# Patient Record
Sex: Female | Born: 1965
Health system: Southern US, Community
[De-identification: ages and names within clinical notes are randomized; demographics above are authoritative.]

## PROBLEM LIST (undated history)

## (undated) DIAGNOSIS — M25569 Pain in unspecified knee: Secondary | ICD-10-CM

## (undated) DIAGNOSIS — G4730 Sleep apnea, unspecified: Secondary | ICD-10-CM

## (undated) DIAGNOSIS — C801 Malignant (primary) neoplasm, unspecified: Secondary | ICD-10-CM

## (undated) DIAGNOSIS — F329 Major depressive disorder, single episode, unspecified: Secondary | ICD-10-CM

## (undated) DIAGNOSIS — K219 Gastro-esophageal reflux disease without esophagitis: Secondary | ICD-10-CM

## (undated) DIAGNOSIS — G4733 Obstructive sleep apnea (adult) (pediatric): Secondary | ICD-10-CM

## (undated) DIAGNOSIS — F32A Depression, unspecified: Secondary | ICD-10-CM

## (undated) DIAGNOSIS — E7800 Pure hypercholesterolemia, unspecified: Secondary | ICD-10-CM

## (undated) DIAGNOSIS — M545 Low back pain, unspecified: Secondary | ICD-10-CM

## (undated) HISTORY — PX: OTHER SURGICAL HISTORY: SHX169

## (undated) HISTORY — DX: Obstructive sleep apnea (adult) (pediatric): G47.33

## (undated) HISTORY — DX: Pure hypercholesterolemia, unspecified: E78.00

## (undated) HISTORY — DX: Pain in unspecified knee: M25.569

## (undated) HISTORY — DX: Sleep apnea, unspecified: G47.30

## (undated) HISTORY — PX: COLONOSCOPY: SHX174

## (undated) HISTORY — DX: Low back pain, unspecified: M54.50

---
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
## 1999-02-07 ENCOUNTER — Other Ambulatory Visit: Admission: RE | Admit: 1999-02-07 | Discharge: 1999-02-07 | Payer: Self-pay | Admitting: Obstetrics and Gynecology

## 1999-08-28 ENCOUNTER — Ambulatory Visit (HOSPITAL_COMMUNITY): Admission: RE | Admit: 1999-08-28 | Discharge: 1999-08-28 | Payer: Self-pay | Admitting: *Deleted

## 1999-08-28 ENCOUNTER — Encounter: Payer: Self-pay | Admitting: *Deleted

## 1999-08-29 ENCOUNTER — Inpatient Hospital Stay (HOSPITAL_COMMUNITY): Admission: AD | Admit: 1999-08-29 | Discharge: 1999-09-01 | Payer: Self-pay | Admitting: Obstetrics and Gynecology

## 1999-09-06 ENCOUNTER — Encounter (HOSPITAL_COMMUNITY): Admission: RE | Admit: 1999-09-06 | Discharge: 1999-12-05 | Payer: Self-pay | Admitting: *Deleted

## 1999-10-11 ENCOUNTER — Other Ambulatory Visit: Admission: RE | Admit: 1999-10-11 | Discharge: 1999-10-11 | Payer: Self-pay | Admitting: Obstetrics and Gynecology

## 2000-01-07 ENCOUNTER — Encounter: Admission: RE | Admit: 2000-01-07 | Discharge: 2000-03-09 | Payer: Self-pay | Admitting: Obstetrics and Gynecology

## 2000-12-30 ENCOUNTER — Ambulatory Visit (HOSPITAL_COMMUNITY): Admission: RE | Admit: 2000-12-30 | Discharge: 2000-12-30 | Payer: Self-pay | Admitting: *Deleted

## 2001-01-24 ENCOUNTER — Other Ambulatory Visit: Admission: RE | Admit: 2001-01-24 | Discharge: 2001-01-24 | Payer: Self-pay | Admitting: Obstetrics and Gynecology

## 2001-03-31 ENCOUNTER — Encounter: Admission: RE | Admit: 2001-03-31 | Discharge: 2001-03-31 | Payer: Self-pay | Admitting: *Deleted

## 2001-07-18 ENCOUNTER — Encounter: Admission: RE | Admit: 2001-07-18 | Discharge: 2001-07-18 | Payer: Self-pay | Admitting: *Deleted

## 2001-08-25 ENCOUNTER — Encounter: Admission: RE | Admit: 2001-08-25 | Discharge: 2001-08-25 | Payer: Self-pay | Admitting: *Deleted

## 2001-09-05 ENCOUNTER — Encounter: Admission: RE | Admit: 2001-09-05 | Discharge: 2001-09-05 | Payer: Self-pay | Admitting: Psychiatry

## 2001-09-12 ENCOUNTER — Encounter: Admission: RE | Admit: 2001-09-12 | Discharge: 2001-09-12 | Payer: Self-pay | Admitting: Psychiatry

## 2001-10-01 ENCOUNTER — Encounter: Admission: RE | Admit: 2001-10-01 | Discharge: 2001-10-01 | Payer: Self-pay | Admitting: Psychiatry

## 2001-10-22 ENCOUNTER — Encounter: Admission: RE | Admit: 2001-10-22 | Discharge: 2001-10-22 | Payer: Self-pay | Admitting: Psychiatry

## 2001-11-13 ENCOUNTER — Encounter: Admission: RE | Admit: 2001-11-13 | Discharge: 2001-11-13 | Payer: Self-pay | Admitting: Psychiatry

## 2001-12-05 ENCOUNTER — Encounter: Admission: RE | Admit: 2001-12-05 | Discharge: 2001-12-05 | Payer: Self-pay | Admitting: Psychiatry

## 2002-03-06 ENCOUNTER — Encounter: Admission: RE | Admit: 2002-03-06 | Discharge: 2002-03-06 | Payer: Self-pay | Admitting: Psychiatry

## 2002-05-20 ENCOUNTER — Other Ambulatory Visit: Admission: RE | Admit: 2002-05-20 | Discharge: 2002-05-20 | Payer: Self-pay | Admitting: Obstetrics and Gynecology

## 2003-03-30 ENCOUNTER — Encounter: Admission: RE | Admit: 2003-03-30 | Discharge: 2003-03-30 | Payer: Self-pay | Admitting: Obstetrics and Gynecology

## 2003-05-11 ENCOUNTER — Other Ambulatory Visit: Admission: RE | Admit: 2003-05-11 | Discharge: 2003-05-11 | Payer: Self-pay | Admitting: Obstetrics and Gynecology

## 2004-03-24 ENCOUNTER — Ambulatory Visit (HOSPITAL_COMMUNITY): Admission: RE | Admit: 2004-03-24 | Discharge: 2004-03-24 | Payer: Self-pay | Admitting: Surgery

## 2004-03-24 ENCOUNTER — Encounter: Admission: RE | Admit: 2004-03-24 | Discharge: 2004-06-22 | Payer: Self-pay | Admitting: Surgery

## 2004-03-26 ENCOUNTER — Ambulatory Visit (HOSPITAL_BASED_OUTPATIENT_CLINIC_OR_DEPARTMENT_OTHER): Admission: RE | Admit: 2004-03-26 | Discharge: 2004-03-26 | Payer: Self-pay | Admitting: Surgery

## 2004-03-29 ENCOUNTER — Ambulatory Visit (HOSPITAL_COMMUNITY): Admission: RE | Admit: 2004-03-29 | Discharge: 2004-03-29 | Payer: Self-pay | Admitting: Internal Medicine

## 2004-04-28 ENCOUNTER — Ambulatory Visit: Payer: Self-pay | Admitting: Internal Medicine

## 2004-05-22 ENCOUNTER — Observation Stay (HOSPITAL_COMMUNITY): Admission: RE | Admit: 2004-05-22 | Discharge: 2004-05-23 | Payer: Self-pay | Admitting: Surgery

## 2004-06-30 ENCOUNTER — Encounter: Admission: RE | Admit: 2004-06-30 | Discharge: 2004-09-28 | Payer: Self-pay | Admitting: Surgery

## 2005-01-31 ENCOUNTER — Other Ambulatory Visit: Admission: RE | Admit: 2005-01-31 | Discharge: 2005-01-31 | Payer: Self-pay | Admitting: Obstetrics and Gynecology

## 2005-05-14 ENCOUNTER — Encounter: Admission: RE | Admit: 2005-05-14 | Discharge: 2005-05-14 | Payer: Self-pay | Admitting: Surgery

## 2006-04-23 HISTORY — PX: LAPAROSCOPIC ROUX-EN-Y GASTRIC BYPASS WITH UPPER ENDOSCOPY AND REMOVAL OF LAP BAND: SHX6505

## 2009-12-15 ENCOUNTER — Ambulatory Visit (HOSPITAL_COMMUNITY): Payer: Self-pay | Admitting: Licensed Clinical Social Worker

## 2009-12-28 ENCOUNTER — Ambulatory Visit (HOSPITAL_COMMUNITY): Payer: Self-pay | Admitting: Licensed Clinical Social Worker

## 2010-01-06 ENCOUNTER — Ambulatory Visit (HOSPITAL_COMMUNITY): Payer: Self-pay | Admitting: Licensed Clinical Social Worker

## 2010-01-13 ENCOUNTER — Ambulatory Visit (HOSPITAL_COMMUNITY): Payer: Self-pay | Admitting: Licensed Clinical Social Worker

## 2010-01-30 ENCOUNTER — Ambulatory Visit (HOSPITAL_COMMUNITY): Payer: Self-pay | Admitting: Licensed Clinical Social Worker

## 2010-02-06 ENCOUNTER — Ambulatory Visit (HOSPITAL_COMMUNITY): Payer: Self-pay | Admitting: Licensed Clinical Social Worker

## 2010-02-27 ENCOUNTER — Ambulatory Visit (HOSPITAL_COMMUNITY): Payer: Self-pay | Admitting: Licensed Clinical Social Worker

## 2010-03-13 ENCOUNTER — Ambulatory Visit (HOSPITAL_COMMUNITY): Payer: Self-pay | Admitting: Licensed Clinical Social Worker

## 2010-03-23 ENCOUNTER — Ambulatory Visit (HOSPITAL_COMMUNITY): Payer: Self-pay | Admitting: Licensed Clinical Social Worker

## 2010-04-12 ENCOUNTER — Ambulatory Visit (HOSPITAL_COMMUNITY): Payer: Self-pay | Admitting: Licensed Clinical Social Worker

## 2010-04-18 ENCOUNTER — Ambulatory Visit (HOSPITAL_COMMUNITY): Payer: Self-pay | Admitting: Licensed Clinical Social Worker

## 2010-05-19 ENCOUNTER — Ambulatory Visit (HOSPITAL_COMMUNITY)
Admission: RE | Admit: 2010-05-19 | Discharge: 2010-05-19 | Payer: Self-pay | Source: Home / Self Care | Attending: Licensed Clinical Social Worker | Admitting: Licensed Clinical Social Worker

## 2010-06-02 ENCOUNTER — Encounter (HOSPITAL_COMMUNITY): Payer: Commercial Managed Care - PPO | Admitting: Licensed Clinical Social Worker

## 2010-06-02 DIAGNOSIS — F332 Major depressive disorder, recurrent severe without psychotic features: Secondary | ICD-10-CM

## 2010-06-12 ENCOUNTER — Encounter (HOSPITAL_COMMUNITY): Payer: Self-pay | Admitting: Licensed Clinical Social Worker

## 2010-06-27 ENCOUNTER — Encounter (HOSPITAL_COMMUNITY): Payer: Self-pay | Admitting: Licensed Clinical Social Worker

## 2010-07-03 ENCOUNTER — Encounter (HOSPITAL_COMMUNITY): Payer: Commercial Managed Care - PPO | Admitting: Licensed Clinical Social Worker

## 2010-07-03 DIAGNOSIS — F332 Major depressive disorder, recurrent severe without psychotic features: Secondary | ICD-10-CM

## 2010-07-17 ENCOUNTER — Encounter (HOSPITAL_COMMUNITY): Payer: Commercial Managed Care - PPO | Admitting: Licensed Clinical Social Worker

## 2010-07-17 DIAGNOSIS — F332 Major depressive disorder, recurrent severe without psychotic features: Secondary | ICD-10-CM

## 2010-08-07 ENCOUNTER — Encounter (HOSPITAL_COMMUNITY): Payer: Commercial Managed Care - PPO | Admitting: Licensed Clinical Social Worker

## 2010-08-07 DIAGNOSIS — F332 Major depressive disorder, recurrent severe without psychotic features: Secondary | ICD-10-CM

## 2010-08-28 ENCOUNTER — Encounter (HOSPITAL_COMMUNITY): Payer: Commercial Managed Care - PPO | Admitting: Licensed Clinical Social Worker

## 2010-08-28 DIAGNOSIS — F332 Major depressive disorder, recurrent severe without psychotic features: Secondary | ICD-10-CM

## 2010-09-08 NOTE — Op Note (Signed)
Aimee, Hart              ACCOUNT NO.:  [id number]   MEDICAL RECORD NO.:  [id number]          PATIENT TYPE:  AMB   LOCATION:  DAY                          FACILITY:  [facility]   PHYSICIAN:  Thornton Park. Daphine Deutscher, MD  DATE OF BIRTH:  Sep 01, 1965   DATE OF PROCEDURE:  05/22/2004  DATE OF DISCHARGE:                                 OPERATIVE REPORT   PREOPERATIVE DIAGNOSES:  Morbid obesity, BMI 36.7 with multiple  comorbidities.   POSTOPERATIVE DIAGNOSES:  Morbid obesity, body mass index of 36.7 with  multiple comorbidities.   PROCEDURE:  Laparoscopic placement of 10 cm adjustable gastric band.   SURGEON:  Thornton Park. Daphine Deutscher, M.D.   ASSISTANT:  Sharlet Salina T. Hoxworth, M.D.   ANESTHESIA:  General endotracheal.   ESTIMATED BLOOD LOSS:  40 cc.   INDICATIONS FOR PROCEDURE:  Aimee Hart is a 45 year old patient taken to  room #1 on May 22, 2004.  Preoperatively, I reviewed her upper GI which  purportedly showed a small hiatal hernia.  This was evaluated and based on  that, I committed to look at this hiatus at the time of surgery.   DESCRIPTION OF PROCEDURE:  Abdomen was entered using an Opti-Vu trocar  technique in the left upper quadrant, then placing the standard 2 trocars in  the right to the midline, a 5 mm in the upper midline for liver retraction  and then 11 to just to the left of the umbilicus.  Also, a 5 was placed on  the left side. First the liver was retracted, then I went up and discovered  the left crus, came down on the left crus and made a little nick in the  fascia, and then did some retrogastric dissection there with the finger  dissector.  Next, I went over and opened the pars flaccida, identified the  right crus and the fat pad moving across it.  I then did a little dissection  on the medial margin of the crus and then using the finger, I was able to  pass up behind the stomach and I came out exactly where I had done my  dissection and this came on  through.   Next, the band was introduced using the band introducer through the 10 mm  port and the little fold of skin above the umbilicus on the right side.  This was then brought into the abdomen and it was placed in the band passer  and brought around, and then passed through the buckle, and then the initial  snap was made.  In the meantime, we had passed the sizing device and had  blown up the 20 cc balloon, and pulled it back to where it was abutting the  EG-junction. It appeared that there was no evident hiatal hernia.  This  maneuver was performed and the area was examined again. There did into  appear to be anything to repair. The balloon was then let down and a  catheter brought back into the mid-esophagus.   Band was then passed around and buckled. At that point, we did pass the tube  back through  and it passed easily.  Three free arm sutures were placed into  the abdomen using a Surgidev, pulling the stomach up over to the small pouch  proximally. These were secured with the tie knots.  A good wrap appeared to  be present.  This last suture was satisfactory, this was away from the  buckle. The catheter tip was then brought out through the right 12 mm port,  this ws enlarged and this was placed onto the stem of the port itself,  passed into the abdomen and then a port was sutured  to the fascia with 4 interrupted 2-0 Prolene sutures. This secured it  nicely. The wound was irrigated. All port sites were injected and closed  with 4-0 Vicryl, Benzoin and Steri-Strips. The patient seemed to tolerate  the procedure well, the patient was taken to the recovery room in  satisfactory condition.      MBM/MEDQ  D:  05/22/2004  T:  05/22/2004  Job:  [id number]

## 2010-09-08 NOTE — Procedures (Signed)
Aimee Hart, Aimee Hart              ACCOUNT NO.:  [id number]   MEDICAL RECORD NO.:  [id number]          PATIENT TYPE:  OUT   LOCATION:  SLEEP CENTER                 [facility]ACILITY[facility]   PHYSICIAN:  Clinton D. Maple Hudson, M.D. DATE OF BIRTH:  03-13-1966   DATE OF STUDY:  03/26/2004                              NOCTURNAL POLYSOMNOGRAM   REFERRING PHYSICIAN:  Luretha Murphy, MD   INDICATION FOR STUDY:  Hypersomnia with sleep apnea.   NECK SIZE:  16.5 inches   BMI:  37   WEIGHT:  231 pounds   SLEEP ARCHITECTURE:  Total sleep time 348.5 minutes with sleep efficiency  75%.  Stage I was 34%, stage II 49%, stages III and IV 17%.  REM was absent.  Latency to sleep onset 27 minutes.  Awake after sleep onset 99 minutes.  Arousal index increased at 52.   RESPIRATORY DATA:  Split study protocol.  RDI 94.9 per hour indicating  severe obstructive sleep apnea/hypopnea syndrome before CPAP.  This included  180 obstructive apneas, 1 central apnea and 80 hypopnea's before CPAP.  The  events were not positional.  REM RDI not applicable.  CPAP was titrated to  16 CWP, RDI 3.4 per hour.  Using a small ResMed UltraMirage full face mask  with heated humidifier.   OXYGEN DATA:  Moderate snoring with oxygen desaturation to a nadir of 80%  before CPAP.  After CPAP control, oxygen saturation held 96% to 98% on room  air.   CARDIAC DATA:  Normal sinus rhythm.   MOVEMENTS/PARASOMNIA:  Occasional leg jerks with insignificant effect on  sleep.  Bactrim x1.   IMPRESSION/RECOMMENDATION:  Severe obstructive sleep apnea/hypopnea  syndrome.  RDI 94.9 per hour with desaturation to 81%.  CPAP titration to 16  CWP, RDI 3.4 per hour.  A small ResMed UltraMirage full face mask was used  with heated humidifier.                                                           Clinton D. Maple Hudson, M.D.  Diplomate, [employer] Board  CDY/MEDQ  D:  04/02/2004 11:49:07  T:  04/02/2004 19:05:18  Job:  [id number]

## 2010-09-22 ENCOUNTER — Encounter (HOSPITAL_COMMUNITY): Payer: Commercial Managed Care - PPO | Admitting: Licensed Clinical Social Worker

## 2010-10-04 ENCOUNTER — Encounter (HOSPITAL_COMMUNITY): Payer: Commercial Managed Care - PPO | Admitting: Licensed Clinical Social Worker

## 2010-10-04 DIAGNOSIS — F332 Major depressive disorder, recurrent severe without psychotic features: Secondary | ICD-10-CM

## 2010-10-20 ENCOUNTER — Encounter (HOSPITAL_COMMUNITY): Payer: Commercial Managed Care - PPO | Admitting: Licensed Clinical Social Worker

## 2010-10-27 ENCOUNTER — Encounter (HOSPITAL_COMMUNITY): Payer: 59 | Admitting: Licensed Clinical Social Worker

## 2010-10-27 DIAGNOSIS — F332 Major depressive disorder, recurrent severe without psychotic features: Secondary | ICD-10-CM

## 2010-11-21 ENCOUNTER — Encounter (HOSPITAL_COMMUNITY): Payer: 59 | Admitting: Licensed Clinical Social Worker

## 2011-03-06 ENCOUNTER — Encounter (HOSPITAL_COMMUNITY): Payer: Self-pay | Admitting: Licensed Clinical Social Worker

## 2013-01-26 ENCOUNTER — Encounter (HOSPITAL_COMMUNITY): Payer: Self-pay | Admitting: Emergency Medicine

## 2013-01-26 ENCOUNTER — Emergency Department (HOSPITAL_COMMUNITY)
Admission: EM | Admit: 2013-01-26 | Discharge: 2013-01-26 | Disposition: A | Discharge status: Home / Self Care | Payer: 59 | Source: Home / Self Care | Attending: Family Medicine | Admitting: Family Medicine

## 2013-01-26 DIAGNOSIS — M26609 Unspecified temporomandibular joint disorder, unspecified side: Secondary | ICD-10-CM

## 2013-01-26 NOTE — ED Provider Notes (Signed)
CSN: [id number]     Arrival date & time 01/26/13  1341 History   First MD Initiated Contact with Patient 01/26/13 1426     Chief Complaint  Patient presents with  . Otalgia   (Consider location/radiation/quality/duration/timing/severity/associated sxs/prior Treatment) Patient is a 47 y.o. female presenting with ear pain. The history is provided by the patient.  Otalgia Location:  Right Behind ear:  No abnormality Quality:  Sharp Severity:  Mild Onset quality:  Gradual Duration:  3 weeks Timing:  Intermittent Chronicity:  New Context comment:  Pain in front of right ear. Exacerbated by: chewing. Associated symptoms: no congestion, no ear discharge, no fever, no hearing loss, no neck pain and no rhinorrhea     History reviewed. No pertinent past medical history. No past surgical history on file. No family history on file. History  Substance Use Topics  . Smoking status: Not on file  . Smokeless tobacco: Not on file  . Alcohol Use: Not on file   OB History   Grav Para Term Preterm Abortions TAB SAB Ect Mult Living                 Review of Systems  Constitutional: Negative.  Negative for fever.  HENT: Positive for ear pain. Negative for hearing loss, congestion, rhinorrhea, neck pain, postnasal drip and ear discharge.     Allergies  Review of patient's allergies indicates no known allergies.  Home Medications  No current outpatient prescriptions on file. BP 117/78  Pulse 83  Temp(Src) 98.8 F (37.1 C) (Oral)  Resp 18  SpO2 96%  LMP 01/23/2013 Physical Exam  Nursing note and vitals reviewed. Constitutional: She appears well-developed and well-nourished.  HENT:  Head: Normocephalic.  Right Ear: External ear normal.  Left Ear: External ear normal.  Mouth/Throat: Oropharynx is clear and moist and mucous membranes are normal.      ED Course  Procedures (including critical care time) Labs Review Labs Reviewed - No data to display Imaging Review No results  found.  MDM      Linna Hoff, MD 01/26/13 [phone]

## 2013-01-26 NOTE — ED Notes (Signed)
C/o right ear pain for three weeks.  Heat was used and lemon juice was used as treatments.  Nodes on the right are sore.

## 2014-09-03 ENCOUNTER — Emergency Department (HOSPITAL_COMMUNITY): Payer: 59

## 2014-09-03 ENCOUNTER — Encounter (HOSPITAL_COMMUNITY): Payer: Self-pay | Admitting: Emergency Medicine

## 2014-09-03 ENCOUNTER — Emergency Department (HOSPITAL_COMMUNITY)
Admission: EM | Admit: 2014-09-03 | Discharge: 2014-09-03 | Disposition: A | Discharge status: Home / Self Care | Payer: 59 | Attending: Emergency Medicine | Admitting: Emergency Medicine

## 2014-09-03 DIAGNOSIS — R0789 Other chest pain: Secondary | ICD-10-CM | POA: Diagnosis not present

## 2014-09-03 DIAGNOSIS — K219 Gastro-esophageal reflux disease without esophagitis: Secondary | ICD-10-CM | POA: Insufficient documentation

## 2014-09-03 DIAGNOSIS — Z79899 Other long term (current) drug therapy: Secondary | ICD-10-CM | POA: Diagnosis not present

## 2014-09-03 DIAGNOSIS — R1030 Lower abdominal pain, unspecified: Secondary | ICD-10-CM | POA: Diagnosis present

## 2014-09-03 HISTORY — DX: Gastro-esophageal reflux disease without esophagitis: K21.9

## 2014-09-03 LAB — COMPREHENSIVE METABOLIC PANEL
ALBUMIN: 4 g/dL (ref 3.5–5.0)
ALK PHOS: 46 U/L (ref 38–126)
ALT: 9 U/L — AB (ref 14–54)
AST: 13 U/L — ABNORMAL LOW (ref 15–41)
Anion gap: 10 (ref 5–15)
BUN: 19 mg/dL (ref 6–20)
CO2: 24 mmol/L (ref 22–32)
CREATININE: 1.09 mg/dL — AB (ref 0.44–1.00)
Calcium: 9.3 mg/dL (ref 8.9–10.3)
Chloride: 105 mmol/L (ref 101–111)
GFR calc non Af Amer: 59 mL/min — ABNORMAL LOW (ref >60)
GLUCOSE: 102 mg/dL — AB (ref 65–99)
POTASSIUM: 4.1 mmol/L (ref 3.5–5.1)
Sodium: 139 mmol/L (ref 135–145)
Total Bilirubin: 0.8 mg/dL (ref 0.3–1.2)
Total Protein: 6.6 g/dL (ref 6.5–8.1)

## 2014-09-03 LAB — CBC WITH DIFFERENTIAL/PLATELET
Basophils Absolute: 0.1 10*3/uL (ref 0.0–0.1)
Basophils Relative: 1 % (ref 0–1)
EOS ABS: 0.3 10*3/uL (ref 0.0–0.7)
EOS PCT: 6 % — AB (ref 0–5)
HCT: 39.9 % (ref 36.0–46.0)
Hemoglobin: 13.2 g/dL (ref 12.0–15.0)
Lymphocytes Relative: 28 % (ref 12–46)
Lymphs Abs: 1.5 10*3/uL (ref 0.7–4.0)
MCH: 27 pg (ref 26.0–34.0)
MCHC: 33.1 g/dL (ref 30.0–36.0)
MCV: 81.8 fL (ref 78.0–100.0)
Monocytes Absolute: 0.4 10*3/uL (ref 0.1–1.0)
Monocytes Relative: 8 % (ref 3–12)
Neutro Abs: 3 10*3/uL (ref 1.7–7.7)
Neutrophils Relative %: 57 % (ref 43–77)
PLATELETS: 275 10*3/uL (ref 150–400)
RBC: 4.88 MIL/uL (ref 3.87–5.11)
RDW: 13.4 % (ref 11.5–15.5)
WBC: 5.2 10*3/uL (ref 4.0–10.5)

## 2014-09-03 LAB — URINALYSIS, ROUTINE W REFLEX MICROSCOPIC
Bilirubin Urine: NEGATIVE
Glucose, UA: NEGATIVE mg/dL
Hgb urine dipstick: NEGATIVE
Ketones, ur: NEGATIVE mg/dL
Leukocytes, UA: NEGATIVE
Nitrite: NEGATIVE
Protein, ur: NEGATIVE mg/dL
Specific Gravity, Urine: 1.019 (ref 1.005–1.030)
Urobilinogen, UA: 0.2 mg/dL (ref 0.0–1.0)
pH: 5.5 (ref 5.0–8.0)

## 2014-09-03 LAB — LIPASE, BLOOD: Lipase: 31 U/L (ref 22–51)

## 2014-09-03 NOTE — Discharge Instructions (Signed)
Chest Wall Pain Chest wall pain is pain in or around the bones and muscles of your chest. It may take up to 6 weeks to get better. It may take longer if you must stay physically active in your work and activities.  CAUSES  Chest wall pain may happen on its own. However, it may be caused by:  A viral illness like the flu.  Injury.  Coughing.  Exercise.  Arthritis.  Fibromyalgia.  Shingles. HOME CARE INSTRUCTIONS   Avoid overtiring physical activity. Try not to strain or perform activities that cause pain. This includes any activities using your chest or your abdominal and side muscles, especially if heavy weights are used.  Put ice on the sore area.  Put ice in a plastic bag.  Place a towel between your skin and the bag.  Leave the ice on for 15-20 minutes per hour while awake for the first 2 days.  Only take over-the-counter or prescription medicines for pain, discomfort, or fever as directed by your caregiver. SEEK IMMEDIATE MEDICAL CARE IF:   Your pain increases, or you are very uncomfortable.  You have a fever.  Your chest pain becomes worse.  You have new, unexplained symptoms.  You have nausea or vomiting.  You feel sweaty or lightheaded.  You have a cough with phlegm (sputum), or you cough up blood. MAKE SURE YOU:   Understand these instructions.  Will watch your condition.  Will get help right away if you are not doing well or get worse. Document Released: 04/09/2005 Document Revised: 07/02/2011 Document Reviewed: 12/04/2010 [facility] Patient Information 2015 [place], [place]. This information is not intended to replace advice given to you by your health care provider. Make sure you discuss any questions you have with your health care provider.  Please monitor for new or worsening signs or symptoms, return to the emergency room if any present. Please follow-up to primary care provider if symptoms continue to persist. Please use ibuprofen 600 3 times daily  for 5 days as needed for pain.

## 2014-09-03 NOTE — ED Notes (Signed)
PA at bedside.

## 2014-09-03 NOTE — ED Notes (Addendum)
Pt transported to Xray. 

## 2014-09-03 NOTE — ED Provider Notes (Signed)
CSN: [id number]     Arrival date & time 09/03/14  0756 History   None    Chief Complaint  Patient presents with  . Abdominal Pain   HPI   49 year old female presents today with pain to her right inferior rib and flank area. Patient reports the pain started on Wednesday and was not associated with any activity or event. She describes it as "achy" with some radiation towards her epigastric area. Patient denies aggravating or relieving factors including deep inspiration or positioning. Patient denies nausea, vomiting, diarrhea, chest pain, shortness of breath, diaphoresis, additional abdominal pain, changes in her urinary or bowel frequency or characteristics, lower extremity swelling or edema, recent surgeries, prolonged immobilization, estrogen use, coagulopathies, smoking.  Past Medical History  Diagnosis Date  . GERD (gastroesophageal reflux disease)    History reviewed. No pertinent past surgical history. History reviewed. No pertinent family history. History  Substance Use Topics  . Smoking status: Not on file  . Smokeless tobacco: Not on file  . Alcohol Use: Not on file   OB History    No data available     Review of Systems  All other systems reviewed and are negative.   Allergies  Review of patient's allergies indicates no known allergies.  Home Medications   Prior to Admission medications   Medication Sig Start Date End Date Taking? Authorizing Provider  buPROPion (WELLBUTRIN XL) 300 MG 24 hr tablet Take 300 mg by mouth daily.   Yes Historical Provider, MD  escitalopram (LEXAPRO) 20 MG tablet Take 30 mg by mouth daily.   Yes Historical Provider, MD  ibuprofen (ADVIL,MOTRIN) 200 MG tablet Take 600 mg by mouth every 6 (six) hours as needed for headache, mild pain or moderate pain.   Yes Historical Provider, MD  omeprazole (PRILOSEC OTC) 20 MG tablet Take 20 mg by mouth daily.   Yes Historical Provider, MD   BP 141/83 mmHg  Pulse 72  Temp(Src) 98.3 F (36.8 C) (Oral)   Resp 16  SpO2 99%  LMP 07/24/2014 Physical Exam  Constitutional: She is oriented to person, place, and time. She appears well-developed and well-nourished.  HENT:  Head: Normocephalic and atraumatic.  Eyes: Pupils are equal, round, and reactive to light.  Neck: Normal range of motion. Neck supple. No JVD present. No tracheal deviation present. No thyromegaly present.  Cardiovascular: Normal rate, regular rhythm, normal heart sounds and intact distal pulses.  Exam reveals no gallop and no friction rub.   No murmur heard. Pulmonary/Chest: Effort normal and breath sounds normal. No stridor. No respiratory distress. She has no wheezes. She has no rales. She exhibits no tenderness.  Nontender to palpation of chest wall  Abdominal: There is no hepatosplenomegaly, splenomegaly or hepatomegaly. There is no tenderness. There is no rigidity, no rebound, no guarding, no CVA tenderness, no tenderness at McBurney's point and negative Murphy's sign.  Mass in right lower quadrant, status post gastric banding  Musculoskeletal: Normal range of motion.  Lymphadenopathy:    She has no cervical adenopathy.  Neurological: She is alert and oriented to person, place, and time. Coordination normal.  Skin: Skin is warm and dry.  Psychiatric: She has a normal mood and affect. Her behavior is normal. Judgment and thought content normal.  Nursing note and vitals reviewed.   ED Course  Procedures (including critical care time) Labs Review Labs Reviewed  CBC WITH DIFFERENTIAL/PLATELET - Abnormal; Notable for the following:    Eosinophils Relative 6 (*)    All other components  within normal limits  COMPREHENSIVE METABOLIC PANEL - Abnormal; Notable for the following:    Glucose, Bld 102 (*)    Creatinine, Ser 1.09 (*)    AST 13 (*)    ALT 9 (*)    GFR calc non Af Amer 59 (*)    All other components within normal limits  URINALYSIS, ROUTINE W REFLEX MICROSCOPIC - Abnormal; Notable for the following:     APPearance CLOUDY (*)    All other components within normal limits  LIPASE, BLOOD    Imaging Review No results found.   EKG Interpretation None      MDM   Final diagnoses:  Right-sided chest wall pain    Labs: Urinalysis, CBC, CMP, lipase- noncontributory  Imaging: DG chest no active cardiopulmonary disease  Consults: None  Therapeutics: None  Assessment: Chest wall pain  Plan: Patient presents with chest wall pain. Patient is nontender to palpation to the abdomen, and likely referred pain. Patient is perk negative. She remained stable throughout her stay was not able to reproduce pain, she was not tachycardic, hypoxic, acute neck and had no fever. This is unlikely cardiopulmonary related. She was instructed to use ibuprofen for discomfort and encouraged to monitor for new or worsening signs or symptoms. She is encouraged follow-up immediately in the emergency room if any present. Follow-up with primary care for further evaluation and management of symptoms persist. Patient verbalized understanding to today's plan and had no further questions, and at time of discharge.       Okey Regal, PA-C 09/05/14 [place], DO 09/08/14 1110

## 2014-09-03 NOTE — ED Notes (Signed)
Pt with Hx of GERD c/o right side pain radiating to right flank and right epigastric area onset Wednesday. Pt denies n/v/diarrhea.

## 2015-04-29 DIAGNOSIS — Z01419 Encounter for gynecological examination (general) (routine) without abnormal findings: Secondary | ICD-10-CM | POA: Diagnosis not present

## 2015-04-29 DIAGNOSIS — Z1231 Encounter for screening mammogram for malignant neoplasm of breast: Secondary | ICD-10-CM | POA: Diagnosis not present

## 2015-04-29 DIAGNOSIS — Z6830 Body mass index (BMI) 30.0-30.9, adult: Secondary | ICD-10-CM | POA: Diagnosis not present

## 2015-05-24 DIAGNOSIS — F339 Major depressive disorder, recurrent, unspecified: Secondary | ICD-10-CM | POA: Diagnosis not present

## 2015-05-26 MED FILL — ESCITALOPRAM 20 MG TABLET: 20 | 30 days supply | Qty: 60 | Fill #2

## 2015-06-30 MED FILL — BUPROPION HCL XL 150 MG TAB: 150 | 90 days supply | Qty: 270 | Fill #0

## 2015-06-30 MED FILL — ESCITALOPRAM 20 MG TABLET: 20 | 90 days supply | Qty: 180 | Fill #0

## 2015-07-06 MED FILL — DEXILANT DR 60 MG CAPSULE: 60 | 90 days supply | Qty: 90 | Fill #1

## 2015-10-05 DIAGNOSIS — F339 Major depressive disorder, recurrent, unspecified: Secondary | ICD-10-CM | POA: Diagnosis not present

## 2015-10-06 MED FILL — DEXILANT DR 60 MG CAPSULE: 60 | 60 days supply | Qty: 60 | Fill #2

## 2015-10-07 MED FILL — ALPRAZolam 0.5 MG TABS: 0.5 | 15 days supply | Qty: 60 | Fill #0

## 2015-10-24 DIAGNOSIS — L219 Seborrheic dermatitis, unspecified: Secondary | ICD-10-CM | POA: Diagnosis not present

## 2015-10-24 DIAGNOSIS — L719 Rosacea, unspecified: Secondary | ICD-10-CM | POA: Diagnosis not present

## 2015-10-24 MED FILL — metroNIDAZOLE 0.75 % GEL: 0.75 | 14 days supply | Qty: 45 | Fill #0

## 2015-10-24 MED FILL — FLUOCINONIDE 0.05% SOLUTION: 0.05 | 30 days supply | Qty: 60 | Fill #0

## 2015-11-07 MED FILL — BUPROPION HCL XL 150 MG TAB: 150 | 90 days supply | Qty: 270 | Fill #0

## 2015-11-07 MED FILL — ESCITALOPRAM 20 MG TABLET: 20 | 90 days supply | Qty: 180 | Fill #0

## 2015-11-16 DIAGNOSIS — F339 Major depressive disorder, recurrent, unspecified: Secondary | ICD-10-CM | POA: Diagnosis not present

## 2015-12-08 MED FILL — DEXILANT DR 60 MG CAPSULE: 60 | 30 days supply | Qty: 30 | Fill #0

## 2016-01-26 DIAGNOSIS — K5904 Chronic idiopathic constipation: Secondary | ICD-10-CM | POA: Diagnosis not present

## 2016-01-26 DIAGNOSIS — E669 Obesity, unspecified: Secondary | ICD-10-CM | POA: Diagnosis not present

## 2016-01-26 DIAGNOSIS — K219 Gastro-esophageal reflux disease without esophagitis: Secondary | ICD-10-CM | POA: Diagnosis not present

## 2016-01-26 DIAGNOSIS — K5730 Diverticulosis of large intestine without perforation or abscess without bleeding: Secondary | ICD-10-CM | POA: Diagnosis not present

## 2016-02-22 MED FILL — DEXILANT DR 60 MG CAPSULE: 60 | 90 days supply | Qty: 90 | Fill #0

## 2016-02-23 MED FILL — ALPRAZolam 0.5 MG TABS: 0.5 | 15 days supply | Qty: 60 | Fill #0

## 2016-03-08 DIAGNOSIS — F339 Major depressive disorder, recurrent, unspecified: Secondary | ICD-10-CM | POA: Diagnosis not present

## 2016-03-19 MED FILL — BUPROPION HCL XL 150 MG TAB: 150 | 90 days supply | Qty: 270 | Fill #1

## 2016-04-17 MED FILL — ESCITALOPRAM 20 MG TABLET: 20 | 90 days supply | Qty: 180 | Fill #1

## 2016-05-22 DIAGNOSIS — Z6832 Body mass index (BMI) 32.0-32.9, adult: Secondary | ICD-10-CM | POA: Diagnosis not present

## 2016-05-22 DIAGNOSIS — Z1231 Encounter for screening mammogram for malignant neoplasm of breast: Secondary | ICD-10-CM | POA: Diagnosis not present

## 2016-05-22 DIAGNOSIS — Z01419 Encounter for gynecological examination (general) (routine) without abnormal findings: Secondary | ICD-10-CM | POA: Diagnosis not present

## 2016-07-17 MED FILL — ESCITALOPRAM 20 MG TABLET: 20 | 90 days supply | Qty: 180 | Fill #0

## 2016-07-17 MED FILL — BUPROPION XL 150 MG TAB: 150 | 90 days supply | Qty: 270 | Fill #0

## 2016-10-10 DIAGNOSIS — F339 Major depressive disorder, recurrent, unspecified: Secondary | ICD-10-CM | POA: Diagnosis not present

## 2016-12-04 MED FILL — buPROPion HCL ER (XL) 300 M: 300 | 90 days supply | Qty: 90 | Fill #0

## 2016-12-04 MED FILL — ALPRAZolam 0.5 MG TABS: 0.5 | 15 days supply | Qty: 60 | Fill #0

## 2016-12-04 MED FILL — ESCITALOPRAM 20 MG TABLET: 20 | 90 days supply | Qty: 180 | Fill #0

## 2017-01-11 DIAGNOSIS — J3489 Other specified disorders of nose and nasal sinuses: Secondary | ICD-10-CM | POA: Diagnosis not present

## 2017-03-20 MED FILL — BUPROPION HCL XL 150 MG TAB: 150 | 90 days supply | Qty: 270 | Fill #0

## 2017-04-22 MED FILL — ESCITALOPRAM 20 MG TABLET: 20 | 90 days supply | Qty: 180 | Fill #1

## 2017-05-02 DIAGNOSIS — F339 Major depressive disorder, recurrent, unspecified: Secondary | ICD-10-CM | POA: Diagnosis not present

## 2017-06-04 DIAGNOSIS — Z01419 Encounter for gynecological examination (general) (routine) without abnormal findings: Secondary | ICD-10-CM | POA: Diagnosis not present

## 2017-06-04 DIAGNOSIS — Z6831 Body mass index (BMI) 31.0-31.9, adult: Secondary | ICD-10-CM | POA: Diagnosis not present

## 2017-06-04 DIAGNOSIS — Z1231 Encounter for screening mammogram for malignant neoplasm of breast: Secondary | ICD-10-CM | POA: Diagnosis not present

## 2017-06-10 DIAGNOSIS — Z13220 Encounter for screening for lipoid disorders: Secondary | ICD-10-CM | POA: Diagnosis not present

## 2017-06-10 DIAGNOSIS — Z13228 Encounter for screening for other metabolic disorders: Secondary | ICD-10-CM | POA: Diagnosis not present

## 2017-06-10 DIAGNOSIS — Z1329 Encounter for screening for other suspected endocrine disorder: Secondary | ICD-10-CM | POA: Diagnosis not present

## 2017-06-10 DIAGNOSIS — Z1321 Encounter for screening for nutritional disorder: Secondary | ICD-10-CM | POA: Diagnosis not present

## 2017-06-11 MED FILL — VIT D2 1.25 MG (50,000 UNIT: 1.25 MG | 42 days supply | Qty: 6 | Fill #0

## 2017-08-26 MED FILL — buPROPion HCL ER (XL) 150 M: 150 | 90 days supply | Qty: 270 | Fill #1

## 2017-08-27 MED FILL — ESCITALOPRAM 20 MG TABLET: 20 | 90 days supply | Qty: 180 | Fill #0

## 2017-12-26 DIAGNOSIS — F339 Major depressive disorder, recurrent, unspecified: Secondary | ICD-10-CM | POA: Diagnosis not present

## 2018-01-01 MED FILL — TRINTELLIX 10 MG TABLET: 10 | 30 days supply | Qty: 30 | Fill #0

## 2018-01-01 MED FILL — buPROPion HCL ER (XL) 300 M: 300 | 90 days supply | Qty: 90 | Fill #0

## 2018-01-01 MED FILL — ALPRAZolam 0.5 MG TABS: 0.5 | 30 days supply | Qty: 60 | Fill #0

## 2018-01-14 ENCOUNTER — Emergency Department: Payer: 59

## 2018-01-14 ENCOUNTER — Emergency Department
Admission: EM | Admit: 2018-01-14 | Discharge: 2018-01-14 | Disposition: A | Discharge status: Home / Self Care | Payer: 59 | Attending: Emergency Medicine | Admitting: Emergency Medicine

## 2018-01-14 ENCOUNTER — Encounter: Payer: Self-pay | Admitting: Medical Oncology

## 2018-01-14 DIAGNOSIS — M7918 Myalgia, other site: Secondary | ICD-10-CM | POA: Diagnosis present

## 2018-01-14 DIAGNOSIS — R0602 Shortness of breath: Secondary | ICD-10-CM | POA: Diagnosis not present

## 2018-01-14 DIAGNOSIS — B349 Viral infection, unspecified: Secondary | ICD-10-CM | POA: Insufficient documentation

## 2018-01-14 DIAGNOSIS — R5383 Other fatigue: Secondary | ICD-10-CM | POA: Insufficient documentation

## 2018-01-14 DIAGNOSIS — Z79899 Other long term (current) drug therapy: Secondary | ICD-10-CM | POA: Diagnosis not present

## 2018-01-14 HISTORY — DX: Depression, unspecified: F32.A

## 2018-01-14 HISTORY — DX: Major depressive disorder, single episode, unspecified: F32.9

## 2018-01-14 LAB — CBC
HCT: 40.9 % (ref 35.0–47.0)
Hemoglobin: 13.9 g/dL (ref 12.0–16.0)
MCH: 27.5 pg (ref 26.0–34.0)
MCHC: 33.9 g/dL (ref 32.0–36.0)
MCV: 81.3 fL (ref 80.0–100.0)
Platelets: 298 10*3/uL (ref 150–440)
RBC: 5.03 MIL/uL (ref 3.80–5.20)
RDW: 14.3 % (ref 11.5–14.5)
WBC: 11 10*3/uL (ref 3.6–11.0)

## 2018-01-14 LAB — FIBRIN DERIVATIVES D-DIMER (ARMC ONLY): Fibrin derivatives D-dimer (ARMC): 358.01 ng/mL (FEU) (ref 0.00–499.00)

## 2018-01-14 LAB — COMPREHENSIVE METABOLIC PANEL
ALK PHOS: 52 U/L (ref 38–126)
ALT: 10 U/L (ref 0–44)
ANION GAP: 7 (ref 5–15)
AST: 17 U/L (ref 15–41)
Albumin: 4.3 g/dL (ref 3.5–5.0)
BUN: 19 mg/dL (ref 6–20)
CALCIUM: 8.6 mg/dL — AB (ref 8.9–10.3)
CO2: 24 mmol/L (ref 22–32)
Chloride: 106 mmol/L (ref 98–111)
Creatinine, Ser: 0.91 mg/dL (ref 0.44–1.00)
GFR calc non Af Amer: >60 mL/min (ref >60)
Glucose, Bld: 99 mg/dL (ref 70–99)
POTASSIUM: 3.9 mmol/L (ref 3.5–5.1)
Sodium: 137 mmol/L (ref 135–145)
Total Bilirubin: 0.6 mg/dL (ref 0.3–1.2)
Total Protein: 7.5 g/dL (ref 6.5–8.1)

## 2018-01-14 LAB — INFLUENZA PANEL BY PCR (TYPE A & B)
INFLAPCR: NEGATIVE
INFLBPCR: NEGATIVE

## 2018-01-14 LAB — TROPONIN I: Troponin I: <0.03 ng/mL (ref <0.03)

## 2018-01-14 MED ORDER — IBUPROFEN 400 MG PO TABS
400.0000 mg | ORAL_TABLET | Freq: Once | ORAL | Status: AC
  Administered 2018-01-14: 400 mg via ORAL
  Filled 2018-01-14: qty 1

## 2018-01-14 NOTE — ED Triage Notes (Signed)
Pt reports that she woke up this am around 0400 with sob. States that she feels like she cant get a deep breath. Pt denies pain, states that she just flew back from [facility] yesterday. Pt also reports body aches and chills.

## 2018-01-14 NOTE — ED Notes (Signed)
Patient transported to X-ray 

## 2018-01-14 NOTE — ED Notes (Signed)
ED Provider at bedside. 

## 2018-01-14 NOTE — ED Provider Notes (Signed)
[facility] Emergency Department Provider Note   ____________________________________________    I have reviewed the triage vital signs and the nursing notes.   HISTORY  Chief Complaint Shortness of Breath     HPI Aimee Hart is a 52 y.o. female who presents with complaints of body aches, shortness of breath and fatigue.  Patient notes that she woke up this morning at 4 AM to go to the bathroom, felt that she had a hard time catching her breath and that her breathing may have been faster than normal, initially concerned that it may be anxiety took a Xanax without relief.  Recently returned from [place], no international travel.  While in [place] had an episode when she became nauseated, dizzy and fatigued which passed relatively quickly.  This morning she tried to go to work but felt very fatigued and decided to come to the emergency department.  She complains of significant myalgias.  No significant cough.  No chest pain.  No calf pain or swelling.  She has received her flu shot.  She does not smoke   Past Medical History:  Diagnosis Date  . Depression   . GERD (gastroesophageal reflux disease)     There are no active problems to display for this patient.   No past surgical history on file.  Prior to Admission medications   Medication Sig Start Date End Date Taking? Authorizing Provider  ALPRAZolam Duanne Moron) 0.5 MG tablet Take 0.5 mg by mouth 2 (two) times daily as needed for anxiety. 01/01/18  Yes [provider]  buPROPion (WELLBUTRIN XL) 300 MG 24 hr tablet Take 300 mg by mouth daily.   Yes [provider]  omeprazole (PRILOSEC OTC) 20 MG tablet Take 20 mg by mouth daily.   Yes [provider]  TRINTELLIX 10 MG TABS tablet Take 10 mg by mouth daily. 01/01/18  Yes [provider]  escitalopram (LEXAPRO) 20 MG tablet Take 30 mg by mouth daily.    [provider]  ibuprofen (ADVIL,MOTRIN) 200 MG tablet  Take 600 mg by mouth every 6 (six) hours as needed for headache, mild pain or moderate pain.    [provider]     Allergies Patient has no known allergies.  No family history on file.  Social History Social History   Tobacco Use  . Smoking status: Not on file  Substance Use Topics  . Alcohol use: Not on file  . Drug use: Not on file    Review of Systems  Constitutional: No fever/chills Eyes: No visual changes.  ENT: No sore throat. Cardiovascular: Denies chest pain. Respiratory: As above Gastrointestinal: No abdominal pain Genitourinary: Negative for dysuria. Musculoskeletal: Myalgias as above Skin: Negative for rash. Neurological: Negative for headaches   ____________________________________________   PHYSICAL EXAM:  VITAL SIGNS: ED Triage Vitals  Enc Vitals Group     BP 01/14/18 0713 119/70     Pulse Rate 01/14/18 0713 100     Resp 01/14/18 0713 18     Temp 01/14/18 0713 97.7 F (36.5 C)     Temp Source 01/14/18 0713 Oral     SpO2 01/14/18 0713 100 %     Weight 01/14/18 0714 85.3 kg (188 lb)     Height 01/14/18 0714 1.651 m (5\' 5" )     Head Circumference --      Peak Flow --      Pain Score 01/14/18 0713 0     Pain Loc --  Pain Edu? --      Excl. in [place]? --     Constitutional: Alert and oriented. No acute distress. Pleasant and interactive Eyes: Conjunctivae are normal.   Nose: No congestion/rhinnorhea. Mouth/Throat: Mucous membranes are moist.   Neck:  Painless ROM Cardiovascular: Normal rate, regular rhythm. Grossly normal heart sounds.  Good peripheral circulation. Respiratory: Normal respiratory effort.  No retractions. Lungs CTAB. Gastrointestinal: No distention.    Musculoskeletal: No lower extremity tenderness nor edema.  Warm and well perfused Neurologic:  Normal speech and language. No gross focal neurologic deficits are appreciated.  Skin:  Skin is warm, dry and intact. No rash noted. Psychiatric: Mood and affect are  normal. Speech and behavior are normal.  ____________________________________________   LABS (all labs ordered are listed, but only abnormal results are displayed)  Labs Reviewed  COMPREHENSIVE METABOLIC PANEL - Abnormal; Notable for the following components:      Result Value   Calcium 8.6 (*)    All other components within normal limits  CBC  TROPONIN I  INFLUENZA PANEL BY PCR (TYPE A & B)  FIBRIN DERIVATIVES D-DIMER ([facility] ONLY)   ____________________________________________  EKG  ED ECG REPORT I, Lavonia Drafts, the attending physician, personally viewed and interpreted this ECG.  Date: 01/14/2018  Rhythm: normal sinus rhythm QRS Axis: normal Intervals: normal ST/T Wave abnormalities: normal Narrative Interpretation: no evidence of acute ischemia  ____________________________________________  RADIOLOGY  Chest x-ray normal ____________________________________________   PROCEDURES  Procedure(s) performed: No  Procedures   Critical Care performed: No ____________________________________________   INITIAL IMPRESSION / ASSESSMENT AND PLAN / ED COURSE  Pertinent labs & imaging results that were available during my care of the patient were reviewed by me and considered in my medical decision making (see chart for details).  Patient presents with primary complaint of myalgias, fatigue, possibly some shortness of breath.  Recent short plane trip, differential does include PE however unlikely given main complaint appears to be myalgias and fatigue suggesting possible viral illness/influenza.  Pneumonia is also possibility.  Pending labs including troponin, chest x-ray, influenza, d-dimer  Chest x-ray does not demonstrate a pneumonia.  Lab work is reassuring, normal white blood cell count, normal troponin.  Influenza is negative, pending d-dimer  ----------------------------------------- 10:22 AM on 01/14/2018 -----------------------------------------  D-dimer  is normal.  Patient reports that her myalgias have resolved after Motrin and her breathing is normal and at baseline.  We discussed close outpatient follow-up and strict return precautions ____________________________________________   FINAL CLINICAL IMPRESSION(S) / ED DIAGNOSES  Final diagnoses:  SOB (shortness of breath)  Viral illness        Note:  This document was prepared using Dragon voice recognition software and may include unintentional dictation errors.    Lavonia Drafts, MD 01/14/18 1023

## 2018-01-27 DIAGNOSIS — F411 Generalized anxiety disorder: Secondary | ICD-10-CM | POA: Insufficient documentation

## 2018-01-27 DIAGNOSIS — F339 Major depressive disorder, recurrent, unspecified: Secondary | ICD-10-CM | POA: Insufficient documentation

## 2018-02-06 ENCOUNTER — Ambulatory Visit (INDEPENDENT_AMBULATORY_CARE_PROVIDER_SITE_OTHER): Payer: 59 | Admitting: Physician Assistant

## 2018-02-06 ENCOUNTER — Encounter: Payer: Self-pay | Admitting: Physician Assistant

## 2018-02-06 DIAGNOSIS — F331 Major depressive disorder, recurrent, moderate: Secondary | ICD-10-CM | POA: Diagnosis not present

## 2018-02-06 DIAGNOSIS — F411 Generalized anxiety disorder: Secondary | ICD-10-CM | POA: Diagnosis not present

## 2018-02-06 MED ORDER — TRINTELLIX 10 MG PO TABS
10.0000 mg | ORAL_TABLET | Freq: Every day | ORAL | 0 refills | Status: DC
Start: 2018-02-06 — End: 2018-06-11

## 2018-02-06 NOTE — Progress Notes (Signed)
[facility]  Patient ID: Aimee Hart,  MRN: [id number]  PCP: Patient, No Pcp Per  Date of Evaluation: 02/06/2018 Time spent:15 minutes   HISTORY/CURRENT STATUS: HPI Aimee Hart is here for a 6-week medication check.  At the last visit in early September we changed Lexapro to Trintellix.  She states she is doing really well and had no trouble during the transition. Denies anhedonia, decreased energy or motivation, not isolating.  Not crying easily. she sleeps well.  He does wake up occasionally but thinks that is probably related to menopause with some hot flashes every once in a while.  Sleeps good now but occasionally wakes up.  Thinks that's probably menopause. Anxiety is well controlled.  She has to take Xanax may be once or twice a week, which is helpful. Work is going well however she is looking for something that is more suitable to her needs right now.  She is an Therapist, sports. She has recently bought a house and moved in this week.  Is going well.  She is also dating a new guy is very happy with that. The Trintellix was $200 even with the co-pay card and her insurance.  States she is willing to pay that because she feels so much better on it but wonders if there is anything else that can be done to get the cost down.   Individual Medical History/ Review of Systems: Changes? :No  Allergies: Patient has no known allergies.  Current Medications:  Current Outpatient Medications:  .  ALPRAZolam (XANAX) 0.5 MG tablet, Take 0.5 mg by mouth 2 (two) times daily as needed for anxiety., Disp: , Rfl: 1 .  buPROPion (WELLBUTRIN XL) 300 MG 24 hr tablet, Take 300 mg by mouth every morning. , Disp: , Rfl:  .  ibuprofen (ADVIL,MOTRIN) 200 MG tablet, Take 600 mg by mouth every 6 (six) hours as needed for headache, mild pain or moderate pain., Disp: , Rfl:  .  omeprazole (PRILOSEC OTC) 20 MG tablet, Take 20 mg by mouth daily., Disp: , Rfl:  .  TRINTELLIX 10 MG TABS tablet, Take 1 tablet (10 mg  total) by mouth daily., Disp: 90 tablet, Rfl: 0 Medication Side Effects: None    Family Medical/ Social History: Changes?  has moved into a new home MENTAL HEALTH EXAM:  There were no vitals taken for this visit.There is no height or weight on file to calculate BMI.  General Appearance: Well Groomed  Eye Contact:  Good  Speech:  Clear and Coherent  Volume:  Normal  Mood:  Euthymic  Affect:  Appropriate  Thought Process:  Goal Directed  Orientation:  Full (Time, Place, and Person)  Thought Content: Logical   Suicidal Thoughts:  No  Homicidal Thoughts:  No  Memory:  Immediate  Judgement:  Good  Insight:  Good  Psychomotor Activity:  Normal  Concentration:  Concentration: Good  Recall:  Good  Fund of Knowledge: Good  Language: Good  Akathisia:  No  AIMS (if indicated): not done  Assets:  Desire for Improvement  ADL's:  Intact  Cognition: WNL  Prognosis:  Good    DIAGNOSES:    ICD-10-CM   1. Major depressive disorder, recurrent episode, moderate (HCC) F33.1   2. Generalized anxiety disorder F41.1     RECOMMENDATIONS: Glad to see her doing so well! Continue Trintellix 10 mg daily.  As far as the cost goes, I have asked her to discuss with the pharmacist whether prior authorization would be helpful.  As  far as I know, our office was not sent anything for [place] to complete.  However, the issue may be insurance, high deductible, high tier of the drug or other factors that I am not aware of.  She understands. Continue Wellbutrin XL 300 mg every morning. Continue Xanax 0.5 mg twice daily as needed. Return in 3 months or sooner as needed    Donnal Moat, PA-C

## 2018-02-25 MED FILL — TRINTELLIX 10 MG TABLET: 10 | 90 days supply | Qty: 90 | Fill #0

## 2018-04-17 ENCOUNTER — Ambulatory Visit: Payer: Self-pay | Admitting: Family

## 2018-04-24 ENCOUNTER — Ambulatory Visit: Payer: Self-pay | Admitting: Family Medicine

## 2018-04-24 MED FILL — buPROPion HCL ER (XL) 300 M: 300 | 90 days supply | Qty: 90 | Fill #1

## 2018-05-05 ENCOUNTER — Encounter: Payer: Self-pay | Admitting: Physician Assistant

## 2018-05-05 ENCOUNTER — Ambulatory Visit (INDEPENDENT_AMBULATORY_CARE_PROVIDER_SITE_OTHER): Payer: 59 | Admitting: Physician Assistant

## 2018-05-05 DIAGNOSIS — F411 Generalized anxiety disorder: Secondary | ICD-10-CM | POA: Diagnosis not present

## 2018-05-05 DIAGNOSIS — F331 Major depressive disorder, recurrent, moderate: Secondary | ICD-10-CM | POA: Diagnosis not present

## 2018-05-05 NOTE — Progress Notes (Signed)
[facility]  Patient ID: Aimee Hart,  MRN: [id number]  PCP: Patient, No Pcp Per  Date of Evaluation: 05/05/2018 Time spent:15 minutes  Chief Complaint:   HISTORY/CURRENT STATUS: HPI For 3 month med check.   Doing really well.  Feels that her medications are working great. Patient denies loss of interest in usual activities and is able to enjoy things.  Denies decreased energy or motivation.  Appetite has not changed.  No extreme sadness, tearfulness, or feelings of hopelessness.  Denies any changes in concentration, making decisions or remembering things.  Denies suicidal or homicidal thoughts.  She is dating a new guy and they went to [place] recently and had a great time.  She is very happy.  Anxiety is well controlled.  She sleeps well most of the time.  Individual Medical History/ Review of Systems: Changes? :No    Past medications for mental health diagnoses include: Zoloft, Prozac, Paxil, Abilify, Effexor, Celexa, Cymbalta, Lamictal  Allergies: Patient has no known allergies.  Current Medications:  Current Outpatient Medications:  .  ALPRAZolam (XANAX) 0.5 MG tablet, Take 0.5 mg by mouth 2 (two) times daily as needed for anxiety., Disp: , Rfl: 1 .  buPROPion (WELLBUTRIN XL) 300 MG 24 hr tablet, Take 300 mg by mouth every morning. , Disp: , Rfl:  .  ibuprofen (ADVIL,MOTRIN) 200 MG tablet, Take 600 mg by mouth every 6 (six) hours as needed for headache, mild pain or moderate pain., Disp: , Rfl:  .  omeprazole (PRILOSEC OTC) 20 MG tablet, Take 20 mg by mouth daily., Disp: , Rfl:  .  TRINTELLIX 10 MG TABS tablet, Take 1 tablet (10 mg total) by mouth daily., Disp: 90 tablet, Rfl: 0 Medication Side Effects: none  Family Medical/ Social History: Changes? No  MENTAL HEALTH EXAM:  There were no vitals taken for this visit.There is no height or weight on file to calculate BMI.  General Appearance: Casual and Well Groomed  Eye Contact:  Good  Speech:  Clear  and Coherent  Volume:  Normal  Mood:  Euthymic  Affect:  Appropriate  Thought Process:  Goal Directed  Orientation:  Full (Time, Place, and Person)  Thought Content: Logical   Suicidal Thoughts:  No  Homicidal Thoughts:  No  Memory:  WNL  Judgement:  Good  Insight:  Good  Psychomotor Activity:  Normal  Concentration:  Concentration: Good  Recall:  Good  Fund of Knowledge: Good  Language: Good  Assets:  Desire for Improvement  ADL's:  Intact  Cognition: WNL  Prognosis:  Good    DIAGNOSES:    ICD-10-CM   1. Generalized anxiety disorder F41.1   2. Major depressive disorder, recurrent episode, moderate (HCC) F33.1     Receiving Psychotherapy: No    RECOMMENDATIONS: Continue Trintellix 10mg .   Wellbutrin XL 300mg  qd. Continue Xanax 0.5mg  bid prn.   Return in 6 months or sooner as needed.   Donnal Moat, PA-C

## 2018-05-08 ENCOUNTER — Ambulatory Visit (INDEPENDENT_AMBULATORY_CARE_PROVIDER_SITE_OTHER): Payer: 59 | Admitting: Family Medicine

## 2018-05-08 ENCOUNTER — Encounter: Payer: Self-pay | Admitting: Family Medicine

## 2018-05-08 VITALS — BP 112/72 | HR 75 | Temp 97.5°F | Ht 65.0 in | Wt 194.2 lb

## 2018-05-08 DIAGNOSIS — K219 Gastro-esophageal reflux disease without esophagitis: Secondary | ICD-10-CM

## 2018-05-08 DIAGNOSIS — G4730 Sleep apnea, unspecified: Secondary | ICD-10-CM

## 2018-05-08 DIAGNOSIS — L719 Rosacea, unspecified: Secondary | ICD-10-CM | POA: Diagnosis not present

## 2018-05-08 DIAGNOSIS — Z7689 Persons encountering health services in other specified circumstances: Secondary | ICD-10-CM | POA: Diagnosis not present

## 2018-05-08 MED ORDER — AZELAIC ACID 20 % EX CREA: TOPICAL_CREAM | Freq: Two times a day (BID) | CUTANEOUS | 0 refills | Status: DC

## 2018-05-08 MED ORDER — DEXLANSOPRAZOLE 30 MG PO CPDR: 30.0000 mg | DELAYED_RELEASE_CAPSULE | Freq: Every day | ORAL | 3 refills | Status: DC

## 2018-05-08 MED ORDER — METRONIDAZOLE 0.75 % EX GEL: 1.0000 "application " | Freq: Two times a day (BID) | CUTANEOUS | 0 refills | Status: DC

## 2018-05-08 MED FILL — metroNIDAZOLE 0.75 % GEL: 0.75 | 30 days supply | Qty: 45 | Fill #0

## 2018-05-08 NOTE — Patient Instructions (Signed)

## 2018-05-08 NOTE — Progress Notes (Signed)
Subjective:    Patient ID: Aimee Hart, female    DOB: 12-Aug-1965, 53 y.o.   MRN: [id number]  Chief Complaint:  Establish Care (history of sleep apnea, was tested 11 years ago and used CPAP for 3 weeks, then stopped. would like to be tested again)   HPI: Aimee Hart is a 53 y.o. female presenting on 05/08/2018 for Establish Care (history of sleep apnea, was tested 11 years ago and used CPAP for 3 weeks, then stopped. would like to be tested again)   1. Encounter to establish care  Pt presents today to establish care. Pt states she has not seen a PCP in years. States she does see her GYN on a regular basis. States her mammogram is due this year. She had a PAP in Feb 2019 and a colonoscopy in 2016 with Dr. Collene Mares. We will request records and update EMR when records are received.  Pt states she is doing ok overall. States she was taking Dexilant for her GERD. States the medication cost went up and she switched to over the counter Prilosec. She states once she did this she started having break thorough symptoms. States she has reflux at least 2 per week and it is exacerbated by some foods. She denies cough, hemoptysis, or sore throat.  She states about 10 years ago she was diagnosed with sleep apnea. States she went for a sleep study and was prescribed a CPAP. She reports she was unable to tolerate the CPAP so she returned it. Pt states her symptoms have become worse over the last few months and she would like a referral for a sleep study.  Results of the Epworth flowsheet 05/08/2018  Sitting and reading 1  Watching TV 1  Sitting, inactive in a public place (e.g. a theatre or a meeting) 0  As a passenger in a car for an hour without a break 2  Lying down to rest in the afternoon when circumstances permit 2  Sitting and talking to someone 0  Sitting quietly after a lunch without alcohol 0  In a car, while stopped for a few minutes in traffic 0  Total score 6  Pt states she has rosacea  and is out of her topical medications. States she has noticed an increase in facial flushing, dryness, and redness since stopping the medication.      Relevant past medical, surgical, family, and social history reviewed and updated as indicated.  Allergies and medications reviewed and updated.   Past Medical History:  Diagnosis Date  . Depression   . GERD (gastroesophageal reflux disease)   . Sleep apnea     Past Surgical History:  Procedure Laterality Date  . LAPAROSCOPIC ROUX-EN-Y GASTRIC BYPASS WITH UPPER ENDOSCOPY AND REMOVAL OF LAP BAND  2008  . TONSILLECTOMY  1980    Social History   Socioeconomic History  . Marital status: Divorced    Spouse name: Not on file  . Number of children: Not on file  . Years of education: Not on file  . Highest education level: Not on file  Occupational History  . Not on file  Social Needs  . Financial resource strain: Not on file  . Food insecurity:    Worry: Not on file    Inability: Not on file  . Transportation needs:    Medical: Not on file    Non-medical: Not on file  Tobacco Use  . Smoking status: Former Smoker    Packs/day: 1.00  Years: 10.00    Pack years: 10.00    Types: Cigarettes    Last attempt to quit: 02/07/1996    Years since quitting: 22.2  . Smokeless tobacco: Never Used  Substance and Sexual Activity  . Alcohol use: Not Currently  . Drug use: Never  . Sexual activity: Not on file  Lifestyle  . Physical activity:    Days per week: Not on file    Minutes per session: Not on file  . Stress: Not on file  Relationships  . Social connections:    Talks on phone: Not on file    Gets together: Not on file    Attends religious service: Not on file    Active member of club or organization: Not on file    Attends meetings of clubs or organizations: Not on file    Relationship status: Not on file  . Intimate partner violence:    Fear of current or ex partner: Not on file    Emotionally abused: Not on file      Physically abused: Not on file    Forced sexual activity: Not on file  Other Topics Concern  . Not on file  Social History Narrative  . Not on file    Outpatient Encounter Medications as of 05/08/2018  Medication Sig  . ALPRAZolam (XANAX) 0.5 MG tablet Take 0.5 mg by mouth 2 (two) times daily as needed for anxiety.  Marland Kitchen buPROPion (WELLBUTRIN XL) 300 MG 24 hr tablet Take 300 mg by mouth every morning.   . Calcium Carbonate-Vit D-Min (CALCIUM 1200 PO) Take 1,200 mg by mouth daily.  Marland Kitchen ibuprofen (ADVIL,MOTRIN) 200 MG tablet Take 600 mg by mouth every 6 (six) hours as needed for headache, mild pain or moderate pain.  Marland Kitchen omeprazole (PRILOSEC OTC) 20 MG tablet Take 20 mg by mouth daily.  . TRINTELLIX 10 MG TABS tablet Take 1 tablet (10 mg total) by mouth daily.  Marland Kitchen Dexlansoprazole 30 MG capsule Take 1 capsule (30 mg total) by mouth daily.  . metroNIDAZOLE (METROGEL) 0.75 % gel Apply 1 application topically 2 (two) times daily.  . [DISCONTINUED] azelaic acid (AZELEX) 20 % cream Apply topically 2 (two) times daily. After skin is thoroughly washed and patted dry, gently but thoroughly massage a thin film of azelaic acid cream into the affected area twice daily, in the morning and evening.   No facility-administered encounter medications on file as of 05/08/2018.     No Known Allergies  Review of Systems  Constitutional: Positive for appetite change. Negative for chills, fatigue and fever.  HENT: Negative for trouble swallowing and voice change.   Respiratory: Negative for cough, choking, chest tightness and shortness of breath.   Cardiovascular: Negative for chest pain and palpitations.  Gastrointestinal: Negative for abdominal distention, abdominal pain, anal bleeding and blood in stool.       Reflux  Skin: Positive for rash (central face).  Neurological: Negative for dizziness, weakness, light-headedness and headaches.  Psychiatric/Behavioral: Positive for sleep disturbance. Negative for  confusion.  All other systems reviewed and are negative.       Objective:    BP 112/72   Pulse 75   Temp (!) 97.5 F (36.4 C) (Oral)   Ht 5\' 5"  (1.651 m)   Wt 194 lb 4 oz (88.1 kg)   LMP 04/23/2018 (Exact Date)   BMI 32.32 kg/m    Wt Readings from Last 3 Encounters:  05/08/18 194 lb 4 oz (88.1 kg)  01/14/18 188 lb (  85.3 kg)    Physical Exam Vitals signs and nursing note reviewed.  Constitutional:      General: She is not in acute distress.    Appearance: Normal appearance.  HENT:     Head: Normocephalic and atraumatic.     Mouth/Throat:     Mouth: Mucous membranes are moist.  Eyes:     Conjunctiva/sclera: Conjunctivae normal.     Pupils: Pupils are equal, round, and reactive to light.  Cardiovascular:     Rate and Rhythm: Normal rate.  Pulmonary:     Effort: Pulmonary effort is normal. No respiratory distress.  Skin:    General: Skin is warm and dry.     Capillary Refill: Capillary refill takes less than 2 seconds.     Findings: Rash (centrofacial redness with papules and dryness) present.  Neurological:     General: No focal deficit present.     Mental Status: She is alert. Mental status is at baseline. She is disoriented.  Psychiatric:        Mood and Affect: Mood normal.        Behavior: Behavior normal.        Thought Content: Thought content normal.        Judgment: Judgment normal.     Results for orders placed or performed during the hospital encounter of 01/14/18  CBC  Result Value Ref Range   WBC 11.0 3.6 - 11.0 K/uL   RBC 5.03 3.80 - 5.20 MIL/uL   Hemoglobin 13.9 12.0 - 16.0 g/dL   HCT 40.9 35.0 - 47.0 %   MCV 81.3 80.0 - 100.0 fL   MCH 27.5 26.0 - 34.0 pg   MCHC 33.9 32.0 - 36.0 g/dL   RDW 14.3 11.5 - 14.5 %   Platelets 298 150 - 440 K/uL  Comprehensive metabolic panel  Result Value Ref Range   Sodium 137 135 - 145 mmol/L   Potassium 3.9 3.5 - 5.1 mmol/L   Chloride 106 98 - 111 mmol/L   CO2 24 22 - 32 mmol/L   Glucose, Bld 99 70 - 99  mg/dL   BUN 19 6 - 20 mg/dL   Creatinine, Ser 0.91 0.44 - 1.00 mg/dL   Calcium 8.6 (L) 8.9 - 10.3 mg/dL   Total Protein 7.5 6.5 - 8.1 g/dL   Albumin 4.3 3.5 - 5.0 g/dL   AST 17 15 - 41 U/L   ALT 10 0 - 44 U/L   Alkaline Phosphatase 52 38 - 126 U/L   Total Bilirubin 0.6 0.3 - 1.2 mg/dL   GFR calc non Af Amer >60 >60 mL/min   GFR calc Af Amer >60 >60 mL/min   Anion gap 7 5 - 15  Troponin I  Result Value Ref Range   Troponin I <0.03 <0.03 ng/mL  Influenza panel by PCR (type A & B)  Result Value Ref Range   Influenza A By PCR NEGATIVE NEGATIVE   Influenza B By PCR NEGATIVE NEGATIVE  Fibrin derivatives D-Dimer ([facility] only)  Result Value Ref Range   Fibrin derivatives D-dimer ([facility]) 358.01 0.00 - 499.00 ng/mL (FEU)       Pertinent labs & imaging results that were available during my care of the patient were reviewed by me and considered in my medical decision making.  Assessment & Plan:  Emorie was seen today for establish care.  Diagnoses and all orders for this visit:  Encounter to establish care  Sleep apnea in adult -     Ambulatory  referral to Neurology  GERD without esophagitis Report any new or worsening symptoms. Avoid triggers such as caffeine and spicy foods. Medications as prescribed.  -     Dexlansoprazole 30 MG capsule; Take 1 capsule (30 mg total) by mouth daily.  Rosacea -     metroNIDAZOLE (METROGEL) 0.75 % gel; Apply 1 application topically 2 (two) times daily.     Continue all other maintenance medications.  Follow up plan: Return if symptoms worsen or fail to improve, for CPE without PAP.  Educational handout given for GERD  The above assessment and management plan was discussed with the patient. The patient verbalized understanding of and has agreed to the management plan. Patient is aware to call the clinic if symptoms persist or worsen. Patient is aware when to return to the clinic for a follow-up visit. Patient educated on when it is appropriate  to go to the emergency department.   Monia Pouch, FNP-C [facility] [phone]

## 2018-05-14 ENCOUNTER — Ambulatory Visit (INDEPENDENT_AMBULATORY_CARE_PROVIDER_SITE_OTHER): Payer: 59 | Admitting: Family Medicine

## 2018-05-14 ENCOUNTER — Encounter: Payer: Self-pay | Admitting: Family Medicine

## 2018-05-14 VITALS — BP 120/75 | HR 80 | Temp 97.4°F | Ht 65.0 in | Wt 194.0 lb

## 2018-05-14 DIAGNOSIS — Z0001 Encounter for general adult medical examination with abnormal findings: Secondary | ICD-10-CM | POA: Diagnosis not present

## 2018-05-14 DIAGNOSIS — K219 Gastro-esophageal reflux disease without esophagitis: Secondary | ICD-10-CM | POA: Diagnosis not present

## 2018-05-14 DIAGNOSIS — Z0000 Encounter for general adult medical examination without abnormal findings: Secondary | ICD-10-CM | POA: Diagnosis not present

## 2018-05-14 NOTE — Patient Instructions (Signed)

## 2018-05-14 NOTE — Progress Notes (Signed)
Subjective:    Patient ID: Aimee Hart, female    DOB: 1965-08-14, 53 y.o.   MRN: [id number]  Chief Complaint:  Annual Exam (sees gyn for paps, last one March 2018, has upcoming appointment)   HPI: Aimee Hart is a 53 y.o. female presenting on 05/14/2018 for Annual Exam (sees gyn for paps, last one March 2018, has upcoming appointment)  Pt presents today for her annual physical exam. She is doing well overall. She states she has not had her sleep study completed, states she is waiting on an appointment. She has a negative colonoscopy at age 60. States she is due for her PAP and mammogram - has this at her GYN office. She states she started the [facility] and has had great results. States no breakthrough symptoms after 2 days of taking the medication. She denies other complaints or concerns.   Relevant past medical, surgical, family, and social history reviewed and updated as indicated.  Allergies and medications reviewed and updated.   Past Medical History:  Diagnosis Date  . Depression   . GERD (gastroesophageal reflux disease)   . Sleep apnea     Past Surgical History:  Procedure Laterality Date  . LAPAROSCOPIC ROUX-EN-Y GASTRIC BYPASS WITH UPPER ENDOSCOPY AND REMOVAL OF LAP BAND  2008  . TONSILLECTOMY  1980    Social History   Socioeconomic History  . Marital status: Divorced    Spouse name: Not on file  . Number of children: Not on file  . Years of education: Not on file  . Highest education level: Not on file  Occupational History  . Not on file  Social Needs  . Financial resource strain: Not on file  . Food insecurity:    Worry: Not on file    Inability: Not on file  . Transportation needs:    Medical: Not on file    Non-medical: Not on file  Tobacco Use  . Smoking status: Former Smoker    Packs/day: 1.00    Years: 10.00    Pack years: 10.00    Types: Cigarettes    Last attempt to quit: 02/07/1996    Years since quitting: 22.2  . Smokeless  tobacco: Never Used  Substance and Sexual Activity  . Alcohol use: Not Currently  . Drug use: Never  . Sexual activity: Yes  Lifestyle  . Physical activity:    Days per week: Not on file    Minutes per session: Not on file  . Stress: Not on file  Relationships  . Social connections:    Talks on phone: Not on file    Gets together: Not on file    Attends religious service: Not on file    Active member of club or organization: Not on file    Attends meetings of clubs or organizations: Not on file    Relationship status: Not on file  . Intimate partner violence:    Fear of current or ex partner: Not on file    Emotionally abused: Not on file    Physically abused: Not on file    Forced sexual activity: Not on file  Other Topics Concern  . Not on file  Social History Narrative  . Not on file    Outpatient Encounter Medications as of 05/14/2018  Medication Sig  . ALPRAZolam (XANAX) 0.5 MG tablet Take 0.5 mg by mouth 2 (two) times daily as needed for anxiety.  Marland Kitchen buPROPion (WELLBUTRIN XL) 300 MG 24 hr tablet Take  300 mg by mouth every morning.   . Calcium Carbonate-Vit D-Min (CALCIUM 1200 PO) Take 1,200 mg by mouth daily.  Marland Kitchen Dexlansoprazole 30 MG capsule Take 1 capsule (30 mg total) by mouth daily.  Marland Kitchen ibuprofen (ADVIL,MOTRIN) 200 MG tablet Take 600 mg by mouth every 6 (six) hours as needed for headache, mild pain or moderate pain.  . metroNIDAZOLE (METROGEL) 0.75 % gel Apply 1 application topically 2 (two) times daily.  . TRINTELLIX 10 MG TABS tablet Take 1 tablet (10 mg total) by mouth daily.  . [DISCONTINUED] omeprazole (PRILOSEC OTC) 20 MG tablet Take 20 mg by mouth daily.   No facility-administered encounter medications on file as of 05/14/2018.     No Known Allergies  Review of Systems  Constitutional: Negative for chills, fatigue and fever.  Eyes: Negative for photophobia and visual disturbance.  Respiratory: Negative for chest tightness and shortness of breath.         Sleep apnea symptoms  Cardiovascular: Positive for leg swelling (after working 12 hour shifts, minimal ). Negative for chest pain and palpitations.  Gastrointestinal: Negative for abdominal pain, constipation, diarrhea, nausea and vomiting.  Genitourinary: Positive for menstrual problem (LMP lasted 5 days and then she had 5 days of spotting).  Musculoskeletal: Negative for arthralgias and joint swelling.  Skin: Negative for color change, pallor, rash and wound.  Neurological: Negative for headaches.  Psychiatric/Behavioral: Negative for decreased concentration, dysphoric mood and sleep disturbance. The patient is not nervous/anxious.   All other systems reviewed and are negative.       Objective:    BP 120/75   Pulse 80   Temp (!) 97.4 F (36.3 C) (Oral)   Ht '5\' 5"'  (1.651 m)   Wt 194 lb (88 kg)   LMP 04/23/2018 (Exact Date)   BMI 32.28 kg/m    Wt Readings from Last 3 Encounters:  05/14/18 194 lb (88 kg)  05/08/18 194 lb 4 oz (88.1 kg)  01/14/18 188 lb (85.3 kg)    Physical Exam Vitals signs and nursing note reviewed.  Constitutional:      General: She is not in acute distress.    Appearance: Normal appearance. She is well-developed and well-groomed. She is obese. She is not ill-appearing or toxic-appearing.  HENT:     Head: Normocephalic and atraumatic.     Jaw: There is normal jaw occlusion.     Right Ear: Hearing, tympanic membrane, ear canal and external ear normal.     Left Ear: Hearing, tympanic membrane, ear canal and external ear normal.     Nose: Nose normal.     Mouth/Throat:     Lips: Pink.     Mouth: Mucous membranes are moist.     Pharynx: Oropharynx is clear. Uvula midline.  Eyes:     General: Lids are normal.     Extraocular Movements: Extraocular movements intact.     Conjunctiva/sclera: Conjunctivae normal.     Pupils: Pupils are equal, round, and reactive to light.  Neck:     Musculoskeletal: Full passive range of motion without pain and neck  supple.     Thyroid: No thyroid mass, thyromegaly or thyroid tenderness.     Vascular: No carotid bruit or JVD.     Trachea: Trachea and phonation normal.  Cardiovascular:     Rate and Rhythm: Normal rate and regular rhythm.     Pulses: Normal pulses.     Heart sounds: Normal heart sounds. No murmur. No friction rub. No gallop.  Pulmonary:     Effort: Pulmonary effort is normal.     Breath sounds: Normal breath sounds and air entry.  Abdominal:     General: Bowel sounds are normal.     Palpations: Abdomen is soft.     Tenderness: There is no abdominal tenderness.  Musculoskeletal: Normal range of motion.  Lymphadenopathy:     Cervical: No cervical adenopathy.  Skin:    General: Skin is warm and dry.     Capillary Refill: Capillary refill takes less than 2 seconds.  Neurological:     General: No focal deficit present.     Mental Status: She is alert and oriented to person, place, and time.     Cranial Nerves: Cranial nerves are intact.     Sensory: Sensation is intact.     Motor: Motor function is intact.     Gait: Gait is intact.     Deep Tendon Reflexes: Reflexes are normal and symmetric.  Psychiatric:        Attention and Perception: Attention and perception normal.        Mood and Affect: Mood and affect normal.        Speech: Speech normal.        Behavior: Behavior normal. Behavior is cooperative.        Thought Content: Thought content normal.        Cognition and Memory: Cognition and memory normal.        Judgment: Judgment normal.     Results for orders placed or performed during the hospital encounter of 01/14/18  CBC  Result Value Ref Range   WBC 11.0 3.6 - 11.0 K/uL   RBC 5.03 3.80 - 5.20 MIL/uL   Hemoglobin 13.9 12.0 - 16.0 g/dL   HCT 40.9 35.0 - 47.0 %   MCV 81.3 80.0 - 100.0 fL   MCH 27.5 26.0 - 34.0 pg   MCHC 33.9 32.0 - 36.0 g/dL   RDW 14.3 11.5 - 14.5 %   Platelets 298 150 - 440 K/uL  Comprehensive metabolic panel  Result Value Ref Range    Sodium 137 135 - 145 mmol/L   Potassium 3.9 3.5 - 5.1 mmol/L   Chloride 106 98 - 111 mmol/L   CO2 24 22 - 32 mmol/L   Glucose, Bld 99 70 - 99 mg/dL   BUN 19 6 - 20 mg/dL   Creatinine, Ser 0.91 0.44 - 1.00 mg/dL   Calcium 8.6 (L) 8.9 - 10.3 mg/dL   Total Protein 7.5 6.5 - 8.1 g/dL   Albumin 4.3 3.5 - 5.0 g/dL   AST 17 15 - 41 U/L   ALT 10 0 - 44 U/L   Alkaline Phosphatase 52 38 - 126 U/L   Total Bilirubin 0.6 0.3 - 1.2 mg/dL   GFR calc non Af Amer >60 >60 mL/min   GFR calc Af Amer >60 >60 mL/min   Anion gap 7 5 - 15  Troponin I  Result Value Ref Range   Troponin I <0.03 <0.03 ng/mL  Influenza panel by PCR (type A & B)  Result Value Ref Range   Influenza A By PCR NEGATIVE NEGATIVE   Influenza B By PCR NEGATIVE NEGATIVE  Fibrin derivatives D-Dimer ([facility] only)  Result Value Ref Range   Fibrin derivatives D-dimer ([facility]) 358.01 0.00 - 499.00 ng/mL (FEU)       Pertinent labs & imaging results that were available during my care of the patient were reviewed by me and considered in my  medical decision making.  Assessment & Plan:  Aimee Hart was seen today for annual exam.  Diagnoses and all orders for this visit:  Annual physical exam Has mammogram and PAP scheduled with GYN in March - will request records. Colonoscopy completed at age 46 - will request records. Labs pending. Health maintenance discussed.  -     CBC with Differential/Platelet -     CMP14+EGFR -     Cancel: Microalbumin / creatinine urine ratio -     TSH -     Lipid panel -     HIV Antibody (routine testing w rflx)  GERD without esophagitis Has failed omeprazole. Trial of Dexliant provided at last visit, states much improvement after 2 days of therapy. Dexilant prescribed. Samples provided today. Report any new or worsening symptoms.     Continue all other maintenance medications.  Follow up plan: Return in about 3 months (around 08/13/2018), or if symptoms worsen or fail to improve.  Educational handout given  for health maintenance   The above assessment and management plan was discussed with the patient. The patient verbalized understanding of and has agreed to the management plan. Patient is aware to call the clinic if symptoms persist or worsen. Patient is aware when to return to the clinic for a follow-up visit. Patient educated on when it is appropriate to go to the emergency department.   Monia Pouch, FNP-C [facility] [phone]

## 2018-05-15 LAB — CBC WITH DIFFERENTIAL/PLATELET
Basophils Absolute: 0.1 10*3/uL (ref 0.0–0.2)
Basos: 2 %
EOS (ABSOLUTE): 0.2 10*3/uL (ref 0.0–0.4)
Eos: 3 %
Hematocrit: 37.7 % (ref 34.0–46.6)
Hemoglobin: 12.9 g/dL (ref 11.1–15.9)
Immature Grans (Abs): 0 10*3/uL (ref 0.0–0.1)
Immature Granulocytes: 0 %
LYMPHS ABS: 1.2 10*3/uL (ref 0.7–3.1)
Lymphs: 23 %
MCH: 28.2 pg (ref 26.6–33.0)
MCHC: 34.2 g/dL (ref 31.5–35.7)
MCV: 83 fL (ref 79–97)
Monocytes Absolute: 0.4 10*3/uL (ref 0.1–0.9)
Monocytes: 8 %
Neutrophils Absolute: 3.6 10*3/uL (ref 1.4–7.0)
Neutrophils: 64 %
Platelets: 344 10*3/uL (ref 150–450)
RBC: 4.57 x10E6/uL (ref 3.77–5.28)
RDW: 13.5 % (ref 11.7–15.4)
WBC: 5.5 10*3/uL (ref 3.4–10.8)

## 2018-05-15 LAB — LIPID PANEL
Chol/HDL Ratio: 3.8 ratio (ref 0.0–4.4)
Cholesterol, Total: 221 mg/dL — ABNORMAL HIGH (ref 100–199)
HDL: 58 mg/dL (ref >39)
LDL Calculated: 144 mg/dL — ABNORMAL HIGH (ref 0–99)
Triglycerides: 94 mg/dL (ref 0–149)
VLDL Cholesterol Cal: 19 mg/dL (ref 5–40)

## 2018-05-15 LAB — CMP14+EGFR
ALBUMIN: 4.4 g/dL (ref 3.8–4.9)
ALK PHOS: 56 IU/L (ref 39–117)
ALT: 9 IU/L (ref 0–32)
AST: 11 IU/L (ref 0–40)
Albumin/Globulin Ratio: 2 (ref 1.2–2.2)
BUN / CREAT RATIO: 12 (ref 9–23)
BUN: 15 mg/dL (ref 6–24)
Bilirubin Total: 0.3 mg/dL (ref 0.0–1.2)
CO2: 24 mmol/L (ref 20–29)
Calcium: 9.2 mg/dL (ref 8.7–10.2)
Chloride: 106 mmol/L (ref 96–106)
Creatinine, Ser: 1.25 mg/dL — ABNORMAL HIGH (ref 0.57–1.00)
GFR calc Af Amer: 57 mL/min/{1.73_m2} — ABNORMAL LOW (ref >59)
GFR calc non Af Amer: 49 mL/min/{1.73_m2} — ABNORMAL LOW (ref >59)
Globulin, Total: 2.2 g/dL (ref 1.5–4.5)
Glucose: 92 mg/dL (ref 65–99)
Potassium: 4.7 mmol/L (ref 3.5–5.2)
SODIUM: 143 mmol/L (ref 134–144)
Total Protein: 6.6 g/dL (ref 6.0–8.5)

## 2018-05-15 LAB — HIV ANTIBODY (ROUTINE TESTING W REFLEX): HIV SCREEN 4TH GENERATION: NONREACTIVE

## 2018-05-15 LAB — TSH: TSH: 1.45 u[IU]/mL (ref 0.450–4.500)

## 2018-06-10 ENCOUNTER — Other Ambulatory Visit: Payer: Self-pay | Admitting: Physician Assistant

## 2018-06-10 DIAGNOSIS — M25511 Pain in right shoulder: Secondary | ICD-10-CM | POA: Diagnosis not present

## 2018-06-10 DIAGNOSIS — M7541 Impingement syndrome of right shoulder: Secondary | ICD-10-CM | POA: Diagnosis not present

## 2018-06-11 MED FILL — TRINTELLIX 10 MG TABLET: 10 | 90 days supply | Qty: 90 | Fill #0

## 2018-06-26 ENCOUNTER — Encounter: Payer: Self-pay | Admitting: Neurology

## 2018-06-26 ENCOUNTER — Ambulatory Visit (INDEPENDENT_AMBULATORY_CARE_PROVIDER_SITE_OTHER): Payer: 59 | Admitting: Neurology

## 2018-06-26 VITALS — BP 116/80 | HR 77 | Ht 65.5 in | Wt 196.0 lb

## 2018-06-26 DIAGNOSIS — G4719 Other hypersomnia: Secondary | ICD-10-CM | POA: Diagnosis not present

## 2018-06-26 DIAGNOSIS — E669 Obesity, unspecified: Secondary | ICD-10-CM

## 2018-06-26 DIAGNOSIS — G4733 Obstructive sleep apnea (adult) (pediatric): Secondary | ICD-10-CM | POA: Diagnosis not present

## 2018-06-26 DIAGNOSIS — R51 Headache: Secondary | ICD-10-CM

## 2018-06-26 DIAGNOSIS — R351 Nocturia: Secondary | ICD-10-CM

## 2018-06-26 DIAGNOSIS — Z820 Family history of epilepsy and other diseases of the nervous system: Secondary | ICD-10-CM | POA: Diagnosis not present

## 2018-06-26 DIAGNOSIS — Z9884 Bariatric surgery status: Secondary | ICD-10-CM

## 2018-06-26 DIAGNOSIS — R519 Headache, unspecified: Secondary | ICD-10-CM

## 2018-06-26 NOTE — Patient Instructions (Signed)

## 2018-06-26 NOTE — Progress Notes (Signed)
Subjective:    Patient ID: Aimee Hart is a 53 y.o. female.  HPI     Star Age, MD, PhD [facility] [address] [place], [place] [address]  Dear Vaughan Basta,   I saw your patient, Aimee Hart, upon your kind request in my sleep clinic today for initial consultation of her sleep disorder, in particular, concern for underlying obstructive sleep apnea. The patient is unaccompanied today. As you know, Aimee Hart is a 53 year old right-handed woman with an underlying medical history of reflux disease, rosacea, and obesity, who reports snoring and excessive daytime somnolence. I reviewed your office note from 05/08/2018. Patient had a sleep study several years ago, maybe as long as 10 years ago. Prior sleep study results are not available for my review today. She reports that she was diagnosed with severe obstructive sleep apnea. She tried CPAP therapy in the beginning, maybe for total of 3 weeks but could not tolerate it at the time in particular because of discomfort with the mask.she would be willing to reconsider CPAP therapy if the need arises. She is agreeable to sleep study testing. She reports nonrestorative sleep, Epworth sleepiness score is 8 out of 24, fatigue score is 29 out of 63. When she has a chance such as during the week when she is not working or on weekends she will sleep up to 10 hours but does not wake up rested. Of note, in 2008 she had weight loss surgery in the form of lap band and gained initially about 65 pounds but gained some weight back in the realm of 20 pounds. She was also able to lose weight when she recently rejoined weight watchers. She has occasional morning headaches, she has nocturia about once or twice per average night. She suspects that her father has sleep apnea, he is 72 years old with has not been formally tested. Her bedtime is around 10 advised him around 5:30. 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  She lives with her 73 year old son. Her 75 year old daughter is in college in [place]. The patient is divorced and has a boyfriend who is very supportive. She quit smoking in 1997. She drinks alcohol in limitation, maybe 1 drink per week on average, caffeine in the form of soda, 16 ounce per day on average. They have 2 dogs and 1 cat in the household, not in her bedroom area. She had a tonsillectomy and adenoidectomy. She snored as a child. Even when she was at her lowest weight she still snored.  Her Past Medical History Is Significant For: Past Medical History:  Diagnosis Date  . Depression   . GERD (gastroesophageal reflux disease)   . Hypercholesteremia   . Sleep apnea     Her Past Surgical History Is Significant For: Past Surgical History:  Procedure Laterality Date  . LAPAROSCOPIC ROUX-EN-Y GASTRIC BYPASS WITH UPPER ENDOSCOPY AND REMOVAL OF LAP BAND  2008  . TONSILLECTOMY  1980    Her Family History Is Significant For: Family History  Problem Relation Age of Onset  . Melanoma Mother   . Hypertension Mother   . Depression Mother   . Thyroid disease Mother   . Hypertension Father   . Heart disease Father   . Thyroid disease Father   . Cancer Sister        bladder  . Post-traumatic stress disorder Brother   . Kidney disease Daughter   . Cancer Maternal Grandmother  lung  . Emphysema Maternal Grandfather   . Alzheimer's disease Paternal Grandmother   . Heart disease Paternal Grandfather     Her Social History Is Significant For: Social History   Socioeconomic History  . Marital status: Divorced    Spouse name: Not on file  . Number of children: 2  . Years of education: college  . Highest education level: Not on file  Occupational History    Comment: RN  Social Needs  . Financial resource strain: Not on file  . Food insecurity:    Worry: Not on file    Inability: Not on file  . Transportation needs:    Medical: Not on file    Non-medical: Not on file   Tobacco Use  . Smoking status: Former Smoker    Packs/day: 1.00    Years: 10.00    Pack years: 10.00    Types: Cigarettes    Last attempt to quit: 02/07/1996    Years since quitting: 22.3  . Smokeless tobacco: Never Used  Substance and Sexual Activity  . Alcohol use: Yes    Comment: 1/week  . Drug use: Never  . Sexual activity: Yes  Lifestyle  . Physical activity:    Days per week: Not on file    Minutes per session: Not on file  . Stress: Not on file  Relationships  . Social connections:    Talks on phone: Not on file    Gets together: Not on file    Attends religious service: Not on file    Active member of club or organization: Not on file    Attends meetings of clubs or organizations: Not on file    Relationship status: Not on file  Other Topics Concern  . Not on file  Social History Narrative   Lives with son   Caffeine 16 oz daily    Her Allergies Are:  No Known Allergies:   Her Current Medications Are:  Outpatient Encounter Medications as of 06/26/2018  Medication Sig  . ALPRAZolam (XANAX) 0.5 MG tablet Take 0.5 mg by mouth 2 (two) times daily as needed for anxiety.  Marland Kitchen buPROPion (WELLBUTRIN XL) 300 MG 24 hr tablet Take 300 mg by mouth every morning.   . Calcium Carbonate-Vit D-Min (CALCIUM 1200 PO) Take 1,200 mg by mouth daily.  Marland Kitchen ibuprofen (ADVIL,MOTRIN) 200 MG tablet Take 600 mg by mouth every 6 (six) hours as needed for headache, mild pain or moderate pain.  . metroNIDAZOLE (METROGEL) 0.75 % gel Apply 1 application topically 2 (two) times daily.  Marland Kitchen omeprazole (PRILOSEC) 10 MG capsule Take 10 mg by mouth daily.  . TRINTELLIX 10 MG TABS tablet TAKE 1 TABLET BY MOUTH ONCE DAILY  . [DISCONTINUED] Dexlansoprazole 30 MG capsule Take 1 capsule (30 mg total) by mouth daily.   No facility-administered encounter medications on file as of 06/26/2018.   :  Review of Systems:  Out of a complete 14 point review of systems, all are reviewed and negative with the  exception of these symptoms as listed below:  Review of Systems  Neurological:       Rm 1, New Pt, alone, previously dx with sleep apnea 2007  Epworth Sleepiness Scale 0= would never doze 1= slight chance of dozing 2= moderate chance of dozing 3= high chance of dozing  Sitting and reading:1 Watching TV: 1 Sitting inactive in a public place (ex. Theater or meeting): 0 As a passenger in a car for an hour without a break:2 Lying  down to rest in the afternoon:3 Sitting and talking to someone:0 Sitting quietly after lunch (no alcohol):1 In a car, while stopped in traffic:0 Total:8    Objective:  Neurological Exam  Physical Exam Physical Examination:   Vitals:   06/26/18 1549  BP: 116/80  Pulse: 77   General Examination: The patient is a very pleasant 53 y.o. female in no acute distress. She appears well-developed and well-nourished and well groomed.   HEENT: Normocephalic, atraumatic, pupils are equal, round and reactive to light and accommodation. Funduscopic exam is normal with sharp disc margins noted. Extraocular tracking is good without limitation to gaze excursion or nystagmus noted. Normal smooth pursuit is noted. Hearing is grossly intact. Tympanic membranes are clear bilaterally. Face is symmetric with normal facial animation and normal facial sensation. Speech is clear with no dysarthria noted. There is no hypophonia. There is no lip, neck/head, jaw or voice tremor. Neck is supple with full range of passive and active motion. There are no carotid bruits on auscultation. Oropharynx exam reveals: mild mouth dryness, good dental hygiene with invisalign retainers in place and moderate airway crowding, due to smaller airway entry and larger uvula. Tonsils are absent. Mallampati is class I. Neck circumference is 15 inches. She has no significant overbite. No carotid bruits.   Chest: Clear to auscultation without wheezing, rhonchi or crackles noted.  Heart: S1+S2+0, regular and  normal without murmurs, rubs or gallops noted.   Abdomen: Soft, non-tender and non-distended with normal bowel sounds appreciated on auscultation.  Extremities: There is no pitting edema in the distal lower extremities bilaterally. Pedal pulses are intact.  Skin: Warm and dry without trophic changes noted.  Musculoskeletal: exam reveals no obvious joint deformities, tenderness or joint swelling or erythema.   Neurologically:  Mental status: The patient is awake, alert and oriented in all 4 spheres. Her immediate and remote memory, attention, language skills and fund of knowledge are appropriate. There is no evidence of aphasia, agnosia, apraxia or anomia. Speech is clear with normal prosody and enunciation. Thought process is linear. Mood is normal and affect is normal.  Cranial nerves II - XII are as described above under HEENT exam. In addition: shoulder shrug is normal with equal shoulder height noted. Motor exam: Normal bulk, strength and tone is noted. There is no drift, tremor or rebound. Romberg is negative. Fine motor skills and coordination: intact with normal finger taps, normal hand movements, normal rapid alternating patting, normal foot taps and normal foot agility.  Cerebellar testing: No dysmetria or intention tremor. There is no truncal or gait ataxia.  Sensory exam: intact to light touch in the upper and lower extremities.  Gait, station and balance: She stands easily. No veering to one side is noted. No leaning to one side is noted. Posture is age-appropriate and stance is narrow based. Gait shows decreased stride length and normal pace. No problems turning are noted.   Assessment and Plan:  In summary, RYANN PAULI is a very pleasant 53 y.o.-year old female with an underlying medical history of reflux disease, rosacea, and obesity, who presents for reevaluation of her prior diagnosis of obstructive sleep apnea. She reports a prior diagnosis of severe obstructive sleep apnea  with sleep study testing in or around 2007. She had weight loss surgery in 2008 and lost a significant amount of weight but also gained some weight back. Her history and physical examination are concerning for underlying obstructive sleep apnea and she would benefit from reevaluation and consideration of treatment.  I had a long chat with the patient about my findings and the diagnosis of OSA, its prognosis and treatment options. We talked about medical treatments, surgical interventions and non-pharmacological approaches. I explained in particular the risks and ramifications of untreated moderate to severe OSA, especially with respect to developing cardiovascular disease down the Road, including congestive heart failure, difficult to treat hypertension, cardiac arrhythmias, or stroke. Even type 2 diabetes has, in part, been linked to untreated OSA. Symptoms of untreated OSA include daytime sleepiness, memory problems, mood irritability and mood disorder such as depression and anxiety, lack of energy, as well as recurrent headaches, especially morning headaches. We talked about trying to maintain a healthy lifestyle in general, as well as the importance of weight control. I encouraged the patient to eat healthy, exercise daily and keep well hydrated, to keep a scheduled bedtime and wake time routine, to not skip any meals and eat healthy snacks in between meals. I advised the patient not to drive when feeling sleepy. I recommended the following at this time: sleep study with potential positive airway pressure titration. (We will score hypopneas at 3%).   I explained the sleep test procedure to the patient and also outlined possible surgical and non-surgical treatment options of OSA, including the use of a custom-made dental device (which would require a referral to a specialist dentist or oral surgeon), upper airway surgical options, such as pillar implants, radiofrequency surgery, tongue base surgery, and UPPP  (which would involve a referral to an ENT surgeon). Rarely, jaw surgery such as mandibular advancement may be considered.  I also explained the CPAP treatment option to the patient, who indicated that she would be willing to try CPAP if the need arises. I explained the importance of being compliant with PAP treatment, not only for insurance purposes but primarily to improve Her symptoms, and for the patient's long term health benefit, including to reduce Her cardiovascular risks. I answered all her questions today and the patient was in agreement. I plan to see her back after the sleep study is completed and encouraged her to call with any interim questions, concerns, problems or updates.   Thank you very much for allowing me to participate in the care of this nice patient. If I can be of any further assistance to you please do not hesitate to call me at [phone].  Sincerely,   Star Age, MD, PhD

## 2018-07-04 ENCOUNTER — Ambulatory Visit (INDEPENDENT_AMBULATORY_CARE_PROVIDER_SITE_OTHER): Payer: 59 | Admitting: Neurology

## 2018-07-04 DIAGNOSIS — R51 Headache: Secondary | ICD-10-CM

## 2018-07-04 DIAGNOSIS — Z9884 Bariatric surgery status: Secondary | ICD-10-CM

## 2018-07-04 DIAGNOSIS — G4720 Circadian rhythm sleep disorder, unspecified type: Secondary | ICD-10-CM

## 2018-07-04 DIAGNOSIS — G4733 Obstructive sleep apnea (adult) (pediatric): Secondary | ICD-10-CM | POA: Diagnosis not present

## 2018-07-04 DIAGNOSIS — G4719 Other hypersomnia: Secondary | ICD-10-CM

## 2018-07-04 DIAGNOSIS — E669 Obesity, unspecified: Secondary | ICD-10-CM

## 2018-07-04 DIAGNOSIS — R519 Headache, unspecified: Secondary | ICD-10-CM

## 2018-07-04 DIAGNOSIS — Z820 Family history of epilepsy and other diseases of the nervous system: Secondary | ICD-10-CM

## 2018-07-05 ENCOUNTER — Other Ambulatory Visit: Payer: Self-pay

## 2018-07-08 ENCOUNTER — Telehealth: Payer: Self-pay

## 2018-07-08 NOTE — Telephone Encounter (Signed)
I called pt. I advised pt that Dr. Rexene Alberts reviewed their sleep study results and found that pt has severe osa but did well with the cpap during the sleep study. Dr. Rexene Alberts recommends that pt start a cpap at home. I reviewed PAP compliance expectations with the pt. Pt is agreeable to starting a CPAP. I advised pt that an order will be sent to a DME, [facility], and [facility] will call the pt within about one week after they file with the pt's insurance. [facility] will show the pt how to use the machine, fit for masks, and troubleshoot the CPAP if needed. A follow up appt was made for insurance purposes with Janett Billow, NP on 10/09/18 at 9:45am. Pt verbalized understanding to arrive 15 minutes early and bring their CPAP. A letter with all of this information in it will be mailed to the pt as a reminder. I verified with the pt that the address we have on file is correct. Pt verbalized understanding of results. Pt had no questions at this time but was encouraged to call back if questions arise. I have sent the order to [facility] and have received confirmation that they have received the order.  Pt reports is coming to pick up her pillow on Thursday. Sleep lab staff will let pt know if this can't work.

## 2018-07-08 NOTE — Addendum Note (Signed)
Addended by: Star Age on: 07/08/2018 08:22 AM   Modules accepted: Orders

## 2018-07-08 NOTE — Progress Notes (Signed)
Patient referred by Darla Lesches, NP, seen by me on 06/26/18, split night sleep study on 07/04/18. Please call and notify patient that the recent sleep study confirmed the diagnosis of severe OSA. She did well with CPAP during the study with significant improvement of the respiratory events. Therefore, I would like start the patient on CPAP therapy at home by prescribing a machine for home use. I placed the order in the chart.  Please advise patient that we need a follow up appointment with either myself or one of our nurse practitioners in about 10 weeks post set-up to check for how the patient is feeling and how well the patient is using the machine, etc. Please go ahead and schedule the appointment, while you have the patient on the phone and make sure patient understands the importance of keeping this window for the FU appointment, as it is often an insurance requirement. Failing to adhere to this may result in losing coverage for sleep apnea treatment, at which point most patients are left with a choice of returning the machine or paying out of pocket (and we want neither of this to happen!).  Please re-enforce the importance of compliance with treatment and the need for [place] to monitor compliance data - again an insurance requirement and usually a good feedback for the patient as far as how they are doing.  Also remind patient, that any PAP machine or mask issues should be first addressed with the DME company, who provided the machine/mask.  Please ask if patient has a preference regarding DME company, may depend on the insurance too.  Please arrange for CPAP set up at home through a DME company of patient's choice.  Once you have spoken to the patient you can close the phone encounter. Please fax/route report to referring provider, thanks,   Star Age, MD, PhD [facility])

## 2018-07-08 NOTE — Telephone Encounter (Signed)
-----   Message from Star Age, MD sent at 07/08/2018  8:22 AM EDT ----- Patient referred by Darla Lesches, NP, seen by me on 06/26/18, split night sleep study on 07/04/18. Please call and notify patient that the recent sleep study confirmed the diagnosis of severe OSA. She did well with CPAP during the study with significant improvement of the respiratory events. Therefore, I would like start the patient on CPAP therapy at home by prescribing a machine for home use. I placed the order in the chart.  Please advise patient that we need a follow up appointment with either myself or one of our nurse practitioners in about 10 weeks post set-up to check for how the patient is feeling and how well the patient is using the machine, etc. Please go ahead and schedule the appointment, while you have the patient on the phone and make sure patient understands the importance of keeping this window for the FU appointment, as it is often an insurance requirement. Failing to adhere to this may result in losing coverage for sleep apnea treatment, at which point most patients are left with a choice of returning the machine or paying out of pocket (and we want neither of this to happen!).  Please re-enforce the importance of compliance with treatment and the need for [place] to monitor compliance data - again an insurance requirement and usually a good feedback for the patient as far as how they are doing.  Also remind patient, that any PAP machine or mask issues should be first addressed with the DME company, who provided the machine/mask.  Please ask if patient has a preference regarding DME company, may depend on the insurance too.  Please arrange for CPAP set up at home through a DME company of patient's choice.  Once you have spoken to the patient you can close the phone encounter. Please fax/route report to referring provider, thanks,   Star Age, MD, PhD [facility])

## 2018-07-08 NOTE — Procedures (Signed)
PATIENT'S NAME:  Aimee Hart, Hlavaty DOB:      1965-08-07      MR#:    [id number]     DATE OF RECORDING: 07/04/2018 REFERRING M.D.:  Darla Lesches, NP Study Performed:  Split-Night Titration Study HISTORY: 53 year old woman with an underlying medical history of reflux disease, rosacea, and obesity, who was previously diagnosed with severe obstructive sleep apnea. She tried CPAP therapy but could not tolerate it because of discomfort with the mask. She reports morning headaches, nocturia and snoring. The patient endorsed the Epworth Sleepiness Scale at 8 points. The patient's weight 196 pounds with a height of 66 (inches), resulting in a BMI of 31.5 kg/m2. The patient's neck circumference measured 15 inches.  CURRENT MEDICATIONS: Xanax, Wellbutrin, Calcium Vit D,  Advil, Metrogel, Prilosec, Trintellix  PROCEDURE:  This is a multichannel digital polysomnogram utilizing the Somnostar 11.2 system.  Electrodes and sensors were applied and monitored per AASM Specifications.   EEG, EOG, Chin and Limb EMG, were sampled at 200 Hz.  ECG, Snore and Nasal Pressure, Thermal Airflow, Respiratory Effort, CPAP Flow and Pressure, Oximetry was sampled at 50 Hz. Digital video and audio were recorded.      BASELINE STUDY WITHOUT CPAP RESULTS:  Lights Out was at 21:27 and Lights On at 04:22, for the night, split study started at 23:39, epoch 284. Total recording time (TRT) was 131, with a total sleep time (TST) of 83.5 minutes. The patient's sleep latency was 19.5 minutes. REM sleep was absent prior to PAP. The sleep efficiency was 63.7 %.    SLEEP ARCHITECTURE: WASO (Wake after sleep onset) was 36 minutes with mild to moderate sleep fragmentation noted, Stage N1 was 22 minutes, Stage N2 was 59.5 minutes, Stage N3 was 2 minutes and Stage R (REM sleep) was 0 minutes. The percentages were Stage N1 26.3%, Stage N2 71.3%, both increased, Stage N3 was 2.4% and Stage R (REM sleep) was absent. The arousals were noted as: 2 were  spontaneous, 0 were associated with PLMs, 99 were associated with respiratory events.  RESPIRATORY ANALYSIS:  There were a total of 110 respiratory events:  21 obstructive apneas, 0 central apneas and 0 mixed apneas with a total of 21 apneas and an apnea index (AI) of 15.1. There were 89 hypopneas with a hypopnea index of 64.. The patient also had 0 respiratory event related arousals (RERAs).  Snoring was noted.     The total APNEA/HYPOPNEA INDEX (AHI) was 79/hour and the total RESPIRATORY DISTURBANCE INDEX was 79. /hour.  0 events occurred in REM sleep and 178 events in NREM. The REM AHI was n/a versus a non-REM AHI of 79. /hour. The patient did not sleep supine, non-supine AHI of 79.1 /hour.  OXYGEN SATURATION & C02:  The wake baseline 02 saturation was 96%, with the lowest being 88%. Time spent below 89% saturation equaled 1 minutes.  PERIODIC LIMB MOVEMENTS: The patient had a total of 0 Periodic Limb Movements.  The Periodic Limb Movement (PLM) index was 0 /hour and the PLM Arousal index was 0 /hour.   Audio and video analysis did not show any abnormal or unusual movements, behaviors, phonations or vocalizations. The patient took 1 bathroom break. Moderate snoring was noted. The EKG was in keeping with normal sinus rhythm (NSR).   TITRATION STUDY WITH CPAP RESULTS:   The patient was started on P10 nasal pillows, but later switched to a medium Dreamwear FFM due to mouth venting noted. CPAP was initiated at 5 cmH20 with heated humidity  per AASM split night standards and pressure was advanced to 16 cmH20 with an EPR of 3 because of hypopneas, apneas and desaturations.  At a PAP pressure of 16 cmH20, there was a reduction of the AHI to 1.2/hour with supine REM sleep achieved and O2 nadir of 91%.   Total recording time (TRT) was 284.5 minutes, with a total sleep time (TST) of 193 minutes. The patient's sleep latency was 6.5 minutes. REM latency was 92 minutes.  The sleep efficiency was 67.8%.     SLEEP ARCHITECTURE: Wake after sleep was 49 minutes, Stage N1 42.5 minutes, Stage N2 62.5 minutes, Stage N3 40 minutes and Stage R (REM sleep) 48 minutes. The percentages were: Stage N1 22.%, Stage N2 32.4%, Stage N3 20.7% and Stage R (REM sleep) 24.9%. The arousals were noted as: 22 were spontaneous, 0 were associated with PLMs, 47 were associated with respiratory events.  RESPIRATORY ANALYSIS:  There were a total of 72 respiratory events: 3 obstructive apneas, 8 central apneas and 0 mixed apneas with a total of 11 apneas and an apnea index (AI) of 3.4. There were 61 hypopneas with a hypopnea index of 19. /hour. The patient also had 0 respiratory event related arousals (RERAs).      The total APNEA/HYPOPNEA INDEX  (AHI) was 22.4 /hour and the total RESPIRATORY DISTURBANCE INDEX was 22.4 /hour.  0 events occurred in REM sleep and 72 events in NREM. The REM AHI was 0 /hour versus a non-REM AHI of 29.8 /hour. REM sleep was achieved on a pressure of  cm/h2o (AHI was  .) The patient spent 99% of total sleep time in the supine position. The supine AHI was 21.6 /hour, versus a non-supine AHI of 120.0/hour.  OXYGEN SATURATION & C02:  The wake baseline 02 saturation was 96%, with the lowest being 88%. Time spent below 89% saturation equaled 1 minutes.  PERIODIC LIMB MOVEMENTS: The patient had a total of 0 Periodic Limb Movements. The Periodic Limb Movement (PLM) index was 0 /hour and the PLM Arousal index was 0 /hour.  Post-study, the patient indicated that sleep was the same as usual.  POLYSOMNOGRAPHY IMPRESSION :   1. Obstructive Sleep Apnea (OSA)  2. Dysfunctions associated with sleep stages or arousals from sleep RECOMMENDATIONS:  1. This patient has severe obstructive sleep apnea and responded well on CPAP therapy. I will recommend home CPAP treatment at a pressure of 16 cm via medium medium Dreamwear full face mask (which was used during this study) with heated humidity. The patient should be  reminded to be fully compliant with PAP therapy to improve sleep related symptoms and decrease long term cardiovascular risks. Please note that untreated obstructive sleep apnea may carry additional perioperative morbidity. Patients with significant obstructive sleep apnea should receive perioperative PAP therapy and the surgeons and particularly the anesthesiologist should be informed of the diagnosis and the severity of the sleep disordered breathing. 2. This study shows sleep fragmentation and abnormal sleep stage percentages; these are nonspecific findings and per se do not signify an intrinsic sleep disorder or a cause for the patient's sleep-related symptoms. Causes include (but are not limited to) the first night effect of the sleep study, circadian rhythm disturbances, medication effect or an underlying mood disorder or medical problem.  3. The patient should be cautioned not to drive, work at heights, or operate dangerous or heavy equipment when tired or sleepy. Review and reiteration of good sleep hygiene measures should be pursued with any patient. 4. The patient  will be seen in follow-up in the sleep clinic at [facility] for discussion of the test results, symptom and treatment compliance review, further management strategies, etc. The referring provider will be notified of the test results.  I certify that I have reviewed the entire raw data recording prior to the issuance of this report in accordance with the Standards of Accreditation of the American Academy of Sleep Medicine (AASM)    Star Age, MD, PhD Diplomat, American Board of Neurology and Sleep Medicine (Neurology and Sleep Medicine)

## 2018-07-09 NOTE — Telephone Encounter (Signed)
Pt's pillow is in sleep lab. Will be waiting for her to pick up. Thanks!

## 2018-07-14 DIAGNOSIS — G4733 Obstructive sleep apnea (adult) (pediatric): Secondary | ICD-10-CM | POA: Diagnosis not present

## 2018-07-18 MED FILL — buPROPion HCL ER (XL) 300 M: 300 | 90 days supply | Qty: 90 | Fill #2

## 2018-07-23 DIAGNOSIS — Z1231 Encounter for screening mammogram for malignant neoplasm of breast: Secondary | ICD-10-CM | POA: Diagnosis not present

## 2018-07-23 DIAGNOSIS — Z01419 Encounter for gynecological examination (general) (routine) without abnormal findings: Secondary | ICD-10-CM | POA: Diagnosis not present

## 2018-07-23 DIAGNOSIS — Z6832 Body mass index (BMI) 32.0-32.9, adult: Secondary | ICD-10-CM | POA: Diagnosis not present

## 2018-07-29 DIAGNOSIS — M25511 Pain in right shoulder: Secondary | ICD-10-CM | POA: Diagnosis not present

## 2018-08-14 DIAGNOSIS — G4733 Obstructive sleep apnea (adult) (pediatric): Secondary | ICD-10-CM | POA: Diagnosis not present

## 2018-09-10 MED FILL — TRINTELLIX 10 MG TABLET: 10 | 90 days supply | Qty: 90 | Fill #1

## 2018-09-13 DIAGNOSIS — G4733 Obstructive sleep apnea (adult) (pediatric): Secondary | ICD-10-CM | POA: Diagnosis not present

## 2018-09-25 ENCOUNTER — Telehealth: Payer: Self-pay | Admitting: Family Medicine

## 2018-10-06 ENCOUNTER — Telehealth: Payer: Self-pay

## 2018-10-06 NOTE — Telephone Encounter (Signed)
I called pt that her visit with Janett Billow NP on June 18 will be a mychart video visit due to [place] 19. Pt gave verbal consent to do video and to file insurance. Pt stated she was sent a email to create a mychart but will have to find the email. I was unable to resend pt activation link because it was sent two weeks ago. I advise pt to call mychart help desk and they can resend her link to create her account. PT stated she has not worn her mask in two weeks because it broke. Her insurance will not allow her to get a new mask tilll 10/13/2018. PT has a smart phone to do video visit.

## 2018-10-08 ENCOUNTER — Other Ambulatory Visit: Payer: Self-pay | Admitting: Internal Medicine

## 2018-10-08 ENCOUNTER — Other Ambulatory Visit: Payer: 59

## 2018-10-08 DIAGNOSIS — Z20822 Contact with and (suspected) exposure to covid-19: Secondary | ICD-10-CM

## 2018-10-09 ENCOUNTER — Telehealth: Payer: Self-pay | Admitting: Adult Health

## 2018-10-09 NOTE — Progress Notes (Deleted)
PATIENT: Aimee Hart DOB: Sep 12, 1965  REASON FOR VISIT: Initial CPAP follow-up HISTORY FROM: patient  Virtual Visit via Video Note  I connected with Leroy Sea on 10/09/18 at  9:45 AM EDT by a video enabled telemedicine application located remotely in my own home and verified that I am speaking with the correct person using two identifiers who was located at their own home.   I discussed the limitations of evaluation and management by telemedicine and the availability of in person appointments. The patient expressed understanding and agreed to proceed.    HISTORY OF PRESENT ILLNESS:  Aimee Hart is a 53 y.o. female who is being seen today for initial CPAP compliance visit.  Initially scheduled as office visit but due to COVID-19 safety precautions, visit transition to telemedicine via MyChart with patient's consent.  She was initially evaluated by Dr. Rexene Alberts on 06/26/2018 with reports of snoring and excessive daytime somnolence.  She had previously been diagnosed with severe sleep apnea possibly greater than 10 years prior with trial of CPAP therapy only used for total of 3 weeks as she had difficulty tolerating but willing to retrial if need arises.  Initial Epworth Sleepiness Scale 8/24 and fatigue score 29/63.  It was recommended for her to undergo reevaluation of sleep apnea with split-night study. She underwent split-night study on 07/04/2018 which showed severe obstructive sleep apnea and responded well to CPAP therapy.  Recommended initiating CPAP treatment at a pressure of 16cmH2O.   Review of compliance report from 09/08/2018 -10/07/2018 shows 21 out of 30 usage days with 11 days greater than 4 hours for overall compliance of 37%.  Average usage 4 hours and 14 minutes with a residual AHI 2.5.  Leaks in the 95th percentile 39.4 on a set pressure of 16 cm H2O and EPR level 3.         REVIEW OF SYSTEMS: Out of a complete 14 system review of symptoms, the patient  complains only of the following symptoms, and all other reviewed systems are negative.  ALLERGIES: No Known Allergies  HOME MEDICATIONS: Outpatient Medications Prior to Visit  Medication Sig Dispense Refill  . ALPRAZolam (XANAX) 0.5 MG tablet Take 0.5 mg by mouth 2 (two) times daily as needed for anxiety.  1  . buPROPion (WELLBUTRIN XL) 300 MG 24 hr tablet Take 300 mg by mouth every morning.     . Calcium Carbonate-Vit D-Min (CALCIUM 1200 PO) Take 1,200 mg by mouth daily.    Marland Kitchen ibuprofen (ADVIL,MOTRIN) 200 MG tablet Take 600 mg by mouth every 6 (six) hours as needed for headache, mild pain or moderate pain.    . metroNIDAZOLE (METROGEL) 0.75 % gel Apply 1 application topically 2 (two) times daily. 45 g 0  . omeprazole (PRILOSEC) 10 MG capsule Take 10 mg by mouth daily.    . TRINTELLIX 10 MG TABS tablet TAKE 1 TABLET BY MOUTH ONCE DAILY 90 tablet 1   No facility-administered medications prior to visit.     PAST MEDICAL HISTORY: Past Medical History:  Diagnosis Date  . Depression   . GERD (gastroesophageal reflux disease)   . Hypercholesteremia   . Sleep apnea     PAST SURGICAL HISTORY: Past Surgical History:  Procedure Laterality Date  . LAPAROSCOPIC ROUX-EN-Y GASTRIC BYPASS WITH UPPER ENDOSCOPY AND REMOVAL OF LAP BAND  2008  . TONSILLECTOMY  1980    FAMILY HISTORY: Family History  Problem Relation Age of Onset  . Melanoma Mother   . Hypertension  Mother   . Depression Mother   . Thyroid disease Mother   . Hypertension Father   . Heart disease Father   . Thyroid disease Father   . Cancer Sister        bladder  . Post-traumatic stress disorder Brother   . Kidney disease Daughter   . Cancer Maternal Grandmother        lung  . Emphysema Maternal Grandfather   . Alzheimer's disease Paternal Grandmother   . Heart disease Paternal Grandfather     SOCIAL HISTORY: Social History   Socioeconomic History  . Marital status: Divorced    Spouse name: Not on file  .  Number of children: 2  . Years of education: college  . Highest education level: Not on file  Occupational History    Comment: RN  Social Needs  . Financial resource strain: Not on file  . Food insecurity    Worry: Not on file    Inability: Not on file  . Transportation needs    Medical: Not on file    Non-medical: Not on file  Tobacco Use  . Smoking status: Former Smoker    Packs/day: 1.00    Years: 10.00    Pack years: 10.00    Types: Cigarettes    Quit date: 02/07/1996    Years since quitting: 22.6  . Smokeless tobacco: Never Used  Substance and Sexual Activity  . Alcohol use: Yes    Comment: 1/week  . Drug use: Never  . Sexual activity: Yes  Lifestyle  . Physical activity    Days per week: Not on file    Minutes per session: Not on file  . Stress: Not on file  Relationships  . Social Herbalist on phone: Not on file    Gets together: Not on file    Attends religious service: Not on file    Active member of club or organization: Not on file    Attends meetings of clubs or organizations: Not on file    Relationship status: Not on file  . Intimate partner violence    Fear of current or ex partner: Not on file    Emotionally abused: Not on file    Physically abused: Not on file    Forced sexual activity: Not on file  Other Topics Concern  . Not on file  Social History Narrative   Lives with son   Caffeine 16 oz daily      PHYSICAL EXAM  General: well developed, well nourished, pleasant ***, seated, in no evident distress Head: head normocephalic and atraumatic.    Neurologic Exam Mental Status: Awake and fully alert. Oriented to place and time. Recent and remote memory intact. Attention span, concentration and fund of knowledge appropriate. Mood and affect appropriate.  Cranial Nerves: Extraocular movements full without nystagmus. Hearing intact to voice. Facial sensation intact. Face, tongue, palate moves normally and symmetrically.  Shoulder  shrug symmetric. Motor: No evidence of weakness per drift assessment Sensory.: intact to light touch Coordination: Rapid alternating movements normal in all extremities. Finger-to-nose and heel-to-shin performed accurately bilaterally. Gait and Station: Arises from chair without difficulty. Stance is normal. Gait demonstrates normal stride length and balance .  Reflexes: UTA    DIAGNOSTIC DATA (LABS, IMAGING, TESTING) - I reviewed patient records, labs, notes, testing and imaging myself where available.  Split-night study 07/04/2018 POLYSOMNOGRAPHY IMPRESSION :  1. Obstructive Sleep Apnea (OSA)  2. Dysfunctions associated with sleep stages or arousals from  sleep     Lab Results  Component Value Date   WBC 5.5 05/14/2018   HGB 12.9 05/14/2018   HCT 37.7 05/14/2018   MCV 83 05/14/2018   PLT 344 05/14/2018      Component Value Date/Time   NA 143 05/14/2018 1029   K 4.7 05/14/2018 1029   CL 106 05/14/2018 1029   CO2 24 05/14/2018 1029   GLUCOSE 92 05/14/2018 1029   GLUCOSE 99 01/14/2018 0756   BUN 15 05/14/2018 1029   CREATININE 1.25 (H) 05/14/2018 1029   CALCIUM 9.2 05/14/2018 1029   PROT 6.6 05/14/2018 1029   ALBUMIN 4.4 05/14/2018 1029   AST 11 05/14/2018 1029   ALT 9 05/14/2018 1029   ALKPHOS 56 05/14/2018 1029   BILITOT 0.3 05/14/2018 1029   GFRNONAA 49 (L) 05/14/2018 1029   GFRAA 57 (L) 05/14/2018 1029   Lab Results  Component Value Date   CHOL 221 (H) 05/14/2018   HDL 58 05/14/2018   LDLCALC 144 (H) 05/14/2018   TRIG 94 05/14/2018   CHOLHDL 3.8 05/14/2018   No results found for: HGBA1C No results found for: VITAMINB12 Lab Results  Component Value Date   TSH 1.450 05/14/2018      ASSESSMENT AND PLAN 53 y.o. year old female  has a past medical history of Depression, GERD (gastroesophageal reflux disease), Hypercholesteremia, and Sleep apnea. here with complaints of snoring and excessive daytime somnolence with prior diagnosis of OSA.  Repeat sleep  study on 07/04/2018 showed severe OSA and recommended initiating CPAP for management.      Greater than 50% of this 15-minute visit was spent discussing CPAP compliance report   Venancio Poisson, MSN, AGNP-BC 10/09/2018, 8:13 AM [facility] [address], [place], [place] [address] [phone]

## 2018-10-14 DIAGNOSIS — G4733 Obstructive sleep apnea (adult) (pediatric): Secondary | ICD-10-CM | POA: Diagnosis not present

## 2018-10-22 MED FILL — buPROPion HCL ER (XL) 300 M: 300 | 90 days supply | Qty: 90 | Fill #3

## 2018-10-27 DIAGNOSIS — G4733 Obstructive sleep apnea (adult) (pediatric): Secondary | ICD-10-CM | POA: Diagnosis not present

## 2018-11-10 ENCOUNTER — Ambulatory Visit: Payer: 59 | Admitting: Physician Assistant

## 2018-11-13 DIAGNOSIS — G4733 Obstructive sleep apnea (adult) (pediatric): Secondary | ICD-10-CM | POA: Diagnosis not present

## 2018-12-14 DIAGNOSIS — G4733 Obstructive sleep apnea (adult) (pediatric): Secondary | ICD-10-CM | POA: Diagnosis not present

## 2018-12-16 ENCOUNTER — Ambulatory Visit: Payer: 59 | Admitting: Physician Assistant

## 2018-12-22 ENCOUNTER — Other Ambulatory Visit: Payer: Self-pay | Admitting: Physician Assistant

## 2018-12-22 MED FILL — TRINTELLIX 10 MG TABLET: 10 | 90 days supply | Qty: 90 | Fill #0

## 2018-12-24 ENCOUNTER — Other Ambulatory Visit: Payer: Self-pay

## 2018-12-25 ENCOUNTER — Encounter: Payer: Self-pay | Admitting: Family Medicine

## 2018-12-25 ENCOUNTER — Ambulatory Visit (INDEPENDENT_AMBULATORY_CARE_PROVIDER_SITE_OTHER): Payer: 59 | Admitting: Family Medicine

## 2018-12-25 ENCOUNTER — Ambulatory Visit: Payer: 59 | Admitting: Family Medicine

## 2018-12-25 VITALS — BP 113/69 | HR 84 | Temp 99.3°F | Ht 65.5 in | Wt 207.0 lb

## 2018-12-25 DIAGNOSIS — K219 Gastro-esophageal reflux disease without esophagitis: Secondary | ICD-10-CM | POA: Diagnosis not present

## 2018-12-25 DIAGNOSIS — E669 Obesity, unspecified: Secondary | ICD-10-CM

## 2018-12-25 DIAGNOSIS — L700 Acne vulgaris: Secondary | ICD-10-CM | POA: Diagnosis not present

## 2018-12-25 DIAGNOSIS — E7800 Pure hypercholesterolemia, unspecified: Secondary | ICD-10-CM | POA: Diagnosis not present

## 2018-12-25 MED ORDER — CONTRAVE 8-90 MG PO TB12: ORAL_TABLET | ORAL | 0 refills | Status: DC

## 2018-12-25 MED ORDER — CLINDAMYCIN PHOS-BENZOYL PEROX 1-5 % EX GEL: Freq: Two times a day (BID) | CUTANEOUS | 0 refills | Status: DC

## 2018-12-25 MED FILL — CLINDAMYCIN PHOS-BENZOYL PE: 1-5 | 30 days supply | Qty: 25 | Fill #0

## 2018-12-25 NOTE — Patient Instructions (Signed)
Daily Weight Record It is important to weigh yourself daily. To do this:  Make sure you use a reliable scale. Use the same scale each day.  Keep this daily weight chart near your scale.  Weigh yourself each morning at the same time.  Before weighing yourself: ? Take off your shoes. ? Make sure you are wearing the same amount of clothing each day.  Write down your weight in the spaces on the form.  Compare today's weight to yesterday's weight.  Bring this form with you to your follow-up visits with your health care provider. Call your health care provider if you have concerns about your weight, including rapid weight gain or loss. Date: ________ Weight: ____________________ Date: ________ Weight: ____________________ Date: ________ Weight: ____________________ Date: ________ Weight: ____________________ Date: ________ Weight: ____________________ Date: ________ Weight: ____________________ Date: ________ Weight: ____________________ Date: ________ Weight: ____________________ Date: ________ Weight: ____________________ Date: ________ Weight: ____________________ Date: ________ Weight: ____________________ Date: ________ Weight: ____________________ Date: ________ Weight: ____________________ Date: ________ Weight: ____________________ Date: ________ Weight: ____________________ Date: ________ Weight: ____________________ Date: ________ Weight: ____________________ Date: ________ Weight: ____________________ Date: ________ Weight: ____________________ Date: ________ Weight: ____________________ Date: ________ Weight: ____________________ Date: ________ Weight: ____________________ Date: ________ Weight: ____________________ Date: ________ Weight: ____________________ Date: ________ Weight: ____________________ Date: ________ Weight: ____________________ Date: ________ Weight: ____________________ Date: ________ Weight: ____________________ Date: ________ Weight: ____________________  Date: ________ Weight: ____________________ Date: ________ Weight: ____________________ Date: ________ Weight: ____________________ Date: ________ Weight: ____________________ Date: ________ Weight: ____________________ Date: ________ Weight: ____________________ Date: ________ Weight: ____________________ Date: ________ Weight: ____________________ Date: ________ Weight: ____________________ Date: ________ Weight: ____________________ Date: ________ Weight: ____________________ Date: ________ Weight: ____________________ Date: ________ Weight: ____________________ Date: ________ Weight: ____________________ Date: ________ Weight: ____________________ Date: ________ Weight: ____________________ Date: ________ Weight: ____________________ Date: ________ Weight: ____________________ Date: ________ Weight: ____________________ Date: ________ Weight: ____________________ Date: ________ Weight: ____________________ This information is not intended to replace advice given to you by your health care provider. Make sure you discuss any questions you have with your health care provider. Document Released: 06/21/2006 Document Revised: 04/08/2017 Document Reviewed: 04/08/2017 Elsevier Patient Education  2020 [employer].

## 2018-12-25 NOTE — Progress Notes (Signed)
Subjective:  Patient ID: Aimee Hart, female    DOB: 1965-11-21, 53 y.o.   MRN: [id number]  Patient Care Team: Baruch Gouty, FNP as PCP - General (Family Medicine)   Chief Complaint:  Medical Management of Chronic Issues (6 mo)   HPI: Aimee Hart is a 53 y.o. female presenting on 12/25/2018 for Medical Management of Chronic Issues (6 mo)   1. GERD without esophagitis  Insurance would not cover Dexilant. Pt has been taking OTC medications for control.  Current medications - omeprazole Cough - No Sore throat - No Voice change - No Hemoptysis - No Dysphagia or dyspepsia - No Water brash - No Red Flags (weight loss, hematochezia, melena, weight loss, early satiety, fevers, odynophagia, or persistent vomiting) - No   2. Obesity (BMI 30-39.9)  Ongoing weight gain and increased cravings. Pt has gained 13 pounds since January despite dieting and exercising. Pt states she has significant cravings at night and finds it very hard to suppress these cravings. States she gives in to the cravings often. She has been trying to eat more healthy but finds herself snacking often.    3. Cystic acne  Pt complains of acne. States this has been worsening over the last several months and mask use has exacerbated her symptoms. She states the bumps are tender to touch and usually do not come to a head. She has tried several over the counter acne medications and Metrogel without relief of symptoms.    4. Pure hypercholesterolemia  Diet and exercise initiated in January. Will recheck levels today to see if they have been beneficial.  Diet - generally healthy, does snack a lot Exercise - active daily, no regular routine  Lab Results  Component Value Date   CHOL 221 (H) 05/14/2018   HDL 58 05/14/2018   LDLCALC 144 (H) 05/14/2018   TRIG 94 05/14/2018   CHOLHDL 3.8 05/14/2018     Family and personal medical history reviewed. Smoking and ETOH history reviewed.       Relevant past  medical, surgical, family, and social history reviewed and updated as indicated.  Allergies and medications reviewed and updated. Date reviewed: Chart in Epic.   Past Medical History:  Diagnosis Date  . Depression   . GERD (gastroesophageal reflux disease)   . Hypercholesteremia   . Sleep apnea     Past Surgical History:  Procedure Laterality Date  . LAPAROSCOPIC ROUX-EN-Y GASTRIC BYPASS WITH UPPER ENDOSCOPY AND REMOVAL OF LAP BAND  2008  . TONSILLECTOMY  1980    Social History   Socioeconomic History  . Marital status: Divorced    Spouse name: Not on file  . Number of children: 2  . Years of education: college  . Highest education level: Not on file  Occupational History    Comment: RN  Social Needs  . Financial resource strain: Not on file  . Food insecurity    Worry: Not on file    Inability: Not on file  . Transportation needs    Medical: Not on file    Non-medical: Not on file  Tobacco Use  . Smoking status: Former Smoker    Packs/day: 1.00    Years: 10.00    Pack years: 10.00    Types: Cigarettes    Quit date: 02/07/1996    Years since quitting: 22.8  . Smokeless tobacco: Never Used  Substance and Sexual Activity  . Alcohol use: Yes    Comment: 1/week  . Drug  use: Never  . Sexual activity: Yes  Lifestyle  . Physical activity    Days per week: Not on file    Minutes per session: Not on file  . Stress: Not on file  Relationships  . Social Herbalist on phone: Not on file    Gets together: Not on file    Attends religious service: Not on file    Active member of club or organization: Not on file    Attends meetings of clubs or organizations: Not on file    Relationship status: Not on file  . Intimate partner violence    Fear of current or ex partner: Not on file    Emotionally abused: Not on file    Physically abused: Not on file    Forced sexual activity: Not on file  Other Topics Concern  . Not on file  Social History Narrative    Lives with son   Caffeine 16 oz daily    Outpatient Encounter Medications as of 12/25/2018  Medication Sig  . ALPRAZolam (XANAX) 0.5 MG tablet Take 0.5 mg by mouth 2 (two) times daily as needed for anxiety.  Marland Kitchen buPROPion (WELLBUTRIN XL) 300 MG 24 hr tablet Take 300 mg by mouth every morning.   . Calcium Carbonate-Vit D-Min (CALCIUM 1200 PO) Take 1,200 mg by mouth daily.  Marland Kitchen ibuprofen (ADVIL,MOTRIN) 200 MG tablet Take 600 mg by mouth every 6 (six) hours as needed for headache, mild pain or moderate pain.  Marland Kitchen omeprazole (PRILOSEC) 10 MG capsule Take 10 mg by mouth daily.  . TRINTELLIX 10 MG TABS tablet TAKE 1 TABLET BY MOUTH ONCE DAILY  . clindamycin-benzoyl peroxide (BENZACLIN) gel Apply topically 2 (two) times daily.  . Naltrexone-buPROPion HCl ER (CONTRAVE) 8-90 MG TB12 1 tablet Po Qam x7d ; then 1 tab po BID x 7 d; then 2 tab in AM and 1 in PM X7d; Then 2 po BID from then on  . [DISCONTINUED] metroNIDAZOLE (METROGEL) 0.75 % gel Apply 1 application topically 2 (two) times daily.   No facility-administered encounter medications on file as of 12/25/2018.     No Known Allergies  Review of Systems  Constitutional: Positive for unexpected weight change. Negative for activity change, appetite change, chills, diaphoresis, fatigue and fever.  HENT: Negative.  Negative for sore throat, trouble swallowing and voice change.   Eyes: Negative.  Negative for photophobia and visual disturbance.  Respiratory: Negative for cough, chest tightness and shortness of breath.   Cardiovascular: Negative for chest pain, palpitations and leg swelling.  Gastrointestinal: Negative for abdominal distention, abdominal pain, anal bleeding, blood in stool, constipation, diarrhea, nausea, rectal pain and vomiting.  Endocrine: Negative.  Negative for cold intolerance, heat intolerance, polydipsia, polyphagia and polyuria.  Genitourinary: Negative for decreased urine volume, difficulty urinating, dysuria, frequency, hematuria  and urgency.  Musculoskeletal: Negative for arthralgias and myalgias.  Skin: Negative.  Negative for color change.  Allergic/Immunologic: Negative.   Neurological: Negative for dizziness, tremors, seizures, syncope, facial asymmetry, speech difficulty, light-headedness, numbness and headaches.  Hematological: Negative.  Does not bruise/bleed easily.  Psychiatric/Behavioral: Negative for agitation, behavioral problems, confusion, decreased concentration, dysphoric mood, hallucinations, self-injury, sleep disturbance and suicidal ideas. The patient is not nervous/anxious and is not hyperactive.   All other systems reviewed and are negative.       Objective:  BP 113/69   Pulse 84   Temp 99.3 F (37.4 C)   Ht 5' 5.5" (1.664 m)   Wt 207 lb (  93.9 kg)   LMP 12/07/2018   BMI 33.92 kg/m    Wt Readings from Last 3 Encounters:  12/25/18 207 lb (93.9 kg)  06/26/18 196 lb (88.9 kg)  05/14/18 194 lb (88 kg)    Physical Exam Vitals signs and nursing note reviewed.  Constitutional:      General: She is not in acute distress.    Appearance: Normal appearance. She is well-developed and well-groomed. She is obese. She is not ill-appearing, toxic-appearing or diaphoretic.  HENT:     Head: Normocephalic and atraumatic.     Jaw: There is normal jaw occlusion.     Right Ear: Hearing normal.     Left Ear: Hearing normal.     Nose: Nose normal.     Mouth/Throat:     Lips: Pink.     Mouth: Mucous membranes are moist.     Pharynx: Oropharynx is clear. Uvula midline.  Eyes:     General: Lids are normal.     Extraocular Movements: Extraocular movements intact.     Conjunctiva/sclera: Conjunctivae normal.     Pupils: Pupils are equal, round, and reactive to light.  Neck:     Musculoskeletal: Normal range of motion and neck supple.     Thyroid: No thyroid mass, thyromegaly or thyroid tenderness.     Vascular: No carotid bruit or JVD.     Trachea: Trachea and phonation normal.  Cardiovascular:      Rate and Rhythm: Normal rate and regular rhythm.     Chest Wall: PMI is not displaced.     Pulses: Normal pulses.     Heart sounds: Normal heart sounds. No murmur. No friction rub. No gallop.   Pulmonary:     Effort: Pulmonary effort is normal. No respiratory distress.     Breath sounds: Normal breath sounds. No wheezing.  Abdominal:     General: Bowel sounds are normal. There is no distension or abdominal bruit.     Palpations: Abdomen is soft. There is no hepatomegaly or splenomegaly.     Tenderness: There is no abdominal tenderness. There is no right CVA tenderness or left CVA tenderness.     Hernia: No hernia is present.  Musculoskeletal: Normal range of motion.     Right lower leg: No edema.     Left lower leg: No edema.  Lymphadenopathy:     Cervical: No cervical adenopathy.  Skin:    General: Skin is warm and dry.     Capillary Refill: Capillary refill takes less than 2 seconds.     Coloration: Skin is not cyanotic, jaundiced or pale.     Findings: Acne and rash present. Rash is nodular, papular and pustular.     Comments: Mild comedonal, nodular, and papulopustular acne to cheeks, chin, and nose.   Neurological:     General: No focal deficit present.     Mental Status: She is alert and oriented to person, place, and time.     Cranial Nerves: Cranial nerves are intact.     Sensory: Sensation is intact.     Motor: Motor function is intact.     Coordination: Coordination is intact.     Gait: Gait is intact.     Deep Tendon Reflexes: Reflexes are normal and symmetric.  Psychiatric:        Attention and Perception: Attention and perception normal.        Mood and Affect: Mood and affect normal.        Speech: Speech normal.  Behavior: Behavior normal. Behavior is cooperative.        Thought Content: Thought content normal.        Cognition and Memory: Cognition and memory normal.        Judgment: Judgment normal.     Results for orders placed or performed in  visit on 05/14/18  CBC with Differential/Platelet  Result Value Ref Range   WBC 5.5 3.4 - 10.8 x10E3/uL   RBC 4.57 3.77 - 5.28 x10E6/uL   Hemoglobin 12.9 11.1 - 15.9 g/dL   Hematocrit 37.7 34.0 - 46.6 %   MCV 83 79 - 97 fL   MCH 28.2 26.6 - 33.0 pg   MCHC 34.2 31.5 - 35.7 g/dL   RDW 13.5 11.7 - 15.4 %   Platelets 344 150 - 450 x10E3/uL   Neutrophils 64 Not Estab. %   Lymphs 23 Not Estab. %   Monocytes 8 Not Estab. %   Eos 3 Not Estab. %   Basos 2 Not Estab. %   Neutrophils Absolute 3.6 1.4 - 7.0 x10E3/uL   Lymphocytes Absolute 1.2 0.7 - 3.1 x10E3/uL   Monocytes Absolute 0.4 0.1 - 0.9 x10E3/uL   EOS (ABSOLUTE) 0.2 0.0 - 0.4 x10E3/uL   Basophils Absolute 0.1 0.0 - 0.2 x10E3/uL   Immature Granulocytes 0 Not Estab. %   Immature Grans (Abs) 0.0 0.0 - 0.1 x10E3/uL  CMP14+EGFR  Result Value Ref Range   Glucose 92 65 - 99 mg/dL   BUN 15 6 - 24 mg/dL   Creatinine, Ser 1.25 (H) 0.57 - 1.00 mg/dL   GFR calc non Af Amer 49 (L) >59 mL/min/1.73   GFR calc Af Amer 57 (L) >59 mL/min/1.73   BUN/Creatinine Ratio 12 9 - 23   Sodium 143 134 - 144 mmol/L   Potassium 4.7 3.5 - 5.2 mmol/L   Chloride 106 96 - 106 mmol/L   CO2 24 20 - 29 mmol/L   Calcium 9.2 8.7 - 10.2 mg/dL   Total Protein 6.6 6.0 - 8.5 g/dL   Albumin 4.4 3.8 - 4.9 g/dL   Globulin, Total 2.2 1.5 - 4.5 g/dL   Albumin/Globulin Ratio 2.0 1.2 - 2.2   Bilirubin Total 0.3 0.0 - 1.2 mg/dL   Alkaline Phosphatase 56 39 - 117 IU/L   AST 11 0 - 40 IU/L   ALT 9 0 - 32 IU/L  TSH  Result Value Ref Range   TSH 1.450 0.450 - 4.500 uIU/mL  Lipid panel  Result Value Ref Range   Cholesterol, Total 221 (H) 100 - 199 mg/dL   Triglycerides 94 0 - 149 mg/dL   HDL 58 >39 mg/dL   VLDL Cholesterol Cal 19 5 - 40 mg/dL   LDL Calculated 144 (H) 0 - 99 mg/dL   Chol/HDL Ratio 3.8 0.0 - 4.4 ratio  HIV Antibody (routine testing w rflx)  Result Value Ref Range   HIV Screen 4th Generation wRfx Non Reactive Non Reactive       Pertinent labs &  imaging results that were available during my care of the patient were reviewed by me and considered in my medical decision making.  Assessment & Plan:  Aimee Hart was seen today for medical management of chronic issues.  Diagnoses and all orders for this visit:  GERD without esophagitis Well controlled with over the counter medications. Insurance would not cover Dexilant but pt is managing symptoms with OTC medications. No red flags.   Obesity (BMI 30-39.9) 13 pound weight gain since January. Has been  dieting and exercising without success. Does have binge eating tendencies at night. Has difficulty suppressing cravings. Will trial Contrave. Pt to use My Fitness Pal app to keep tack of calories and exercise. Follow up in 3 months or sooner if needed.  -     Naltrexone-buPROPion HCl ER (CONTRAVE) 8-90 MG TB12; 1 tablet Po Qam x7d ; then 1 tab po BID x 7 d; then 2 tab in AM and 1 in PM X7d; Then 2 po BID from then on -     CMP14+EGFR -     Lipid panel  Cystic acne -     clindamycin-benzoyl peroxide (BENZACLIN) gel; Apply topically 2 (two) times daily.  Pure hypercholesterolemia -     Lipid panel     Continue all other maintenance medications.  Follow up plan: Return in about 3 months (around 03/26/2019), or if symptoms worsen or fail to improve, for weight management.  Continue healthy lifestyle choices, including diet (rich in fruits, vegetables, and lean proteins, and low in salt and simple carbohydrates) and exercise (at least 30 minutes of moderate physical activity daily).  Educational handout given for weight management   The above assessment and management plan was discussed with the patient. The patient verbalized understanding of and has agreed to the management plan. Patient is aware to call the clinic if symptoms persist or worsen. Patient is aware when to return to the clinic for a follow-up visit. Patient educated on when it is appropriate to go to the emergency department.    Monia Pouch, FNP-C [facility] [phone]

## 2018-12-26 LAB — CMP14+EGFR
ALT: 10 IU/L (ref 0–32)
AST: 15 IU/L (ref 0–40)
Albumin/Globulin Ratio: 1.6 (ref 1.2–2.2)
Albumin: 3.8 g/dL (ref 3.8–4.9)
Alkaline Phosphatase: 59 IU/L (ref 39–117)
BUN/Creatinine Ratio: 16 (ref 9–23)
BUN: 14 mg/dL (ref 6–24)
Bilirubin Total: 0.3 mg/dL (ref 0.0–1.2)
CO2: 24 mmol/L (ref 20–29)
Calcium: 9 mg/dL (ref 8.7–10.2)
Chloride: 104 mmol/L (ref 96–106)
Creatinine, Ser: 0.87 mg/dL (ref 0.57–1.00)
GFR calc Af Amer: 87 mL/min/{1.73_m2} (ref >59)
GFR calc non Af Amer: 76 mL/min/{1.73_m2} (ref >59)
Globulin, Total: 2.4 g/dL (ref 1.5–4.5)
Glucose: 84 mg/dL (ref 65–99)
Potassium: 4.2 mmol/L (ref 3.5–5.2)
Sodium: 140 mmol/L (ref 134–144)
Total Protein: 6.2 g/dL (ref 6.0–8.5)

## 2018-12-26 LAB — LIPID PANEL
Chol/HDL Ratio: 3.4 ratio (ref 0.0–4.4)
Cholesterol, Total: 195 mg/dL (ref 100–199)
HDL: 57 mg/dL (ref >39)
LDL Chol Calc (NIH): 119 mg/dL — ABNORMAL HIGH (ref 0–99)
Triglycerides: 109 mg/dL (ref 0–149)
VLDL Cholesterol Cal: 19 mg/dL (ref 5–40)

## 2018-12-31 ENCOUNTER — Telehealth: Payer: Self-pay | Admitting: *Deleted

## 2018-12-31 NOTE — Telephone Encounter (Addendum)
Prior Auth for Aimee Hart   Key: [id number] -    PA Case ID: [id number]    The request has been approved. The authorization is effective for a maximum of 1 fills from 01/01/2019 to 01/30/2019, as long as the member is enrolled in their current health plan. The request was approved as submitted. This request is approved for 70 tablets. An additional authorization has been approved for Contrave ER 8-90 mg tablet allowing 4 tablets per day effective 01/31/2019 through 05/02/2019; please reference authorization [id number] . Renewal for Contrave requires the patient has lost at least 5% of baseline body weight after 3 months of treatment at the maintenance dose (two 8/90mg  tablets twice daily). A written notification letter will follow with additional details.

## 2019-01-13 MED FILL — CONTRAVE ER 8-90 MG TABLET: 8-90 | 30 days supply | Qty: 70 | Fill #0

## 2019-01-14 DIAGNOSIS — G4733 Obstructive sleep apnea (adult) (pediatric): Secondary | ICD-10-CM | POA: Diagnosis not present

## 2019-01-21 ENCOUNTER — Encounter: Payer: Self-pay | Admitting: Physician Assistant

## 2019-01-21 ENCOUNTER — Ambulatory Visit (INDEPENDENT_AMBULATORY_CARE_PROVIDER_SITE_OTHER): Payer: 59 | Admitting: Physician Assistant

## 2019-01-21 ENCOUNTER — Other Ambulatory Visit: Payer: Self-pay

## 2019-01-21 DIAGNOSIS — F411 Generalized anxiety disorder: Secondary | ICD-10-CM

## 2019-01-21 DIAGNOSIS — F331 Major depressive disorder, recurrent, moderate: Secondary | ICD-10-CM

## 2019-01-21 DIAGNOSIS — N943 Premenstrual tension syndrome: Secondary | ICD-10-CM | POA: Diagnosis not present

## 2019-01-21 MED ORDER — BUPROPION HCL ER (SR) 150 MG PO TB12: 150.0000 mg | ORAL_TABLET | Freq: Two times a day (BID) | ORAL | 1 refills | Status: DC

## 2019-01-21 MED ORDER — ALPRAZOLAM 0.5 MG PO TABS: 0.5000 mg | ORAL_TABLET | Freq: Two times a day (BID) | ORAL | 1 refills | Status: DC | PRN

## 2019-01-21 MED ORDER — VORTIOXETINE HBR 20 MG PO TABS: 20.0000 mg | ORAL_TABLET | Freq: Every day | ORAL | 1 refills | Status: DC

## 2019-01-21 MED FILL — ALPRAZolam 0.5 MG TABS: 0.5 | 45 days supply | Qty: 90 | Fill #0

## 2019-01-21 MED FILL — TRINTELLIX 20 MG TABLET: 20 | 30 days supply | Qty: 30 | Fill #0

## 2019-01-21 MED FILL — BUPROPION HCL SR 150 MG TAB: 150 | 30 days supply | Qty: 60 | Fill #0

## 2019-01-21 NOTE — Progress Notes (Signed)
[facility]  Patient ID: Aimee Hart,  MRN: [id number]  PCP: Baruch Gouty, FNP  Date of Evaluation: 01/21/2019 Time spent:15 minutes  Chief Complaint:  Chief Complaint    Depression      HISTORY/CURRENT STATUS: HPI More depression for 2 months.  Thinks she may be going through menopause. Still having regular periods.  Gets more irritable and moody the week before her menses.   Has anhedonia, doesn't want to go out and do things. Sleeps a lot better since on CPAP now, but still likes to stay home and in bed when possible. 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  She does miss some days at work because of her mental health and the fact that she just does not want to go in.  Energy and motivation can be low at times.  She does not cry easily.  Denies suicidal or homicidal thoughts.  She does have anxiety still and will have more of a generalized anxiety than panic attacks.  Those do occur sometimes though.  The Xanax does help.  She is usually taking it maybe 4 or 5 days a week in usually only 1 a day is all she needs.  She has been having more heartburn and because of that, she has had some vomiting because of the burning in her throat.  Twice in the past few months, she has noticed that the Wellbutrin pill has not digested even after 8 to 12 hours of having taken it.  She is wondering if that could possibly be a reason that she is feeling more down, less energetic and not as motivated.    Denies dizziness, syncope, seizures, numbness, tingling, tremor, tics, unsteady gait, slurred speech, confusion. Denies muscle or joint pain, stiffness, or dystonia.  Individual Medical History/ Review of Systems: Changes? :Yes GERD  Past medications for mental health diagnoses include: Zoloft, Prozac, Paxil, Abilify, Effexor, Celexa, Cymbalta, Lamictal  Allergies: Patient has no known allergies.  Current  Medications:  Current Outpatient Medications:  .  ALPRAZolam (XANAX) 0.5 MG tablet, Take 1 tablet (0.5 mg total) by mouth 2 (two) times daily as needed for anxiety., Disp: 90 tablet, Rfl: 1 .  Calcium Carbonate-Vit D-Min (CALCIUM 1200 PO), Take 1,200 mg by mouth daily., Disp: , Rfl:  .  clindamycin-benzoyl peroxide (BENZACLIN) gel, Apply topically 2 (two) times daily., Disp: 25 g, Rfl: 0 .  ibuprofen (ADVIL,MOTRIN) 200 MG tablet, Take 600 mg by mouth every 6 (six) hours as needed for headache, mild pain or moderate pain., Disp: , Rfl:  .  Naltrexone-buPROPion HCl ER (CONTRAVE) 8-90 MG TB12, 1 tablet Po Qam x7d ; then 1 tab po BID x 7 d; then 2 tab in AM and 1 in PM X7d; Then 2 po BID from then on, Disp: 70 tablet, Rfl: 0 .  omeprazole (PRILOSEC) 10 MG capsule, Take 10 mg by mouth daily., Disp: , Rfl:  .  buPROPion (WELLBUTRIN SR) 150 MG 12 hr tablet, Take 1 tablet (150 mg total) by mouth 2 (two) times daily., Disp: 60 tablet, Rfl: 1 .  vortioxetine HBr (TRINTELLIX) 20 MG TABS tablet, Take 1 tablet (20 mg total) by mouth daily., Disp: 30 tablet, Rfl: 1 Medication Side Effects: none  Family Medical/ Social History: Changes? No  MENTAL HEALTH EXAM:  There were no vitals taken for this visit.There is no height or weight on file to calculate BMI.  General Appearance:  Casual, Neat and Well Groomed  Eye Contact:  Good  Speech:  Clear and Coherent  Volume:  Normal  Mood:  Depressed  Affect:  Appropriate  Thought Process:  Goal Directed and Descriptions of Associations: Intact  Orientation:  Full (Time, Place, and Person)  Thought Content: Logical   Suicidal Thoughts:  No  Homicidal Thoughts:  No  Memory:  WNL  Judgement:  Good  Insight:  Good  Psychomotor Activity:  Normal  Concentration:  Concentration: Good  Recall:  Good  Fund of Knowledge: Good  Language: Good  Assets:  Desire for Improvement  ADL's:  Intact  Cognition: WNL  Prognosis:  Good    DIAGNOSES:    ICD-10-CM   1.  Major depressive disorder, recurrent episode, moderate (HCC)  F33.1   2. Generalized anxiety disorder  F41.1   3. PMS (premenstrual syndrome)  N94.3     Receiving Psychotherapy: No    RECOMMENDATIONS:  PDMP was reviewed. Discussed the Wellbutrin.  I am not sure if the pill is digesting or not.  We agreed to change the formulation and see if that makes her feel any better. Change Wellbutrin to SR 150 mg 1 twice daily. Increase Trintellix to 20 mg daily. Continue Xanax 0.5 mg 1 twice daily as needed. We discussed the PMS/PMDD.  Since her menses are irregular now it would be difficult to know exactly when to add this, but we could add Zoloft 25 mg daily the week prior to menses.  She prefers not to do that right now and I agree under the circumstances.  We will keep that in the back of our minds. Return in 4 to 6 weeks.  Donnal Moat, PA-C

## 2019-02-24 ENCOUNTER — Other Ambulatory Visit: Payer: Self-pay

## 2019-02-24 ENCOUNTER — Encounter: Payer: Self-pay | Admitting: Physician Assistant

## 2019-02-24 ENCOUNTER — Ambulatory Visit (INDEPENDENT_AMBULATORY_CARE_PROVIDER_SITE_OTHER): Payer: 59 | Admitting: Physician Assistant

## 2019-02-24 DIAGNOSIS — G4700 Insomnia, unspecified: Secondary | ICD-10-CM | POA: Diagnosis not present

## 2019-02-24 DIAGNOSIS — F331 Major depressive disorder, recurrent, moderate: Secondary | ICD-10-CM

## 2019-02-24 DIAGNOSIS — F411 Generalized anxiety disorder: Secondary | ICD-10-CM

## 2019-02-24 NOTE — Progress Notes (Signed)
[facility]  Patient ID: Aimee Hart,  MRN: [id number]  PCP: Baruch Gouty, FNP  Date of Evaluation: 02/24/2019 Time spent:15 minutes  Chief Complaint:  Chief Complaint    Follow-up      HISTORY/CURRENT STATUS: HPI For routine med check.  At the last visit about 6 weeks ago, we changed the Wellbutrin formulation to see if that would help her feel better longer throughout the day.  We also increased the Trintellix.  Allan has not really noticed any difference.  But she has been sick with fever and COVID-like symptoms for about a week in between our visits.  She still feels really tired.  Her COVID test was negative.  She had to be out of work per [employer] because of her illness.  So she is thinking that the medications could be working more than she thinks due to the fact that she has been sick.  She has not been sleeping well which has become a chronic problem.  She is thinking it may be related to menopausal symptoms.  She is tried melatonin and that is not effective.  In the past she has taken Ambien and it made her too groggy and tired the next day.  States she does not really want to do anything.  She does go to work most days.  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  She is exhausted when she gets home.  On work nights, she "crashes" and sleeps about 10 hours.  Other nights however she sleeps maybe 5 or 6 and feels tired before she even starts her day.  She does not cry easily.  Denies suicidal or homicidal thoughts.  Denies dizziness, syncope, seizures, numbness, tingling, tremor, tics, unsteady gait, slurred speech, confusion. Denies muscle or joint pain, stiffness, or dystonia.  Individual Medical History/ Review of Systems: Changes? :No    Past medications for mental health diagnoses include: Zoloft, Prozac, Paxil, Abilify, Effexor, Celexa, Cymbalta, Lamictal, Ambien, melatonin  Allergies: Patient has no known allergies.  Current Medications:   Current Outpatient Medications:  .  ALPRAZolam (XANAX) 0.5 MG tablet, Take 1 tablet (0.5 mg total) by mouth 2 (two) times daily as needed for anxiety., Disp: 90 tablet, Rfl: 1 .  buPROPion (WELLBUTRIN SR) 150 MG 12 hr tablet, Take 1 tablet (150 mg total) by mouth 2 (two) times daily., Disp: 60 tablet, Rfl: 1 .  clindamycin-benzoyl peroxide (BENZACLIN) gel, Apply topically 2 (two) times daily., Disp: 25 g, Rfl: 0 .  ibuprofen (ADVIL,MOTRIN) 200 MG tablet, Take 600 mg by mouth every 6 (six) hours as needed for headache, mild pain or moderate pain., Disp: , Rfl:  .  omeprazole (PRILOSEC) 10 MG capsule, Take 10 mg by mouth daily., Disp: , Rfl:  .  vortioxetine HBr (TRINTELLIX) 20 MG TABS tablet, Take 1 tablet (20 mg total) by mouth daily., Disp: 30 tablet, Rfl: 1 .  Naltrexone-buPROPion HCl ER (CONTRAVE) 8-90 MG TB12, 1 tablet Po Qam x7d ; then 1 tab po BID x 7 d; then 2 tab in AM and 1 in PM X7d; Then 2 po BID from then on (Patient not taking: Reported on 02/24/2019), Disp: 70 tablet, Rfl: 0 Medication Side Effects: none  Family Medical/ Social History: Changes? No  MENTAL HEALTH EXAM:  There were no vitals taken for this visit.There is no height or weight on file to calculate BMI.  General Appearance: Casual, Neat, Well Groomed and Obese  Eye Contact:  Good  Speech:  Clear and Coherent  Volume:  Normal  Mood:  Euthymic  Affect:  Appropriate  Thought Process:  Goal Directed and Descriptions of Associations: Intact  Orientation:  Full (Time, Place, and Person)  Thought Content: Logical   Suicidal Thoughts:  No  Homicidal Thoughts:  No  Memory:  WNL  Judgement:  Good  Insight:  Good  Psychomotor Activity:  Normal  Concentration:  Concentration: Good  Recall:  Good  Fund of Knowledge: Good  Language: Good  Assets:  Desire for Improvement  ADL's:  Intact  Cognition: WNL  Prognosis:  Good    DIAGNOSES:    ICD-10-CM   1. Insomnia, unspecified type  G47.00   2. Major depressive  disorder, recurrent episode, moderate (HCC)  F33.1   3. Generalized anxiety disorder  F41.1     Receiving Psychotherapy: No    RECOMMENDATIONS:  Consider Trazodone.  She prefers to hold off on adding any other medications right now but will call if needed and that can be prescribed over the phone. Sleep hygiene was discussed. She prefers not changing any medications at this time, which I agree with.  Since she was sick in between our visits it is difficult to tell whether the medication changes have helped or not. Continue Xanax 0.5 mg 1 twice daily as needed. Continue Wellbutrin SR 150 mg, 1 p.o. twice daily. Continue Trintellix 20 mg daily. Return in 6 weeks.  Donnal Moat, PA-C

## 2019-03-18 DIAGNOSIS — G4733 Obstructive sleep apnea (adult) (pediatric): Secondary | ICD-10-CM | POA: Diagnosis not present

## 2019-03-24 MED FILL — TRINTELLIX 20 MG TABLET: 20 | 30 days supply | Qty: 30 | Fill #1

## 2019-03-27 ENCOUNTER — Ambulatory Visit: Payer: 59 | Admitting: Family Medicine

## 2019-04-08 ENCOUNTER — Ambulatory Visit: Payer: 59 | Admitting: Physician Assistant

## 2019-04-13 MED FILL — BUPROPION HCL SR 150 MG TAB: 150 | 30 days supply | Qty: 60 | Fill #1

## 2019-04-20 ENCOUNTER — Other Ambulatory Visit: Payer: Self-pay | Admitting: Physician Assistant

## 2019-04-22 MED FILL — TRINTELLIX 20 MG TABLET: 20 | 30 days supply | Qty: 30 | Fill #0

## 2019-05-11 ENCOUNTER — Encounter: Payer: Self-pay | Admitting: Physician Assistant

## 2019-05-11 ENCOUNTER — Other Ambulatory Visit: Payer: Self-pay

## 2019-05-11 ENCOUNTER — Ambulatory Visit (INDEPENDENT_AMBULATORY_CARE_PROVIDER_SITE_OTHER): Payer: 59 | Admitting: Physician Assistant

## 2019-05-11 DIAGNOSIS — F3341 Major depressive disorder, recurrent, in partial remission: Secondary | ICD-10-CM

## 2019-05-11 DIAGNOSIS — F411 Generalized anxiety disorder: Secondary | ICD-10-CM | POA: Diagnosis not present

## 2019-05-11 DIAGNOSIS — G4700 Insomnia, unspecified: Secondary | ICD-10-CM

## 2019-05-11 DIAGNOSIS — G4733 Obstructive sleep apnea (adult) (pediatric): Secondary | ICD-10-CM | POA: Diagnosis not present

## 2019-05-11 MED ORDER — BUPROPION HCL ER (SR) 150 MG PO TB12: 150.0000 mg | ORAL_TABLET | Freq: Two times a day (BID) | ORAL | 0 refills | Status: DC

## 2019-05-11 MED ORDER — VORTIOXETINE HBR 20 MG PO TABS: 20.0000 mg | ORAL_TABLET | Freq: Every day | ORAL | 0 refills | Status: DC

## 2019-05-11 MED ORDER — ALPRAZOLAM 0.5 MG PO TABS: 0.5000 mg | ORAL_TABLET | Freq: Two times a day (BID) | ORAL | 1 refills | Status: DC | PRN

## 2019-05-11 MED FILL — BUPROPION HCL SR 150 MG TAB: 150 | 90 days supply | Qty: 180 | Fill #0

## 2019-05-11 MED FILL — ALPRAZolam 0.5 MG TABS: 0.5 | 45 days supply | Qty: 90 | Fill #0

## 2019-05-11 NOTE — Progress Notes (Signed)
[facility]  Patient ID: Aimee Hart,  MRN: [id number]  PCP: Baruch Gouty, FNP  Date of Evaluation: 05/11/2019 Time spent:20 minutes  Chief Complaint:  Chief Complaint    Depression; Anxiety; Follow-up      HISTORY/CURRENT STATUS: HPI for routine med check.  Patient states she is doing better.  Her mood has been better and she has been able to enjoy things more.  Energy and motivation are good.  She does not cry easily.  No suicidal or homicidal thoughts.  She and her boyfriend just went on vacation to [place].  She had a great time and feels like that really helped her mental health.  Work is going okay.  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  Reports sleeping well.  Not needing to take the Xanax very often.  It does help when she needs it though.  Denies dizziness, syncope, seizures, numbness, tingling, tremor, tics, unsteady gait, slurred speech, confusion. Denies muscle or joint pain, stiffness, or dystonia.  Individual Medical History/ Review of Systems: Changes? :No    Past medications for mental health diagnoses include: Zoloft, Prozac, Paxil, Abilify, Effexor, Celexa, Cymbalta, Lamictal, Ambien, melatonin  Allergies: Patient has no known allergies.  Current Medications:  Current Outpatient Medications:  .  ALPRAZolam (XANAX) 0.5 MG tablet, Take 1 tablet (0.5 mg total) by mouth 2 (two) times daily as needed for anxiety., Disp: 90 tablet, Rfl: 1 .  buPROPion (WELLBUTRIN SR) 150 MG 12 hr tablet, Take 1 tablet (150 mg total) by mouth 2 (two) times daily., Disp: 180 tablet, Rfl: 0 .  clindamycin-benzoyl peroxide (BENZACLIN) gel, Apply topically 2 (two) times daily., Disp: 25 g, Rfl: 0 .  ibuprofen (ADVIL,MOTRIN) 200 MG tablet, Take 600 mg by mouth every 6 (six) hours as needed for headache, mild pain or moderate pain., Disp: , Rfl:  .  omeprazole (PRILOSEC) 10 MG capsule, Take 10 mg by mouth daily., Disp: ,  Rfl:  .  vortioxetine HBr (TRINTELLIX) 20 MG TABS tablet, Take 1 tablet (20 mg total) by mouth daily., Disp: 90 tablet, Rfl: 0 Medication Side Effects: none  Family Medical/ Social History: Changes? No  MENTAL HEALTH EXAM:  There were no vitals taken for this visit.There is no height or weight on file to calculate BMI.  General Appearance: Casual, Neat and Well Groomed  Eye Contact:  Good  Speech:  Clear and Coherent  Volume:  Normal  Mood:  Euthymic  Affect:  Appropriate  Thought Process:  Goal Directed and Descriptions of Associations: Intact  Orientation:  Full (Time, Place, and Person)  Thought Content: Logical   Suicidal Thoughts:  No  Homicidal Thoughts:  No  Memory:  WNL  Judgement:  Good  Insight:  Good  Psychomotor Activity:  Normal  Concentration:  Concentration: Good  Recall:  Good  Fund of Knowledge: Good  Language: Good  Assets:  Desire for Improvement  ADL's:  Intact  Cognition: WNL  Prognosis:  Good    DIAGNOSES:    ICD-10-CM   1. Recurrent major depressive disorder, in partial remission ([place])  F33.41   2. Generalized anxiety disorder  F41.1   3. Insomnia, unspecified type  G47.00   4. Obstructive sleep apnea  G47.33     Receiving Psychotherapy: No    RECOMMENDATIONS:  I am glad to see her doing better! PDMP was reviewed. Continue Xanax 0.5 mg 1 twice daily as needed. Continue Wellbutrin SR  150 mg 1 p.o. twice daily. Continue Trintellix 20 mg 1 p.o. daily. Return in 3 months.  Donnal Moat, PA-C

## 2019-05-16 MED FILL — TRINTELLIX 20 MG TABLET: 20 | 90 days supply | Qty: 90 | Fill #0

## 2019-06-01 MED FILL — TRINTELLIX 20 MG TABLET: 20 | 30 days supply | Qty: 30 | Fill #1

## 2019-06-11 ENCOUNTER — Other Ambulatory Visit: Payer: Self-pay

## 2019-06-11 MED ORDER — BUPROPION HCL ER (SR) 150 MG PO TB12: 150.0000 mg | ORAL_TABLET | Freq: Two times a day (BID) | ORAL | 0 refills | Status: DC

## 2019-06-11 MED FILL — BUPROPION HCL SR 150 MG TAB: 150 | 90 days supply | Qty: 180 | Fill #0

## 2019-06-27 MED FILL — TRINTELLIX 20 MG TABLET: 20 | 90 days supply | Qty: 90 | Fill #0

## 2019-06-29 DIAGNOSIS — G4733 Obstructive sleep apnea (adult) (pediatric): Secondary | ICD-10-CM | POA: Diagnosis not present

## 2019-08-06 ENCOUNTER — Other Ambulatory Visit (HOSPITAL_COMMUNITY): Payer: Self-pay | Admitting: Surgery

## 2019-08-06 ENCOUNTER — Other Ambulatory Visit: Payer: Self-pay | Admitting: Surgery

## 2019-08-06 DIAGNOSIS — Z9884 Bariatric surgery status: Secondary | ICD-10-CM

## 2019-08-11 ENCOUNTER — Ambulatory Visit: Payer: 59 | Admitting: Physician Assistant

## 2019-08-12 ENCOUNTER — Encounter: Payer: Self-pay | Admitting: Physician Assistant

## 2019-08-12 ENCOUNTER — Other Ambulatory Visit: Payer: Self-pay | Admitting: Physician Assistant

## 2019-08-12 ENCOUNTER — Ambulatory Visit (HOSPITAL_COMMUNITY)
Admission: RE | Admit: 2019-08-12 | Discharge: 2019-08-12 | Disposition: A | Discharge status: Home / Self Care | Payer: 59 | Source: Ambulatory Visit | Attending: Surgery | Admitting: Surgery

## 2019-08-12 ENCOUNTER — Ambulatory Visit (INDEPENDENT_AMBULATORY_CARE_PROVIDER_SITE_OTHER): Payer: 59 | Admitting: Physician Assistant

## 2019-08-12 ENCOUNTER — Other Ambulatory Visit (HOSPITAL_COMMUNITY): Payer: Self-pay | Admitting: Surgery

## 2019-08-12 ENCOUNTER — Other Ambulatory Visit: Payer: Self-pay

## 2019-08-12 DIAGNOSIS — Z9884 Bariatric surgery status: Secondary | ICD-10-CM | POA: Insufficient documentation

## 2019-08-12 DIAGNOSIS — K219 Gastro-esophageal reflux disease without esophagitis: Secondary | ICD-10-CM | POA: Diagnosis not present

## 2019-08-12 DIAGNOSIS — F411 Generalized anxiety disorder: Secondary | ICD-10-CM | POA: Diagnosis not present

## 2019-08-12 DIAGNOSIS — F3341 Major depressive disorder, recurrent, in partial remission: Secondary | ICD-10-CM

## 2019-08-12 MED ORDER — VORTIOXETINE HBR 20 MG PO TABS: 20.0000 mg | ORAL_TABLET | Freq: Every day | ORAL | 1 refills | Status: DC

## 2019-08-12 MED ORDER — ALPRAZOLAM 0.5 MG PO TABS: 0.5000 mg | ORAL_TABLET | Freq: Two times a day (BID) | ORAL | 1 refills | Status: DC | PRN

## 2019-08-12 MED ORDER — BUPROPION HCL ER (SR) 150 MG PO TB12: 150.0000 mg | ORAL_TABLET | Freq: Two times a day (BID) | ORAL | 1 refills | Status: DC

## 2019-08-12 MED FILL — ALPRAZolam 0.5 MG TABS: 0.5 | 45 days supply | Qty: 90 | Fill #0

## 2019-08-12 NOTE — Progress Notes (Signed)
[facility]  Patient ID: Aimee Hart,  MRN: [id number]  PCP: Baruch Gouty, FNP  Date of Evaluation: 08/12/2019 Time spent:20 minutes  Chief Complaint:  Chief Complaint    Depression; Anxiety      HISTORY/CURRENT STATUS: HPI For routine med check.  Aimee Hart states she is doing very well.  She sleeps well most of the time.  Work is going okay.  She is an Therapist, sports at [employer].  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  It is very tiring but going well.  Patient denies loss of interest in usual activities and is able to enjoy things.  Denies decreased energy or motivation.  Appetite has not changed.  No extreme sadness, tearfulness, or feelings of hopelessness.  Denies any changes in concentration, making decisions or remembering things.  Denies suicidal or homicidal thoughts.  She still gets anxious at times but not like it used to be.  Not really having panic attacks now.  The Xanax still helps when needed.  Denies dizziness, syncope, seizures, numbness, tingling, tremor, tics, unsteady gait, slurred speech, confusion. Denies muscle or joint pain, stiffness, or dystonia.  Individual Medical History/ Review of Systems: Changes? :Yes Increased GERD, had Barium swallow and awaiting results.   Past medications for mental health diagnoses include: Zoloft, Prozac, Paxil, Abilify, Effexor, Celexa, Cymbalta, Lamictal, Ambien,melatonin  Allergies: Patient has no known allergies.  Current Medications:  Current Outpatient Medications:  .  ALPRAZolam (XANAX) 0.5 MG tablet, Take 1 tablet (0.5 mg total) by mouth 2 (two) times daily as needed for anxiety., Disp: 90 tablet, Rfl: 1 .  buPROPion (WELLBUTRIN SR) 150 MG 12 hr tablet, Take 1 tablet (150 mg total) by mouth 2 (two) times daily., Disp: 180 tablet, Rfl: 1 .  clindamycin-benzoyl peroxide (BENZACLIN) gel, Apply topically 2 (two) times daily., Disp: 25 g, Rfl: 0 .  ibuprofen (ADVIL,MOTRIN) 200 MG  tablet, Take 600 mg by mouth every 6 (six) hours as needed for headache, mild pain or moderate pain., Disp: , Rfl:  .  omeprazole (PRILOSEC) 10 MG capsule, Take 10 mg by mouth daily., Disp: , Rfl:  .  vortioxetine HBr (TRINTELLIX) 20 MG TABS tablet, Take 1 tablet (20 mg total) by mouth daily., Disp: 90 tablet, Rfl: 1 Medication Side Effects: none  Family Medical/ Social History: Changes? No  MENTAL HEALTH EXAM:  There were no vitals taken for this visit.There is no height or weight on file to calculate BMI.  General Appearance: Casual, Neat and Well Groomed  Eye Contact:  Good  Speech:  Clear and Coherent and Normal Rate  Volume:  Normal  Mood:  Euthymic  Affect:  Appropriate  Thought Process:  Goal Directed and Descriptions of Associations: Intact  Orientation:  Full (Time, Place, and Person)  Thought Content: Logical   Suicidal Thoughts:  No  Homicidal Thoughts:  No  Memory:  WNL  Judgement:  Good  Insight:  Good  Psychomotor Activity:  Normal  Concentration:  Concentration: Good  Recall:  Good  Fund of Knowledge: Good  Language: Good  Assets:  Desire for Improvement  ADL's:  Intact  Cognition: WNL  Prognosis:  Good    DIAGNOSES:    ICD-10-CM   1. Recurrent major depressive disorder, in partial remission ([place])  F33.41   2. Generalized anxiety disorder  F41.1     Receiving Psychotherapy: No    RECOMMENDATIONS:  PDMP was reviewed. I spent 20 minutes with her. I am glad  to see her doing so well! Continue Trintellix 20 mg 1 p.o. daily. Continue Wellbutrin SR 150 mg, 1 p.o. twice daily. Continue Xanax 0.5 mg, 1 p.o. twice daily as needed. Return in 6 months.  Donnal Moat, PA-C

## 2019-08-14 DIAGNOSIS — K9509 Other complications of gastric band procedure: Secondary | ICD-10-CM | POA: Diagnosis not present

## 2019-10-01 DIAGNOSIS — G4733 Obstructive sleep apnea (adult) (pediatric): Secondary | ICD-10-CM | POA: Diagnosis not present

## 2019-10-13 MED FILL — TRINTELLIX 20 MG TABLET: 20 | 90 days supply | Qty: 90 | Fill #0

## 2019-11-05 DIAGNOSIS — Z1231 Encounter for screening mammogram for malignant neoplasm of breast: Secondary | ICD-10-CM | POA: Diagnosis not present

## 2019-12-17 DIAGNOSIS — Z6831 Body mass index (BMI) 31.0-31.9, adult: Secondary | ICD-10-CM | POA: Diagnosis not present

## 2019-12-17 DIAGNOSIS — Z113 Encounter for screening for infections with a predominantly sexual mode of transmission: Secondary | ICD-10-CM | POA: Diagnosis not present

## 2019-12-17 DIAGNOSIS — Z13220 Encounter for screening for lipoid disorders: Secondary | ICD-10-CM | POA: Diagnosis not present

## 2019-12-17 DIAGNOSIS — Z1321 Encounter for screening for nutritional disorder: Secondary | ICD-10-CM | POA: Diagnosis not present

## 2019-12-17 DIAGNOSIS — Z13228 Encounter for screening for other metabolic disorders: Secondary | ICD-10-CM | POA: Diagnosis not present

## 2019-12-17 DIAGNOSIS — Z1329 Encounter for screening for other suspected endocrine disorder: Secondary | ICD-10-CM | POA: Diagnosis not present

## 2019-12-17 DIAGNOSIS — Z01419 Encounter for gynecological examination (general) (routine) without abnormal findings: Secondary | ICD-10-CM | POA: Diagnosis not present

## 2019-12-23 ENCOUNTER — Other Ambulatory Visit: Payer: Self-pay

## 2019-12-23 MED ORDER — BUPROPION HCL ER (SR) 150 MG PO TB12: 150.0000 mg | ORAL_TABLET | Freq: Two times a day (BID) | ORAL | 0 refills | Status: DC

## 2019-12-23 MED FILL — BUPROPION HCL SR 150 MG TAB: 150 | 90 days supply | Qty: 180 | Fill #0

## 2020-01-16 DIAGNOSIS — G4733 Obstructive sleep apnea (adult) (pediatric): Secondary | ICD-10-CM | POA: Diagnosis not present

## 2020-01-18 MED FILL — TRINTELLIX 20 MG TABLET: 20 | 90 days supply | Qty: 90 | Fill #1

## 2020-02-11 ENCOUNTER — Ambulatory Visit: Payer: 59 | Admitting: Physician Assistant

## 2020-02-17 ENCOUNTER — Ambulatory Visit (INDEPENDENT_AMBULATORY_CARE_PROVIDER_SITE_OTHER): Payer: 59 | Admitting: Physician Assistant

## 2020-02-17 ENCOUNTER — Encounter: Payer: Self-pay | Admitting: Physician Assistant

## 2020-02-17 ENCOUNTER — Other Ambulatory Visit: Payer: Self-pay

## 2020-02-17 ENCOUNTER — Other Ambulatory Visit: Payer: Self-pay | Admitting: Physician Assistant

## 2020-02-17 DIAGNOSIS — F3341 Major depressive disorder, recurrent, in partial remission: Secondary | ICD-10-CM | POA: Diagnosis not present

## 2020-02-17 DIAGNOSIS — F411 Generalized anxiety disorder: Secondary | ICD-10-CM

## 2020-02-17 MED ORDER — BUPROPION HCL ER (SR) 150 MG PO TB12: 150.0000 mg | ORAL_TABLET | Freq: Two times a day (BID) | ORAL | 1 refills | Status: DC

## 2020-02-17 MED ORDER — ALPRAZOLAM 0.5 MG PO TABS: 0.5000 mg | ORAL_TABLET | Freq: Two times a day (BID) | ORAL | 2 refills | Status: DC | PRN

## 2020-02-17 MED ORDER — VORTIOXETINE HBR 20 MG PO TABS: 20.0000 mg | ORAL_TABLET | Freq: Every day | ORAL | 1 refills | Status: DC

## 2020-02-17 MED FILL — ALPRAZolam 0.5 MG TABS: 0.5 | 45 days supply | Qty: 90 | Fill #0

## 2020-02-17 NOTE — Progress Notes (Signed)
[facility]  Patient ID: BRITT THEARD,  MRN: [id number]  PCP: No primary care provider on file.  Date of Evaluation: 02/17/2020 Time spent:20 minutes  Chief Complaint:  Chief Complaint    Anxiety; Depression      HISTORY/CURRENT STATUS: HPI For routine med check.  Maryln states she is doing very well.  She sleeps well most of the time.  Work is going okay.  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  Patient denies loss of interest in usual activities and is able to enjoy things.  Denies decreased energy or motivation.  Appetite has not changed.  No extreme sadness, tearfulness, or feelings of hopelessness.  Denies any changes in concentration, making decisions or remembering things.  Denies suicidal or homicidal thoughts.  She still gets anxious at times but not like it used to be.  Not really having panic attacks now.  The Xanax still helps when needed.  Denies dizziness, syncope, seizures, numbness, tingling, tremor, tics, unsteady gait, slurred speech, confusion. Denies muscle or joint pain, stiffness, or dystonia.  Individual Medical History/ Review of Systems: Changes? :No    Past medications for mental health diagnoses include: Zoloft, Prozac, Paxil, Abilify, Effexor, Celexa, Cymbalta, Lamictal, Ambien,melatonin  Allergies: Patient has no known allergies.  Current Medications:  Current Outpatient Medications:  .  ALPRAZolam (XANAX) 0.5 MG tablet, Take 1 tablet (0.5 mg total) by mouth 2 (two) times daily as needed for anxiety., Disp: 90 tablet, Rfl: 2 .  buPROPion (WELLBUTRIN SR) 150 MG 12 hr tablet, Take 1 tablet (150 mg total) by mouth 2 (two) times daily., Disp: 180 tablet, Rfl: 1 .  ibuprofen (ADVIL,MOTRIN) 200 MG tablet, Take 600 mg by mouth every 6 (six) hours as needed for headache, mild pain or moderate pain., Disp: , Rfl:  .  omeprazole (PRILOSEC) 10 MG capsule, Take 10 mg by mouth daily., Disp: , Rfl:  .  vortioxetine HBr  (TRINTELLIX) 20 MG TABS tablet, Take 1 tablet (20 mg total) by mouth daily., Disp: 90 tablet, Rfl: 1 .  clindamycin-benzoyl peroxide (BENZACLIN) gel, Apply topically 2 (two) times daily. (Patient not taking: Reported on 02/17/2020), Disp: 25 g, Rfl: 0 Medication Side Effects: none  Family Medical/ Social History: Changes?  Her boyfriend has moved in with her.  Her son is getting married Saturday.  MENTAL HEALTH EXAM:  There were no vitals taken for this visit.There is no height or weight on file to calculate BMI.  General Appearance: Casual, Neat and Well Groomed  Eye Contact:  Good  Speech:  Clear and Coherent and Normal Rate  Volume:  Normal  Mood:  Euthymic  Affect:  Appropriate  Thought Process:  Goal Directed and Descriptions of Associations: Intact  Orientation:  Full (Time, Place, and Person)  Thought Content: Logical   Suicidal Thoughts:  No  Homicidal Thoughts:  No  Memory:  WNL  Judgement:  Good  Insight:  Good  Psychomotor Activity:  Normal  Concentration:  Concentration: Good  Recall:  Good  Fund of Knowledge: Good  Language: Good  Assets:  Desire for Improvement  ADL's:  Intact  Cognition: WNL  Prognosis:  Good    DIAGNOSES:    ICD-10-CM   1. Recurrent major depressive disorder, in partial remission ([place])  F33.41   2. Generalized anxiety disorder  F41.1     Receiving Psychotherapy: No    RECOMMENDATIONS:  PDMP was reviewed. I spent 20 minutes with her. I am glad to see her doing  so well! Continue Trintellix 20 mg 1 p.o. daily. Continue Wellbutrin SR 150 mg, 1 p.o. twice daily. Continue Xanax 0.5 mg, 1 p.o. twice daily as needed. Return in 6 months.  Donnal Moat, PA-C

## 2020-04-06 DIAGNOSIS — H5213 Myopia, bilateral: Secondary | ICD-10-CM | POA: Diagnosis not present

## 2020-04-21 MED FILL — TRINTELLIX 20 MG TABLET: 20 | 90 days supply | Qty: 90 | Fill #0

## 2020-05-13 DIAGNOSIS — G4733 Obstructive sleep apnea (adult) (pediatric): Secondary | ICD-10-CM | POA: Diagnosis not present

## 2020-05-18 DIAGNOSIS — S338XXA Sprain of other parts of lumbar spine and pelvis, initial encounter: Secondary | ICD-10-CM | POA: Diagnosis not present

## 2020-05-18 DIAGNOSIS — S233XXA Sprain of ligaments of thoracic spine, initial encounter: Secondary | ICD-10-CM | POA: Diagnosis not present

## 2020-05-19 DIAGNOSIS — S338XXA Sprain of other parts of lumbar spine and pelvis, initial encounter: Secondary | ICD-10-CM | POA: Diagnosis not present

## 2020-05-19 DIAGNOSIS — S233XXA Sprain of ligaments of thoracic spine, initial encounter: Secondary | ICD-10-CM | POA: Diagnosis not present

## 2020-06-29 MED FILL — BUPROPION HCL SR 150 MG TAB: 150 | 90 days supply | Qty: 180 | Fill #1

## 2020-07-20 DIAGNOSIS — K219 Gastro-esophageal reflux disease without esophagitis: Secondary | ICD-10-CM | POA: Diagnosis not present

## 2020-07-20 DIAGNOSIS — K9509 Other complications of gastric band procedure: Secondary | ICD-10-CM | POA: Diagnosis not present

## 2020-07-25 ENCOUNTER — Other Ambulatory Visit (HOSPITAL_COMMUNITY): Payer: Self-pay

## 2020-07-26 ENCOUNTER — Other Ambulatory Visit (HOSPITAL_COMMUNITY): Payer: Self-pay

## 2020-07-26 MED FILL — Vortioxetine HBr Tab 20 MG (Base Equiv): ORAL | 90 days supply | Qty: 90 | Fill #0 | Status: AC

## 2020-10-11 ENCOUNTER — Other Ambulatory Visit: Payer: Self-pay

## 2020-10-11 ENCOUNTER — Ambulatory Visit (INDEPENDENT_AMBULATORY_CARE_PROVIDER_SITE_OTHER): Payer: 59 | Admitting: Physician Assistant

## 2020-10-11 ENCOUNTER — Other Ambulatory Visit (HOSPITAL_COMMUNITY): Payer: Self-pay

## 2020-10-11 ENCOUNTER — Encounter: Payer: Self-pay | Admitting: Physician Assistant

## 2020-10-11 DIAGNOSIS — F331 Major depressive disorder, recurrent, moderate: Secondary | ICD-10-CM

## 2020-10-11 DIAGNOSIS — G4700 Insomnia, unspecified: Secondary | ICD-10-CM

## 2020-10-11 DIAGNOSIS — F411 Generalized anxiety disorder: Secondary | ICD-10-CM | POA: Diagnosis not present

## 2020-10-11 DIAGNOSIS — F4321 Adjustment disorder with depressed mood: Secondary | ICD-10-CM

## 2020-10-11 MED ORDER — ZOLPIDEM TARTRATE 10 MG PO TABS
5.0000 mg | ORAL_TABLET | Freq: Every evening | ORAL | 1 refills | Status: DC | PRN
  Filled 2020-10-11: qty 30, 30d supply, fill #0

## 2020-10-11 MED ORDER — VORTIOXETINE HBR 10 MG PO TABS
10.0000 mg | ORAL_TABLET | Freq: Every day | ORAL | 1 refills | Status: DC
  Filled 2020-10-11: qty 30, 30d supply, fill #0

## 2020-10-11 MED ORDER — VORTIOXETINE HBR 20 MG PO TABS
ORAL_TABLET | Freq: Every day | ORAL | 1 refills | Status: DC
  Filled 2020-10-11: qty 90, 90d supply, fill #0
  Filled 2021-01-31: qty 90, 90d supply, fill #1

## 2020-10-11 NOTE — Progress Notes (Signed)
[facility]  Patient ID: Aimee Hart,  MRN: [id number]  PCP: No primary care provider on file.  Date of Evaluation: 10/11/2020 Time spent:30 minutes  Chief Complaint:  Chief Complaint   Depression      HISTORY/CURRENT STATUS: HPI For routine med check.  Pretty depressed. (See social hx) Crying easily , lacks energy and motivation. Makes herself go to work, but hates it. Will change to weekend nights starting in July. Looking forward to that. Trouble sleeping. Melatonin helps her go to sleep but not stay asleep. Only gets about 6-7 hours broken up sleep. Never feels rested. She cut back on the Wellbutrin d/t reslessness of legs. It helped. Depression was going on before she dropped the dose.  She does get mad easily. No anxiety usually but does need the Xanax sometimes.  It is more of a generalized sense of unease, no panic attacks for the most part.  No SI/HI.   Patient denies increased energy with decreased need for sleep, no increased talkativeness, no racing thoughts, no impulsivity or risky behaviors, no increased spending, no increased libido, no grandiosity, no increased irritability or anger, no paranoia, and no hallucinations.  Denies dizziness, syncope, seizures, numbness, tingling, tremor, tics, unsteady gait, slurred speech, confusion. Denies muscle or joint pain, stiffness, or dystonia.  Individual Medical History/ Review of Systems: Changes? :No    Past medications for mental health diagnoses include: Zoloft, Prozac, Paxil, Abilify, Effexor, Celexa, Cymbalta, Lamictal, Ambien, melatonin, Wellbutrin SR, Trintellix  Allergies: Patient has no known allergies.  Current Medications:  Current Outpatient Medications:    buPROPion (WELLBUTRIN SR) 150 MG 12 hr tablet, Take 1 tablet (150 mg total) by mouth 2 (two) times daily. (Patient taking differently: Take 150 mg by mouth daily.), Disp: 180 tablet, Rfl: 1   ibuprofen (ADVIL,MOTRIN) 200 MG tablet, Take 600  mg by mouth every 6 (six) hours as needed for headache, mild pain or moderate pain., Disp: , Rfl:    omeprazole (PRILOSEC) 10 MG capsule, Take 10 mg by mouth daily., Disp: , Rfl:    vortioxetine HBr (TRINTELLIX) 10 MG TABS tablet, Take 1 tablet (10 mg total) by mouth daily., Disp: 30 tablet, Rfl: 1   vortioxetine HBr (TRINTELLIX) 20 MG TABS tablet, TAKE 1 TABLET BY MOUTH DAILY, Disp: 90 tablet, Rfl: 1   zolpidem (AMBIEN) 10 MG tablet, Take 0.5-1 tablets (5-10 mg total) by mouth at bedtime as needed for sleep., Disp: 30 tablet, Rfl: 1   clindamycin-benzoyl peroxide (BENZACLIN) gel, Apply topically 2 (two) times daily. (Patient not taking: No sig reported), Disp: 25 g, Rfl: 0 Medication Side Effects: none  Family Medical/ Social History: Changes?  Dad died in Jul 11, 2022, had alzheimers and aspiration pneumonia.  Mom died last 11-Jan-2023.  Patient recently broke up with boyfriend of 2 years.  Her son got married since the last visit.  That was a positive stressor, very happy for him and his wife.  MENTAL HEALTH EXAM:  There were no vitals taken for this visit.There is no height or weight on file to calculate BMI.  General Appearance: Casual, Neat and Well Groomed  Eye Contact:  Good  Speech:  Clear and Coherent and Normal Rate  Volume:  Normal  Mood:  Depressed and Hopeless  Affect:  Depressed  Thought Process:  Goal Directed and Descriptions of Associations: Intact  Orientation:  Full (Time, Place, and Person)  Thought Content: Logical   Suicidal Thoughts:  No  Homicidal Thoughts:  No  Memory:  WNL  Judgement:  Good  Insight:  Good  Psychomotor Activity:  Normal  Concentration:  Concentration: Good and Attention Span: Good  Recall:  Good  Fund of Knowledge: Good  Language: Good  Assets:  Desire for Improvement  ADL's:  Intact  Cognition: WNL  Prognosis:  Good    DIAGNOSES:    ICD-10-CM   1. Major depressive disorder, recurrent episode, moderate ([facility])  F33.1     2. Insomnia,  unspecified type  G47.00     3. Generalized anxiety disorder  F41.1     4. Grief  F43.21        Receiving Psychotherapy: No    RECOMMENDATIONS:  PDMP was reviewed. I provided 30 minutes of face to face time during this encounter, including time spent before and after the visit in records review, medical decision making, and charting.  Discussed case with Dr. Clovis Pu.  Patient has responded very well to the Trintellix and has been on that same dose for several years.  Will increase to a total of 30 mg daily, even though higher than usual recommended dose, higher doses are required for some people, with close monitoring. Plan to wean off of Wellbutrin at the next visit as it does not seem to be helping at all.  I do not want to make too many changes at once. Restart Ambien 10 mg, 1/2-1 nightly as needed sleep. Increase Trintellix to a total of 30 mg daily. Continue Wellbutrin SR 150 mg, 1 p.o. every morning. Continue Xanax 0.5 mg, 1 p.o. twice daily as needed. Recommend counseling. Return in 4 weeks.  Donnal Moat, PA-C

## 2020-10-12 ENCOUNTER — Other Ambulatory Visit (HOSPITAL_COMMUNITY): Payer: Self-pay

## 2020-11-10 ENCOUNTER — Other Ambulatory Visit (HOSPITAL_COMMUNITY): Payer: Self-pay

## 2020-11-10 ENCOUNTER — Ambulatory Visit: Payer: 59 | Admitting: Physician Assistant

## 2020-11-10 ENCOUNTER — Other Ambulatory Visit: Payer: Self-pay

## 2020-11-10 ENCOUNTER — Encounter: Payer: Self-pay | Admitting: Physician Assistant

## 2020-11-10 ENCOUNTER — Ambulatory Visit (INDEPENDENT_AMBULATORY_CARE_PROVIDER_SITE_OTHER): Payer: 59 | Admitting: Physician Assistant

## 2020-11-10 DIAGNOSIS — F411 Generalized anxiety disorder: Secondary | ICD-10-CM

## 2020-11-10 DIAGNOSIS — F339 Major depressive disorder, recurrent, unspecified: Secondary | ICD-10-CM | POA: Diagnosis not present

## 2020-11-10 DIAGNOSIS — G4700 Insomnia, unspecified: Secondary | ICD-10-CM | POA: Diagnosis not present

## 2020-11-10 MED ORDER — ALPRAZOLAM 0.5 MG PO TABS
0.5000 mg | ORAL_TABLET | Freq: Two times a day (BID) | ORAL | 0 refills | Status: DC | PRN
  Filled 2020-11-10: qty 30, 15d supply, fill #0

## 2020-11-10 NOTE — Progress Notes (Signed)
[facility]  Patient ID: RANDAL YEPIZ,  MRN: [id number]  PCP: No primary care provider on file.  Date of Evaluation: 11/10/2020 Time spent:40 minutes  Chief Complaint:  Chief Complaint   Depression; Anxiety; Insomnia; Follow-up     HISTORY/CURRENT STATUS: HPI For routine med check.  We increased the Trintellix 1 month ago.  It has not helped at all.  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  She thought changing her hours would improve her mood but it has not.  No change in focus and is able to get things done in a timely manner but procrastinates with things at home.  Appetite is normal.  Weight is stable.  Does not cry easily.  No suicidal or homicidal thoughts.  At the last visit we discussed weaning off of the Wellbutrin.  She has already decreased it and has been taking 1 pill every other day for a week or so.  No withdrawals.  Does continue to have anxiety but does not often take the Xanax.  It is a nice safety net, knowing it is there though.  Trouble sleeping unless she takes Ambien because of shift work.  Denies dizziness, syncope, seizures, numbness, tingling, tremor, tics, unsteady gait, slurred speech, confusion. Denies muscle or joint pain, stiffness, or dystonia.  Individual Medical History/ Review of Systems: Changes? :Yes  had covid a few weeks ago.  Recovered fine.    Past medications for mental health diagnoses include: Zoloft, Prozac, Paxil, Abilify, Effexor, Celexa, Cymbalta, Lamictal, Ambien, melatonin, Wellbutrin SR, Trintellix  Allergies: Patient has no known allergies.  Current Medications:  Current Outpatient Medications:    ALPRAZolam (XANAX) 0.5 MG tablet, Take 1 tablet (0.5 mg total) by mouth 2 (two) times daily as needed for anxiety., Disp: 30 tablet, Rfl: 0   ibuprofen (ADVIL,MOTRIN) 200 MG tablet,  Take 600 mg by mouth every 6 (six) hours as needed for headache, mild pain or moderate pain., Disp: , Rfl:    omeprazole (PRILOSEC) 10 MG capsule, Take 10 mg by mouth daily., Disp: , Rfl:    vortioxetine HBr (TRINTELLIX) 20 MG TABS tablet, TAKE 1 TABLET BY MOUTH DAILY (Patient taking differently: Take 30 mg by mouth daily.), Disp: 90 tablet, Rfl: 1   zolpidem (AMBIEN) 10 MG tablet, Take 0.5-1 tablets (5-10 mg total) by mouth at bedtime as needed for sleep., Disp: 30 tablet, Rfl: 1   clindamycin-benzoyl peroxide (BENZACLIN) gel, Apply topically 2 (two) times daily. (Patient not taking: No sig reported), Disp: 25 g, Rfl: 0   vortioxetine HBr (TRINTELLIX) 10 MG TABS tablet, Take 1 tablet (10 mg total) by mouth daily., Disp: 30 tablet, Rfl: 1 Medication Side Effects: none  Family Medical/ Social History: Changes?  Changed her hours to nights, 2, 12 hour shifts.   MENTAL HEALTH EXAM:  There were no vitals taken for this visit.There is no height or weight on file to calculate BMI.  General Appearance: Casual, Neat and Well Groomed  Eye Contact:  Good  Speech:  Clear and Coherent and Normal Rate  Volume:  Normal  Mood:  Depressed and Hopeless  Affect:  Depressed  Thought Process:  Goal Directed and Descriptions of Associations: Intact  Orientation:  Full (Time, Place, and Person)  Thought Content: Logical   Suicidal Thoughts:  No  Homicidal Thoughts:  No  Memory:  WNL  Judgement:  Good  Insight:  Good  Psychomotor Activity:  Normal  Concentration:  Concentration: Good and Attention Span: Good  Recall:  Good  Fund of Knowledge: Good  Language: Good  Assets:  Desire for Improvement Financial Resources/Insurance Housing Resilience Transportation Vocational/Educational  ADL's:  Intact  Cognition: WNL  Prognosis:  Good    DIAGNOSES:    ICD-10-CM   1. 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 Insomnia, unspecified type  G47.00     3. Generalized  anxiety disorder  F41.1       Receiving Psychotherapy: No    RECOMMENDATIONS:  PDMP was reviewed.  Ambien filled 10/11/2020.  Xanax filled 02/17/2020. I provided 40 minutes of face to face time during this encounter, including time spent before and after the visit in records review, medical decision making, and charting.  We discussed the current treatment and in efficacy.  She has tried numerous other antidepressants without much improvement.  Recommend Gene site testing and she would like to pursue this.  Sample was obtained today. We agreed to make no medication changes except for her to stop the Wellbutrin, until we get the Gene site test results back. Continue Ambien 10 mg, 1/2-1 nightly as needed sleep. Continue Trintellix 30 mg daily. D/C Wellbutrin.  Continue Xanax 0.5 mg, 1 p.o. twice daily as needed. She takes very sparingly.  Recommend counseling. Return in 6-8 weeks.  Donnal Moat, PA-C

## 2020-11-18 ENCOUNTER — Other Ambulatory Visit (HOSPITAL_COMMUNITY): Payer: Self-pay

## 2020-12-28 DIAGNOSIS — Z01419 Encounter for gynecological examination (general) (routine) without abnormal findings: Secondary | ICD-10-CM | POA: Diagnosis not present

## 2020-12-28 DIAGNOSIS — Z1231 Encounter for screening mammogram for malignant neoplasm of breast: Secondary | ICD-10-CM | POA: Diagnosis not present

## 2020-12-28 DIAGNOSIS — Z6833 Body mass index (BMI) 33.0-33.9, adult: Secondary | ICD-10-CM | POA: Diagnosis not present

## 2020-12-29 ENCOUNTER — Encounter: Payer: Self-pay | Admitting: Family Medicine

## 2020-12-29 ENCOUNTER — Other Ambulatory Visit: Payer: Self-pay

## 2020-12-29 ENCOUNTER — Ambulatory Visit: Payer: 59 | Admitting: Family Medicine

## 2020-12-29 VITALS — BP 98/59 | HR 70 | Temp 98.2°F | Ht 65.5 in | Wt 204.1 lb

## 2020-12-29 DIAGNOSIS — R0989 Other specified symptoms and signs involving the circulatory and respiratory systems: Secondary | ICD-10-CM | POA: Diagnosis not present

## 2020-12-29 NOTE — Progress Notes (Signed)
Acute Office Visit  Subjective:    Patient ID: Aimee Hart, female    DOB: 02-19-1966, 55 y.o.   MRN: [id number]  Chief Complaint  Patient presents with   Ear Pain    HPI Patient is in today for left ear pain x 1 day. The area below her ear is tender. Denies drainage. She denies fever, congestion, cough, HA, or sore throat. She has taken some tylenol with some improvement.   Past Medical History:  Diagnosis Date   Depression    GERD (gastroesophageal reflux disease)    Hypercholesteremia    Sleep apnea     Past Surgical History:  Procedure Laterality Date   LAPAROSCOPIC ROUX-EN-Y GASTRIC BYPASS WITH UPPER ENDOSCOPY AND REMOVAL OF LAP BAND  2008   TONSILLECTOMY  1980    Family History  Problem Relation Age of Onset   Melanoma Mother    Hypertension Mother    Depression Mother    Thyroid disease Mother    Hypertension Father    Heart disease Father    Thyroid disease Father    Cancer Sister        bladder   Post-traumatic stress disorder Brother    Kidney disease Daughter    Cancer Maternal Grandmother        lung   Emphysema Maternal Grandfather    Alzheimer's disease Paternal Grandmother    Heart disease Paternal Grandfather     Social History   Socioeconomic History   Marital status: Divorced    Spouse name: Not on file   Number of children: 2   Years of education: college   Highest education level: Not on file  Occupational History    Comment: RN  Tobacco Use   Smoking status: Former    Packs/day: 1.00    Years: 10.00    Pack years: 10.00    Types: Cigarettes    Quit date: 02/07/1996    Years since quitting: 24.9   Smokeless tobacco: Never  Vaping Use   Vaping Use: Never used  Substance and Sexual Activity   Alcohol use: Yes    Comment: 1/week   Drug use: Never   Sexual activity: Yes  Other Topics Concern   Not on file  Social History Narrative   Lives with son   Caffeine 16 oz daily   Social Determinants of Health   Financial  Resource Strain: Not on file  Food Insecurity: Not on file  Transportation Needs: Not on file  Physical Activity: Not on file  Stress: Not on file  Social Connections: Not on file  Intimate Partner Violence: Not on file    Outpatient Medications Prior to Visit  Medication Sig Dispense Refill   ALPRAZolam (XANAX) 0.5 MG tablet Take 1 tablet (0.5 mg total) by mouth 2 (two) times daily as needed for anxiety. 30 tablet 0   ibuprofen (ADVIL,MOTRIN) 200 MG tablet Take 600 mg by mouth every 6 (six) hours as needed for headache, mild pain or moderate pain.     omeprazole (PRILOSEC) 10 MG capsule Take 10 mg by mouth daily.     vortioxetine HBr (TRINTELLIX) 10 MG TABS tablet Take 1 tablet (10 mg total) by mouth daily. 30 tablet 1   vortioxetine HBr (TRINTELLIX) 20 MG TABS tablet TAKE 1 TABLET BY MOUTH DAILY (Patient taking differently: Take 30 mg by mouth daily.) 90 tablet 1   zolpidem (AMBIEN) 10 MG tablet Take 0.5-1 tablets (5-10 mg total) by mouth at bedtime as needed for sleep.  30 tablet 1   clindamycin-benzoyl peroxide (BENZACLIN) gel Apply topically 2 (two) times daily. (Patient not taking: No sig reported) 25 g 0   No facility-administered medications prior to visit.    No Known Allergies  Review of Systems As per HPI.     Objective:    Physical Exam Vitals and nursing note reviewed.  Constitutional:      General: She is not in acute distress.    Appearance: She is not ill-appearing, toxic-appearing or diaphoretic.  HENT:     Head: Normocephalic and atraumatic.     Right Ear: Tympanic membrane, ear canal and external ear normal.     Left Ear: Tympanic membrane, ear canal and external ear normal.     Nose: Nose normal.     Mouth/Throat:     Mouth: Mucous membranes are moist.     Tongue: No lesions.     Palate: No mass and lesions.     Pharynx: Oropharynx is clear. No pharyngeal swelling, oropharyngeal exudate, posterior oropharyngeal erythema or uvula swelling.     Tonsils: No  tonsillar exudate or tonsillar abscesses. 0 on the right. 0 on the left.  Eyes:     Conjunctiva/sclera: Conjunctivae normal.  Neck:     Comments: Slight tenderness to left tonsillar lymph node. No erythema or enlargement.  Pulmonary:     Effort: Pulmonary effort is normal. No respiratory distress.  Skin:    General: Skin is warm and dry.  Neurological:     Mental Status: She is alert and oriented to person, place, and time.  Psychiatric:        Mood and Affect: Mood normal.        Behavior: Behavior normal.    BP (!) 98/59   Pulse 70   Temp 98.2 F (36.8 C) (Temporal)   Ht 5' 5.5" (1.664 m)   Wt 204 lb 2 oz (92.6 kg)   BMI 33.45 kg/m  Wt Readings from Last 3 Encounters:  12/29/20 204 lb 2 oz (92.6 kg)  12/25/18 207 lb (93.9 kg)  06/26/18 196 lb (88.9 kg)    Health Maintenance Due  Topic Date Due   COVID-19 Vaccine (1) Never done   Hepatitis C Screening  Never done   Zoster Vaccines- Shingrix (1 of 2) Never done   PAP SMEAR-Modifier  06/06/2020   INFLUENZA VACCINE  11/21/2020    There are no preventive care reminders to display for this patient.   Lab Results  Component Value Date   TSH 1.450 05/14/2018   Lab Results  Component Value Date   WBC 5.5 05/14/2018   HGB 12.9 05/14/2018   HCT 37.7 05/14/2018   MCV 83 05/14/2018   PLT 344 05/14/2018   Lab Results  Component Value Date   NA 140 12/25/2018   K 4.2 12/25/2018   CO2 24 12/25/2018   GLUCOSE 84 12/25/2018   BUN 14 12/25/2018   CREATININE 0.87 12/25/2018   BILITOT 0.3 12/25/2018   ALKPHOS 59 12/25/2018   AST 15 12/25/2018   ALT 10 12/25/2018   PROT 6.2 12/25/2018   ALBUMIN 3.8 12/25/2018   CALCIUM 9.0 12/25/2018   ANIONGAP 7 01/14/2018   Lab Results  Component Value Date   CHOL 195 12/25/2018   Lab Results  Component Value Date   HDL 57 12/25/2018   Lab Results  Component Value Date   LDLCALC 119 (H) 12/25/2018   Lab Results  Component Value Date   TRIG 109 12/25/2018   Lab  Results  Component Value Date   CHOLHDL 3.4 12/25/2018   No results found for: HGBA1C     Assessment & Plan:   Zoà was seen today for ear pain.  Diagnoses and all orders for this visit:  Tenderness of lymph node Left tonsillar lymph node with mild tenderness x 1 day, improving. No enlargement or erythema. No other cervical adenopathy. No other symptoms. Discussed could be beginning of early oncoming viral infection. Tylenol as needed. Return to office for new or worsening symptoms, or if symptoms persist.   The patient indicates understanding of these issues and agrees with the plan.  Gwenlyn Perking, FNP

## 2020-12-30 ENCOUNTER — Ambulatory Visit: Payer: 59 | Admitting: Physician Assistant

## 2021-01-04 DIAGNOSIS — Z13220 Encounter for screening for lipoid disorders: Secondary | ICD-10-CM | POA: Diagnosis not present

## 2021-01-04 DIAGNOSIS — N951 Menopausal and female climacteric states: Secondary | ICD-10-CM | POA: Diagnosis not present

## 2021-01-04 DIAGNOSIS — Z13228 Encounter for screening for other metabolic disorders: Secondary | ICD-10-CM | POA: Diagnosis not present

## 2021-01-04 DIAGNOSIS — Z1329 Encounter for screening for other suspected endocrine disorder: Secondary | ICD-10-CM | POA: Diagnosis not present

## 2021-01-17 DIAGNOSIS — G4733 Obstructive sleep apnea (adult) (pediatric): Secondary | ICD-10-CM | POA: Diagnosis not present

## 2021-01-31 ENCOUNTER — Other Ambulatory Visit (HOSPITAL_COMMUNITY): Payer: Self-pay

## 2021-02-06 ENCOUNTER — Other Ambulatory Visit (HOSPITAL_COMMUNITY): Payer: Self-pay

## 2021-02-07 ENCOUNTER — Encounter: Payer: Self-pay | Admitting: Physician Assistant

## 2021-02-14 ENCOUNTER — Other Ambulatory Visit (HOSPITAL_COMMUNITY): Payer: Self-pay

## 2021-02-14 ENCOUNTER — Ambulatory Visit (INDEPENDENT_AMBULATORY_CARE_PROVIDER_SITE_OTHER): Payer: Self-pay | Admitting: Physician Assistant

## 2021-02-14 ENCOUNTER — Encounter: Payer: Self-pay | Admitting: Physician Assistant

## 2021-02-14 ENCOUNTER — Other Ambulatory Visit: Payer: Self-pay

## 2021-02-14 DIAGNOSIS — F411 Generalized anxiety disorder: Secondary | ICD-10-CM

## 2021-02-14 DIAGNOSIS — G4700 Insomnia, unspecified: Secondary | ICD-10-CM

## 2021-02-14 DIAGNOSIS — F339 Major depressive disorder, recurrent, unspecified: Secondary | ICD-10-CM

## 2021-02-14 MED ORDER — ZOLPIDEM TARTRATE 10 MG PO TABS
5.0000 mg | ORAL_TABLET | Freq: Every evening | ORAL | 5 refills | Status: DC | PRN
  Filled 2021-02-14: qty 30, 30d supply, fill #0
  Filled 2021-05-03: qty 30, 30d supply, fill #1
  Filled 2021-07-25: qty 30, 30d supply, fill #0

## 2021-02-14 MED ORDER — VILAZODONE HCL 20 MG PO TABS
20.0000 mg | ORAL_TABLET | ORAL | 1 refills | Status: DC
  Filled 2021-02-14: qty 30, 30d supply, fill #0

## 2021-02-14 MED ORDER — ALPRAZOLAM 0.5 MG PO TABS
0.5000 mg | ORAL_TABLET | Freq: Two times a day (BID) | ORAL | 5 refills | Status: DC | PRN
  Filled 2021-02-14: qty 30, 15d supply, fill #0
  Filled 2021-05-03: qty 30, 15d supply, fill #1
  Filled 2021-07-25: qty 30, 15d supply, fill #0

## 2021-02-14 NOTE — Patient Instructions (Signed)
On the Trintellix 20 mg take 1 pill daily for 1 week, then 1/2 pill daily for 1 week and then stop. At the same time start the Viibryd starter pack sample that I am giving you in the office today.  You will take 10 mg daily for 1 week and then increase to 20 mg daily.  The prescription sent in is for 20 mg.

## 2021-02-14 NOTE — Progress Notes (Signed)
[facility]  Patient ID: Aimee Hart,  MRN: [id number]  PCP: Baruch Gouty, FNP  Date of Evaluation: 02/14/2021 Time spent:40 minutes  Chief Complaint:  Chief Complaint   Depression; Anxiety; Insomnia; Follow-up      HISTORY/CURRENT STATUS: HPI For routine med check.  At the last visit we obtained a GeneSight specimen.  Here to discuss the results.  Is exercising more, eating a little better, Ambien helps and feels more rested. So, feels a little better overall, not sure if it's the Trintellix or all of the above.  She continues to work nights 24 hours a week only.  That is working out better for her, even though it stressful its less than she was experiencing on the day shift working more hours per week.  She is a nurse at the hospital.  Still does not do anything for fun.  She works and goes home and that is about it.  She does not sleep all the time or laying in bed.  She does not cry easily.  Energy and motivation depend on the situation.  States that when her kids were younger she did things because of their activities and made friends with some of their friends parents.  But now that they are grown and are out of the house, she does not really have friends or socialize with people.  She wants to and does get lonely but does not put the effort into making friends.  Denies suicidal or homicidal thoughts.  Anxiety is well controlled with Xanax.  She does not use it all the time but it is effective when needed.  Denies dizziness, syncope, seizures, numbness, tingling, tremor, tics, unsteady gait, slurred speech, confusion. Denies muscle or joint pain, stiffness, or dystonia.  Individual Medical History/ Review of Systems: Changes? :No    Past medications for mental health diagnoses include: Zoloft, Prozac, Paxil, Abilify, Effexor, Celexa, Cymbalta, Lamictal, Ambien, melatonin, Wellbutrin SR, Trintellix  Allergies: Patient has no known allergies.  Current  Medications:  Current Outpatient Medications:    ibuprofen (ADVIL,MOTRIN) 200 MG tablet, Take 600 mg by mouth every 6 (six) hours as needed for headache, mild pain or moderate pain., Disp: , Rfl:    omeprazole (PRILOSEC) 10 MG capsule, Take 10 mg by mouth daily., Disp: , Rfl:    Vilazodone HCl 20 MG TABS, Take 1 tablet (20 mg total) by mouth every morning., Disp: 30 tablet, Rfl: 1   vortioxetine HBr (TRINTELLIX) 20 MG TABS tablet, TAKE 1 TABLET BY MOUTH DAILY (Patient taking differently: Take 30 mg by mouth daily.), Disp: 90 tablet, Rfl: 1   ALPRAZolam (XANAX) 0.5 MG tablet, Take 1 tablet (0.5 mg total) by mouth 2 (two) times daily as needed for anxiety., Disp: 30 tablet, Rfl: 5   zolpidem (AMBIEN) 10 MG tablet, Take 1/2 - 1 tablet by mouth at bedtime as needed for sleep., Disp: 30 tablet, Rfl: 5 Medication Side Effects: none  Family Medical/ Social History: Changes? No    MENTAL HEALTH EXAM:  There were no vitals taken for this visit.There is no height or weight on file to calculate BMI.  General Appearance: Casual, Neat and Well Groomed  Eye Contact:  Good  Speech:  Clear and Coherent and Normal Rate  Volume:  Normal  Mood:  Euthymic  Affect:  Appropriate  Thought Process:  Goal Directed and Descriptions of Associations: Intact  Orientation:  Full (Time, Place, and Person)  Thought Content: Logical   Suicidal Thoughts:  No  Homicidal Thoughts:  No  Memory:  WNL  Judgement:  Good  Insight:  Good  Psychomotor Activity:  Normal  Concentration:  Concentration: Good and Attention Span: Good  Recall:  Good  Fund of Knowledge: Good  Language: Good  Assets:  Desire for Improvement Financial Resources/Insurance Housing Resilience Transportation Vocational/Educational  ADL's:  Intact  Cognition: WNL  Prognosis:  Good    DIAGNOSES:    ICD-10-CM   1. 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 Insomnia, unspecified type  G47.00     3. Generalized  anxiety disorder  F41.1        Receiving Psychotherapy: No    RECOMMENDATIONS:  PDMP was reviewed.  Ambien filled 10/11/2020.  Xanax filled 11/10/2020. I provided 40 minutes of face to face time during this encounter, including time spent before and after the visit in records review, medical decision making, counseling pertinent to today's visit, and charting.  We had a long discussion about her GeneSight test results.  Interestingly, all of the antidepressants she has tried are in the moderate gene drug interaction category.  Only Pristiq, Fetzima, and Viibryd are noted to be used as directed.  She has never tried any of those.  I recommend changing to Viibryd.  Pristiq may be very difficult for her to get off of, if we ever need to stop it, as Effexor was in the past, so I do not recommend that.  I am more familiar with Viibryd been [place] and have had good results with the Viibryd and that is the reason for match using it.  We discussed benefits, risk, and side effects and she would like to try it. We also discussed that she has decreased methyl folate to folate conversion, she may need to go on Deplin or Cerefolin, but I think changing Trintellix to Viibryd would be a good first step and then we can as 1 of those meds in if need be.  I discussed MTHFR with her and how OTC folic acid meds are not enough to make a dent in the problem.  The Deplin and Cerefolin can be expensive, around $60 per month because the FDA recognizes them as a medical food, therefore not covered by insurance.  So we will hold off on that for now. Continue Ambien 10 mg, 1/2-1 nightly as needed sleep. Wean off Trintellix 20 mg daily for 1 week then 10 mg daily for 1 week then stop. Start Viibryd 10 mg daily for 1 week then increase to 20 mg daily. Continue Xanax 0.5 mg, 1 p.o. twice daily as needed. Refer to [facility] for therapy.   Return in 4-6 weeks.  Donnal Moat, PA-C

## 2021-02-23 IMAGING — RF DG UGI W SINGLE CM
10 of 11 series · 12 of 14 positions shown · non-contrast
Comparison: Upper GI 0557

CLINICAL DATA: Gastric banding device. Bariatric surgery0557 x with
gastric banding device placed. Worsening gastroesophageal reflux.

EXAM:
UPPER GI SERIES WITH KUB
TECHNIQUE: After obtaining a scout radiograph a routine upper GI series was
performed using thin barium
FLUOROSCOPY TIME:  Fluoroscopy Time:  1 minutes 36 seconds
Radiation Exposure Index (if provided by the fluoroscopic device):
62.8 mGy
Number of Acquired Spot Images: 8

[Series 1: t abdomen supine · 0.15mm/px · 1 of 1 slices shown]
[im 1/1]
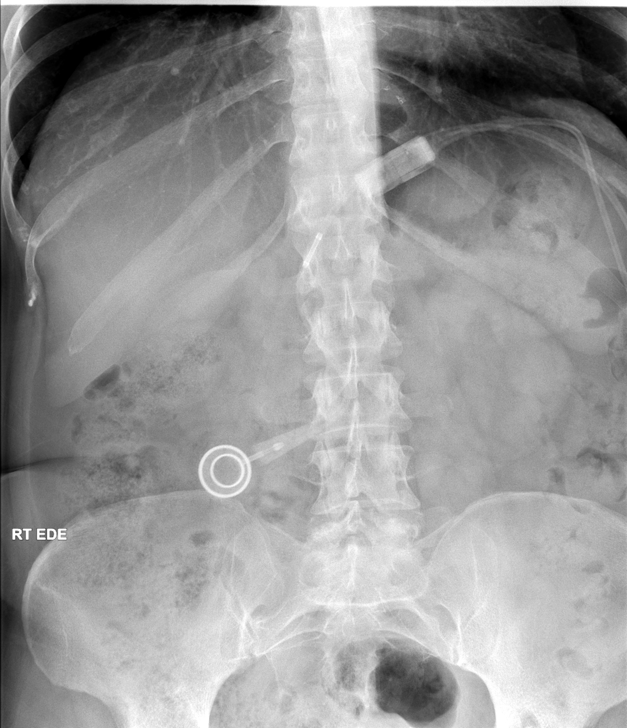

[Series 2: fluoro_barium singleshot_bw · 0.17mm/px · 1 of 1 slices shown (1 of 8)]
[im 1/1]
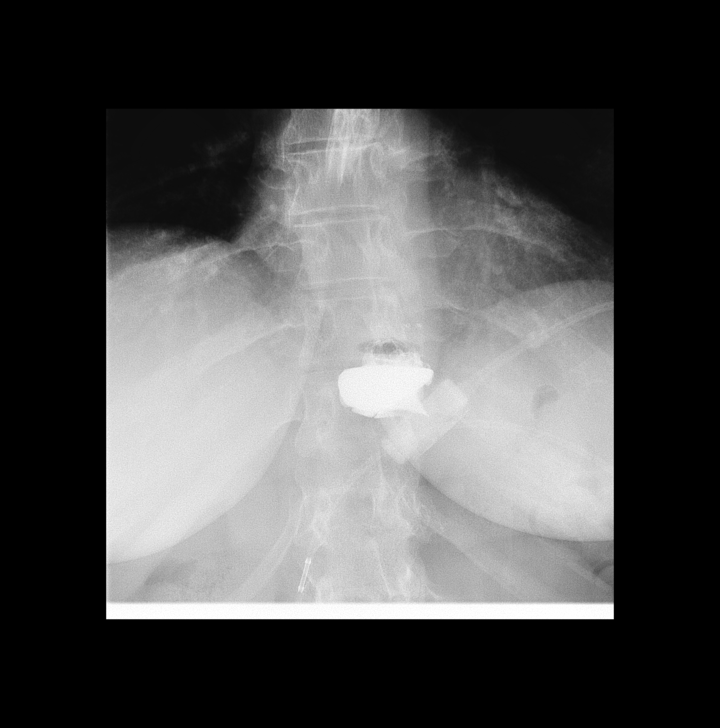

[Series 3: cp_standard · 0.34mm/px · 3 of 61 frames shown]
[frame 10/61]
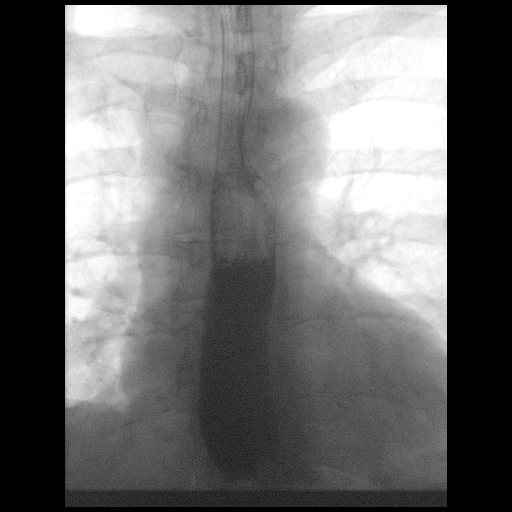
[frame 51/61]
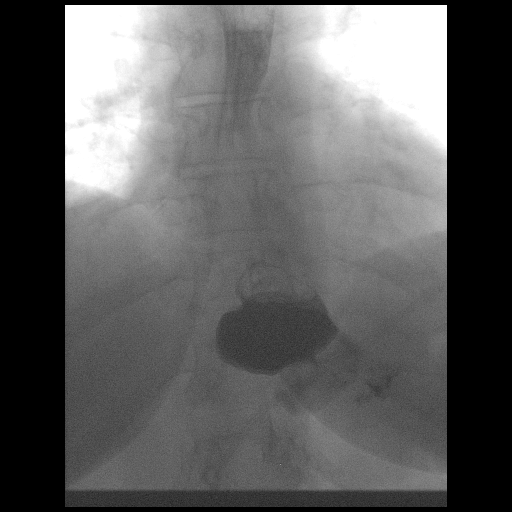
[frame 52/61]
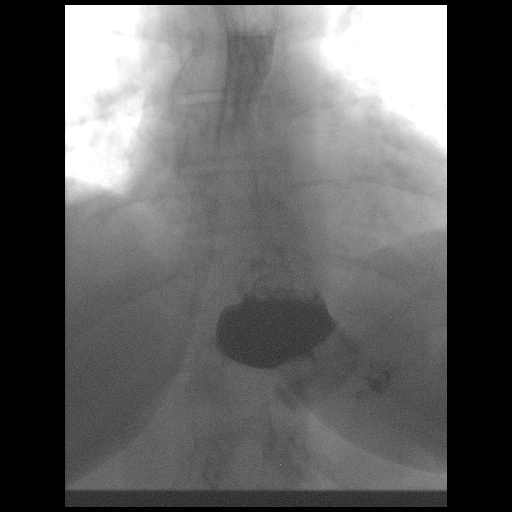

[Series 4: fluoro_barium singleshot_bw · 0.17mm/px · 1 of 1 slices shown (2 of 8)]
[im 1/1]
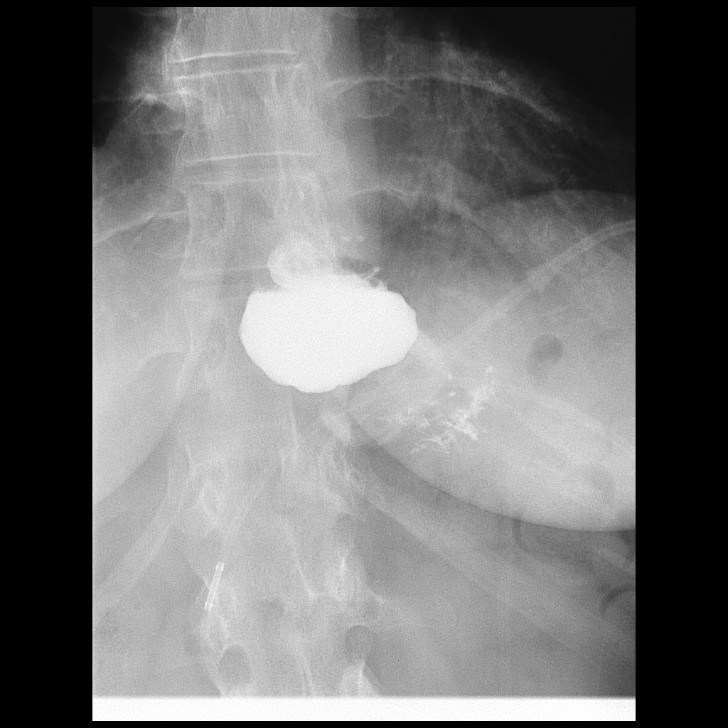

[Series 5: fluoro_barium singleshot_bw · 0.17mm/px · 1 of 1 slices shown (3 of 8)]
[im 1/1]
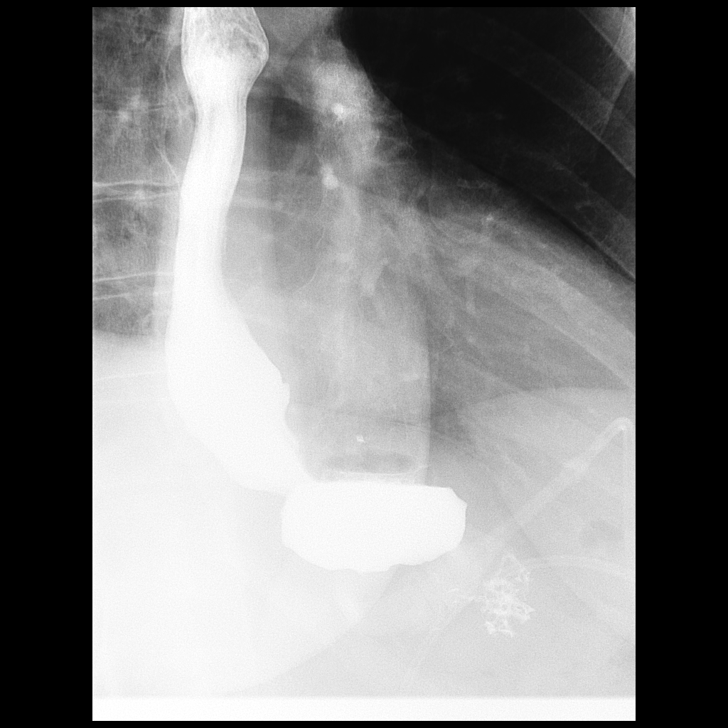

[Series 6: fluoro_barium singleshot_bw · 0.17mm/px · 1 of 1 slices shown (4 of 8)]
[im 1/1]
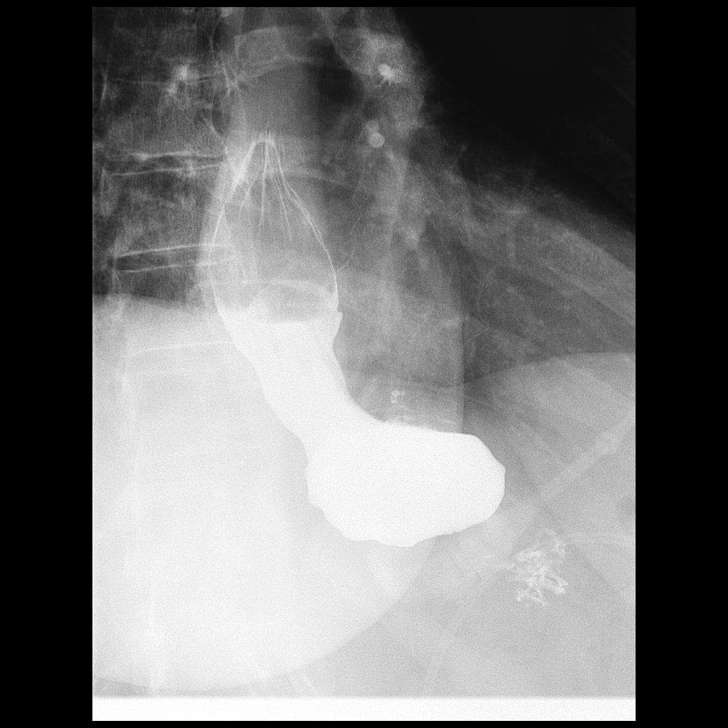

[Series 7: fluoro_barium singleshot_bw · 0.17mm/px · 1 of 1 slices shown (5 of 8)]
[im 1/1]
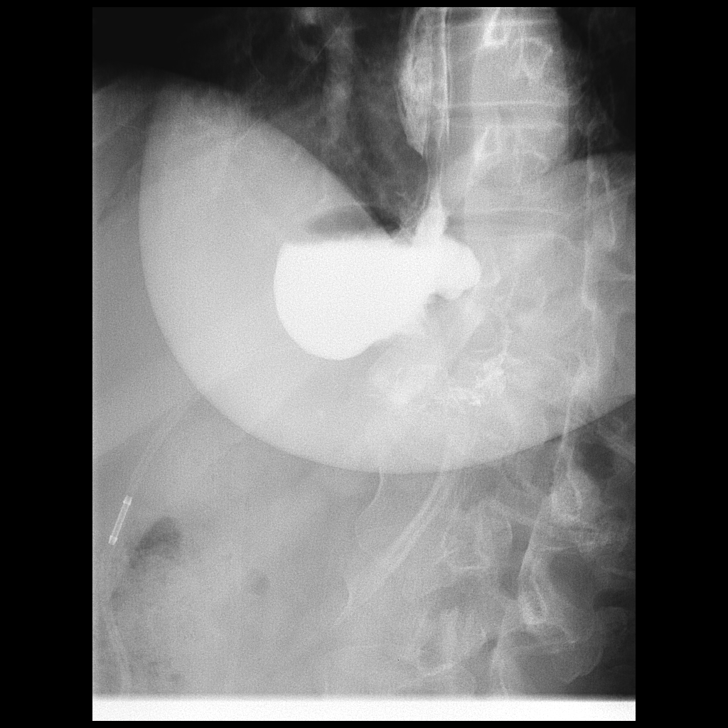

[Series 9: fluoro_barium singleshot_bw · 0.17mm/px · 1 of 1 slices shown (6 of 8)]
[im 1/1]
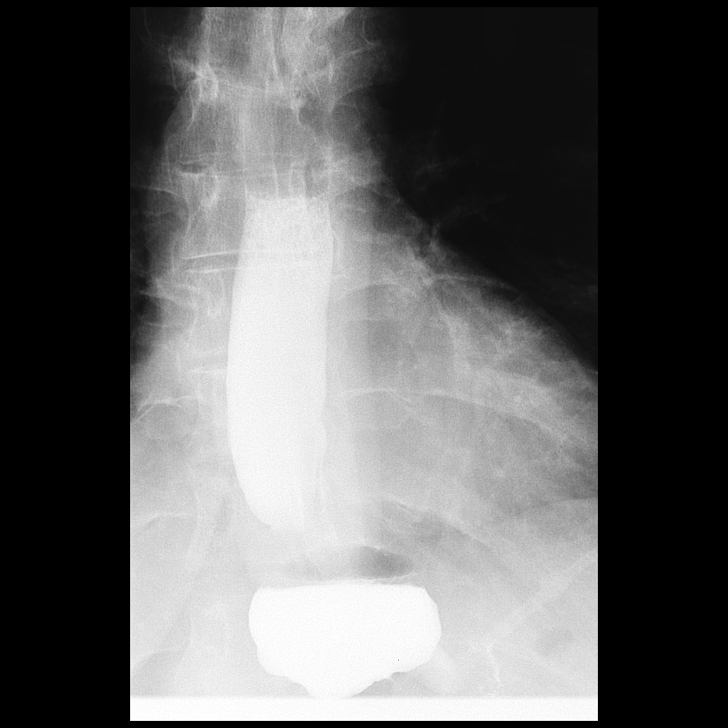

[Series 10: fluoro_barium singleshot_bw · 0.17mm/px · 1 of 1 slices shown (7 of 8)]
[im 1/1]
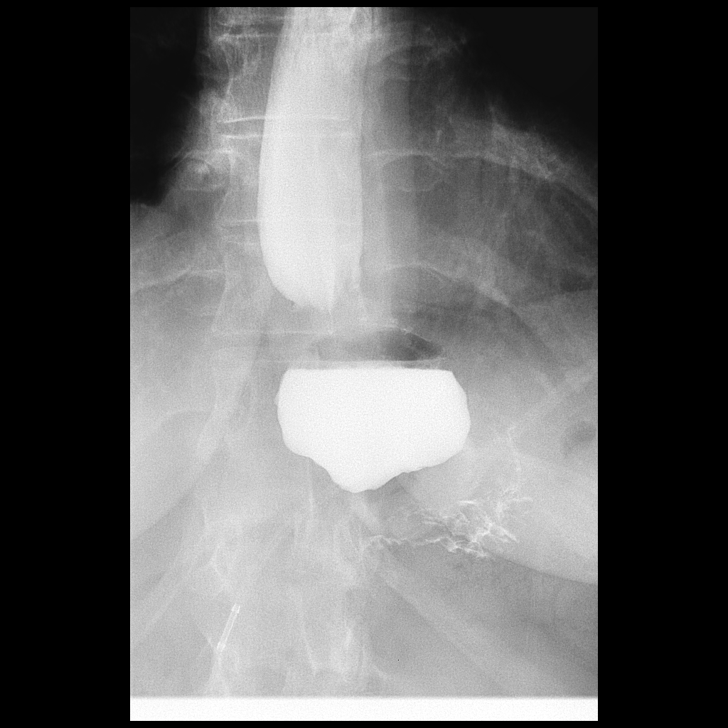

[Series 11: fluoro_barium singleshot_bw · 0.17mm/px · 1 of 1 slices shown (8 of 8)]
[im 1/1]
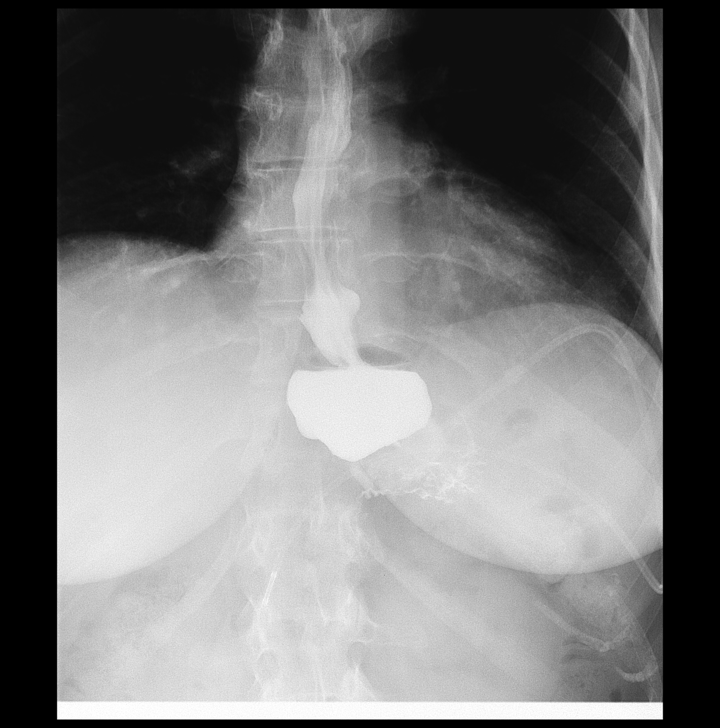

[12 of 14 positions shown; findings below may reference images not displayed]

FINDINGS: Initial KUB demonstrates a gastric banding device in proper
orientation ([DATE] to [DATE]). The access tubing port is in the RIGHT
lower quadrant.

Or contrast flowed into the distal esophagus where there is
restriction at the banding device. Contrast collects in a dilated
distal esophagus versus small hiatal hernia just above the banding
device. Contrast collects in a mildly dilated esophagus and backs
up to the midesophagus.

Only trace amount contrast enters the stomach through the banding
device. Patient was evaluated in upright position.
IMPRESSION: 1. Minimal contrast flows through the gastric banding device over
course of the study with patient in standing position.
2. A patulous distal esophagus versus hiatal hernia.
3. Mildly patulous esophagus without mass lesion or obstructing
lesion identified.

## 2021-03-28 ENCOUNTER — Other Ambulatory Visit (HOSPITAL_COMMUNITY): Payer: Self-pay

## 2021-03-28 ENCOUNTER — Encounter: Payer: Self-pay | Admitting: Physician Assistant

## 2021-03-28 ENCOUNTER — Ambulatory Visit (INDEPENDENT_AMBULATORY_CARE_PROVIDER_SITE_OTHER): Payer: 59 | Admitting: Physician Assistant

## 2021-03-28 ENCOUNTER — Other Ambulatory Visit: Payer: Self-pay

## 2021-03-28 DIAGNOSIS — F339 Major depressive disorder, recurrent, unspecified: Secondary | ICD-10-CM | POA: Diagnosis not present

## 2021-03-28 DIAGNOSIS — G4726 Circadian rhythm sleep disorder, shift work type: Secondary | ICD-10-CM

## 2021-03-28 DIAGNOSIS — F411 Generalized anxiety disorder: Secondary | ICD-10-CM | POA: Diagnosis not present

## 2021-03-28 MED ORDER — CEREFOLIN NAC 6-90.314-2-600 MG PO TABS: 1.0000 | ORAL_TABLET | Freq: Every day | ORAL | 11 refills | Status: DC

## 2021-03-28 MED ORDER — VILAZODONE HCL 40 MG PO TABS
40.0000 mg | ORAL_TABLET | Freq: Every day | ORAL | 1 refills | Status: DC
  Filled 2021-03-28: qty 30, 30d supply, fill #0
  Filled 2021-04-25: qty 30, 30d supply, fill #1

## 2021-03-28 NOTE — Progress Notes (Signed)
[facility]  Patient ID: Aimee Hart,  MRN: [id number]  PCP: Baruch Gouty, FNP  Date of Evaluation: 03/28/2021 Time spent:40 minutes  Chief Complaint:  Chief Complaint   Anxiety; Depression; Insomnia; Follow-up       HISTORY/CURRENT STATUS: HPI For routine med check.  At the last visit 6 weeks ago we weaned off the Trintellix and started Viibryd.  She has not noticed any change in her mood at all.  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  Appetite is normal, she started on Mounjaro about 3 weeks ago and has not lost any weight yet.  She sleeps okay although does not feel rested.  Does take the Ambien or else she cannot sleep during the day on weekends.  No suicidal or homicidal thoughts.  There have been a lot of changes in her life in the past year or so.  She lost both of her parents, she changed dayshift to night shift and working on the weekends.  So wonders if there are a lot of other factors that may be causing her depression.  Anxiety is well controlled with Xanax.  She does not use it all the time but it is effective when needed.  Patient denies increased energy with decreased need for sleep, no increased talkativeness, no racing thoughts, no impulsivity or risky behaviors, no increased spending, no increased libido, no grandiosity, no increased irritability or anger, and no hallucinations.  Denies dizziness, syncope, seizures, numbness, tingling, tremor, tics, unsteady gait, slurred speech, confusion. Denies muscle or joint pain, stiffness, or dystonia.  Individual Medical History/ Review of Systems: Changes? :No    Past medications for mental health diagnoses include: Zoloft, Prozac, Paxil, Abilify, Effexor, Celexa, Cymbalta, Lamictal, Ambien, melatonin, Wellbutrin SR, Trintellix  Allergies: Patient has no known  allergies.  Current Medications:  Current Outpatient Medications:    ALPRAZolam (XANAX) 0.5 MG tablet, Take 1 tablet (0.5 mg total) by mouth 2 (two) times daily as needed for anxiety., Disp: 30 tablet, Rfl: 5   ibuprofen (ADVIL,MOTRIN) 200 MG tablet, Take 600 mg by mouth every 6 (six) hours as needed for headache, mild pain or moderate pain., Disp: , Rfl:    Methylfol-Algae-B12-Acetylcyst (CEREFOLIN NAC) 6-90.314-2-600 MG TABS, Take 1 capsule by mouth daily., Disp: 30 tablet, Rfl: 11   MOUNJARO 2.5 MG/0.5ML Pen, SMARTSIG:2.5 Milligram(s) SUB-Q Once a Week, Disp: , Rfl:    omeprazole (PRILOSEC) 10 MG capsule, Take 10 mg by mouth daily., Disp: , Rfl:    Vilazodone HCl (VIIBRYD) 40 MG TABS, Take 1 tablet  by mouth daily., Disp: 30 tablet, Rfl: 1   zolpidem (AMBIEN) 10 MG tablet, Take 1/2 - 1 tablet by mouth at bedtime as needed for sleep., Disp: 30 tablet, Rfl: 5 Medication Side Effects: none  Family Medical/ Social History: Changes? No   MENTAL HEALTH EXAM:  There were no vitals taken for this visit.There is no height or weight on file to calculate BMI.  General Appearance: Casual, Neat and Well Groomed  Eye Contact:  Good  Speech:  Clear and Coherent and Normal Rate  Volume:  Normal  Mood:  Depressed  Affect:  Congruent  Thought Process:  Goal Directed and Descriptions of Associations: Intact  Orientation:  Full (Time, Place, and Person)  Thought Content: Logical   Suicidal Thoughts:  No  Homicidal Thoughts:  No  Memory:  WNL  Judgement:  Good  Insight:  Good  Psychomotor Activity:  Normal  Concentration:  Concentration: Good and Attention Span: Good  Recall:  Good  Fund of Knowledge: Good  Language: Good  Assets:  Desire for Improvement Financial Resources/Insurance Housing Resilience Transportation Vocational/Educational  ADL's:  Intact  Cognition: WNL  Prognosis:  Good   GeneSight results on chart, scanned under labs.   DIAGNOSES:    ICD-10-CM   1. Recurrent major  depression resistant to treatment ([place])  F33.9     2. Generalized anxiety disorder  F41.1     3. 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  Ambien filled 02/14/2021.  Xanax filled 02/14/2021.   I provided 40 minutes of face to face time during this encounter, including time spent before and after the visit in records review, medical decision making, counseling pertinent to today's visit, and charting.  She has not responded to the Viibryd at this dose, I recommend maxing that out before we give up on it.  She would like to try that. At the last visit we discussed the Gene site test results.  She did have reduced folic acid conversion and we had discussed whether she needs to be on Deplin or Cerefolin NAC or not.  We agreed today that we should try it. We also discussed other options including TMS, Spravato, ECT.  My first recommendation would be Spravato.  She wants to do more research before deciding on going any of those routes.  Also bumping up the Viibryd might do the trick and we want to have to consider 1 of those options.  Pros and cons were discussed about each treatment. Start Cerefolin NAC 1 p.o. daily.  3 weeks worth of samples given.  She understands this will be sent to her from a company called [employer], there info was given to her, she knows to answer the phone when they contact her to get billing info. Continue Ambien 10 mg, 1/2-1 nightly as needed sleep. Increase Viibryd to 40 mg p.o. daily.   Continue Xanax 0.5 mg, 1 p.o. twice daily as needed. Refer to [facility] for therapy.   Return in 4 weeks.  Donnal Moat, PA-C

## 2021-04-25 ENCOUNTER — Other Ambulatory Visit (HOSPITAL_COMMUNITY): Payer: Self-pay

## 2021-05-02 ENCOUNTER — Other Ambulatory Visit (HOSPITAL_COMMUNITY): Payer: Self-pay

## 2021-05-02 ENCOUNTER — Ambulatory Visit: Payer: 59 | Admitting: Physician Assistant

## 2021-05-02 ENCOUNTER — Other Ambulatory Visit: Payer: Self-pay

## 2021-05-02 ENCOUNTER — Encounter: Payer: Self-pay | Admitting: Physician Assistant

## 2021-05-02 VITALS — BP 110/82 | HR 96

## 2021-05-02 DIAGNOSIS — G4726 Circadian rhythm sleep disorder, shift work type: Secondary | ICD-10-CM

## 2021-05-02 DIAGNOSIS — F411 Generalized anxiety disorder: Secondary | ICD-10-CM

## 2021-05-02 DIAGNOSIS — F339 Major depressive disorder, recurrent, unspecified: Secondary | ICD-10-CM | POA: Diagnosis not present

## 2021-05-02 DIAGNOSIS — G4700 Insomnia, unspecified: Secondary | ICD-10-CM | POA: Diagnosis not present

## 2021-05-02 MED ORDER — VILAZODONE HCL 40 MG PO TABS
40.0000 mg | ORAL_TABLET | Freq: Every day | ORAL | 1 refills | Status: DC
  Filled 2021-05-02 – 2021-05-30 (×3): qty 30, 30d supply, fill #0
  Filled 2021-07-12: qty 30, 30d supply, fill #1

## 2021-05-02 NOTE — Progress Notes (Signed)
[facility] Med Check  Patient ID: Aimee Hart,  MRN: [id number]  PCP: Baruch Gouty, FNP  Date of Evaluation: 05/02/2021 Time spent:20 minutes  Chief Complaint:  Chief Complaint   Anxiety; Depression; Insomnia; Follow-up     HISTORY/CURRENT STATUS: HPI For routine med check.  Aimee Hart has not noticed much difference after increasing the Viibryd at the last visit.  That was 4 weeks ago.  Does not feel like there has been any improvement with the Cerefolin NAC either.  She is still not enjoying anything.  Energy and motivation are low.  Does not cry easily.  Appetite is normal and weight is stable.  She sleeps okay sometimes but because she works nights on the weekends it does affect rested or not.  No suicidal or homicidal thoughts.  Still feels anxious at times, of course it depends on the circumstances, then she feels a sense of unease like something bad is going to happen.  She wants to be in therapy but one of the therapists that I recommended did not respond to her call, so she is trying to get in somewhere else.  Patient denies increased energy with decreased need for sleep, no increased talkativeness, no racing thoughts, no impulsivity or risky behaviors, no increased spending, no increased libido, no grandiosity, no increased irritability or anger, and no hallucinations.  Denies dizziness, syncope, seizures, numbness, tingling, tremor, tics, unsteady gait, slurred speech, confusion. Denies muscle or joint pain, stiffness, or dystonia.  Individual Medical History/ Review of Systems: Changes? :No    Past medications for mental health diagnoses include: Zoloft, Prozac, Paxil, Abilify, Effexor, Celexa, Cymbalta, Lamictal, Ambien, melatonin, Wellbutrin SR, Trintellix  Allergies: Patient has no known allergies.  Current Medications:  Current Outpatient Medications:    ALPRAZolam (XANAX) 0.5 MG tablet, Take 1 tablet (0.5 mg total) by mouth 2 (two) times daily as needed for  anxiety., Disp: 30 tablet, Rfl: 5   ibuprofen (ADVIL,MOTRIN) 200 MG tablet, Take 600 mg by mouth every 6 (six) hours as needed for headache, mild pain or moderate pain., Disp: , Rfl:    Methylfol-Algae-B12-Acetylcyst (CEREFOLIN NAC) 6-90.314-2-600 MG TABS, Take 1 capsule by mouth daily., Disp: 30 tablet, Rfl: 11   omeprazole (PRILOSEC) 10 MG capsule, Take 10 mg by mouth daily., Disp: , Rfl:    MOUNJARO 7.5 MG/0.5ML Pen, SMARTSIG:7.5 Milligram(s) SUB-Q Once a Week, Disp: , Rfl:    Vilazodone HCl (VIIBRYD) 40 MG TABS, Take 1 tablet  by mouth daily., Disp: 30 tablet, Rfl: 1   zolpidem (AMBIEN) 10 MG tablet, Take 1/2 - 1 tablet by mouth at bedtime as needed for sleep., Disp: 30 tablet, Rfl: 5 Medication Side Effects: none  Family Medical/ Social History: Changes? No   MENTAL HEALTH EXAM:  Blood pressure 110/82, pulse 96.There is no height or weight on file to calculate BMI.  General Appearance: Casual, Neat and Well Groomed  Eye Contact:  Good  Speech:  Clear and Coherent and Normal Rate  Volume:  Normal  Mood:  Anxious and Depressed  Affect:  Congruent  Thought Process:  Goal Directed and Descriptions of Associations: Intact  Orientation:  Full (Time, Place, and Person)  Thought Content: Logical   Suicidal Thoughts:  No  Homicidal Thoughts:  No  Memory:  WNL  Judgement:  Good  Insight:  Good  Psychomotor Activity:  Normal  Concentration:  Concentration: Good and Attention Span: Good  Recall:  Good  Fund of Knowledge: Good  Language: Good  Assets:  Desire for Improvement  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   DIAGNOSES:    ICD-10-CM   1. Recurrent major depression resistant to treatment (Aimee Hart)  F33.9     2. Generalized anxiety disorder  F41.1     3. Insomnia, unspecified type  G47.00     4. 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  Ambien filled 02/14/2021.  Xanax filled 02/14/2021.   I provided 20 minutes of face to face time during this encounter, including time spent before and after the visit in records review, medical decision making, counseling pertinent to today's visit, and charting.  Since she has been on this dose of Viibryd for only 1 month, we decided to give it a little while longer to work.  Also recommend that she continue the NAC for another couple of months. Sleep hygiene discussed. Continue Cerefolin NAC 1 p.o. daily.   Continue Ambien 10 mg, 1/2-1 nightly as needed sleep. Continue Viibryd 40 mg p.o. daily.   Continue Xanax 0.5 mg, 1 p.o. twice daily as needed. Refer to Aimee Cloud, LCSW here in our office. Return in 6 weeks.  Donnal Moat, PA-C

## 2021-05-03 ENCOUNTER — Other Ambulatory Visit (HOSPITAL_COMMUNITY): Payer: Self-pay

## 2021-05-04 ENCOUNTER — Ambulatory Visit (INDEPENDENT_AMBULATORY_CARE_PROVIDER_SITE_OTHER): Payer: 59 | Admitting: Psychiatry

## 2021-05-04 ENCOUNTER — Other Ambulatory Visit: Payer: Self-pay

## 2021-05-04 DIAGNOSIS — F339 Major depressive disorder, recurrent, unspecified: Secondary | ICD-10-CM | POA: Diagnosis not present
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
## 2021-05-05 ENCOUNTER — Encounter: Payer: Self-pay | Admitting: Family Medicine

## 2021-05-05 ENCOUNTER — Ambulatory Visit: Payer: 59 | Admitting: Family Medicine

## 2021-05-05 VITALS — BP 104/73 | HR 88 | Temp 98.7°F | Ht 65.5 in | Wt 199.0 lb

## 2021-05-05 DIAGNOSIS — G4733 Obstructive sleep apnea (adult) (pediatric): Secondary | ICD-10-CM

## 2021-05-05 DIAGNOSIS — R002 Palpitations: Secondary | ICD-10-CM | POA: Diagnosis not present

## 2021-05-05 DIAGNOSIS — Z9989 Dependence on other enabling machines and devices: Secondary | ICD-10-CM

## 2021-05-05 NOTE — Progress Notes (Signed)
Subjective:  Patient ID: Aimee Hart, female    DOB: 12-01-1965, 56 y.o.   MRN: [id number]  Patient Care Team: Baruch Gouty, FNP as PCP - General (Family Medicine)   Chief Complaint:  Palpitations (Fluttering sensation no pain)   HPI: Aimee Hart is a 56 y.o. female presenting on 05/05/2021 for Palpitations (Fluttering sensation no pain)   Patient presents today with complaints of intermittent palpitations over the last month. Reports he only noted change in starting [facility] with for weight loss and Viibryd for depression.  She denies excessive caffeine use.  She does have OSA requiring CPAP therapy.  States she is compliant with her CPAP about 90% of the time.  She has not had machine checked or settings adjusted in over 2 years.  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  No associated chest pain, lightheadedness, dizziness, syncope, diaphoresis, nausea, vomiting, or weakness.  Palpitations  This is a recurrent problem. The current episode started more than 1 month ago. The problem occurs intermittently. The problem has been waxing and waning. The symptoms are aggravated by unknown. Pertinent negatives include no anxiety, chest fullness, chest pain, coughing, diaphoresis, dizziness, fever, irregular heartbeat, malaise/fatigue, nausea, near-syncope, numbness, shortness of breath, syncope, vomiting or weakness. She has tried nothing for the symptoms.     Relevant past medical, surgical, family, and social history reviewed and updated as indicated.  Allergies and medications reviewed and updated. Data reviewed: Chart in Epic.   Past Medical History:  Diagnosis Date   Depression    GERD (gastroesophageal reflux disease)    Hypercholesteremia    Sleep apnea     Past Surgical History:  Procedure Laterality Date   LAPAROSCOPIC ROUX-EN-Y GASTRIC BYPASS WITH UPPER ENDOSCOPY AND REMOVAL OF LAP BAND  2008   TONSILLECTOMY  1980    Social History    Socioeconomic History   Marital status: Divorced    Spouse name: Not on file   Number of children: 2   Years of education: college   Highest education level: Not on file  Occupational History    Comment: RN  Tobacco Use   Smoking status: Former    Packs/day: 1.00    Years: 10.00    Pack years: 10.00    Types: Cigarettes    Quit date: 02/07/1996    Years since quitting: 25.2   Smokeless tobacco: Never  Vaping Use   Vaping Use: Never used  Substance and Sexual Activity   Alcohol use: Yes    Comment: 1/week   Drug use: Never   Sexual activity: Yes  Other Topics Concern   Not on file  Social History Narrative   Lives with son   Caffeine 16 oz daily   Social Determinants of Health   Financial Resource Strain: Not on file  Food Insecurity: Not on file  Transportation Needs: Not on file  Physical Activity: Not on file  Stress: Not on file  Social Connections: Not on file  Intimate Partner Violence: Not on file    Outpatient Encounter Medications as of 05/05/2021  Medication Sig   ALPRAZolam (XANAX) 0.5 MG tablet Take 1 tablet (0.5 mg total) by mouth 2 (two) times daily as needed for anxiety.   ibuprofen (ADVIL,MOTRIN) 200 MG tablet Take 600 mg by mouth every 6 (six) hours as needed for headache, mild pain or moderate pain.   Methylfol-Algae-B12-Acetylcyst (CEREFOLIN NAC) 6-90.314-2-600 MG TABS Take 1 capsule by mouth daily.  omeprazole (PRILOSEC) 10 MG capsule Take 10 mg by mouth daily.   Vilazodone HCl (VIIBRYD) 40 MG TABS Take 1 tablet  by mouth daily.   zolpidem (AMBIEN) 10 MG tablet Take 1/2 - 1 tablet by mouth at bedtime as needed for sleep.   [DISCONTINUED] MOUNJARO 2.5 MG/0.5ML Pen SMARTSIG:2.5 Milligram(s) SUB-Q Once a Week   MOUNJARO 7.5 MG/0.5ML Pen SMARTSIG:7.5 Milligram(s) SUB-Q Once a Week   No facility-administered encounter medications on file as of 05/05/2021.    No Known Allergies  Review of Systems  Constitutional:  Negative for activity change,  appetite change, chills, diaphoresis, fatigue, fever, malaise/fatigue and unexpected weight change.  Eyes:  Negative for photophobia and visual disturbance.  Respiratory:  Negative for cough and shortness of breath.   Cardiovascular:  Positive for palpitations. Negative for chest pain, leg swelling, syncope and near-syncope.  Gastrointestinal:  Negative for nausea and vomiting.  Genitourinary:  Negative for decreased urine volume and difficulty urinating.  Neurological:  Negative for dizziness, tremors, seizures, syncope, facial asymmetry, speech difficulty, weakness, light-headedness, numbness and headaches.  Psychiatric/Behavioral:  Negative for confusion. The patient is not nervous/anxious.   All other systems reviewed and are negative.      Objective:  BP 104/73    Pulse 88    Temp 98.7 F (37.1 C)    Ht 5' 5.5" (1.664 m)    Wt 199 lb (90.3 kg)    LMP 03/09/2021 (Approximate)    SpO2 97%    BMI 32.61 kg/m    Wt Readings from Last 3 Encounters:  05/05/21 199 lb (90.3 kg)  12/29/20 204 lb 2 oz (92.6 kg)  12/25/18 207 lb (93.9 kg)    Physical Exam Vitals and nursing note reviewed.  Constitutional:      General: She is not in acute distress.    Appearance: Normal appearance. She is obese. She is not ill-appearing, toxic-appearing or diaphoretic.  HENT:     Head: Normocephalic and atraumatic.     Mouth/Throat:     Mouth: Mucous membranes are moist.  Eyes:     Conjunctiva/sclera: Conjunctivae normal.     Pupils: Pupils are equal, round, and reactive to light.  Neck:     Vascular: No carotid bruit.  Cardiovascular:     Rate and Rhythm: Normal rate and regular rhythm.     Heart sounds: Normal heart sounds. No murmur heard.   No friction rub. No gallop.  Pulmonary:     Effort: Pulmonary effort is normal.     Breath sounds: Normal breath sounds.  Abdominal:     Palpations: Abdomen is soft.  Musculoskeletal:     Cervical back: Neck supple.     Right lower leg: No edema.      Left lower leg: No edema.  Skin:    General: Skin is warm and dry.     Capillary Refill: Capillary refill takes less than 2 seconds.  Neurological:     General: No focal deficit present.     Mental Status: She is alert and oriented to person, place, and time.  Psychiatric:        Mood and Affect: Mood normal.        Behavior: Behavior normal.        Thought Content: Thought content normal.        Judgment: Judgment normal.    Results for orders placed or performed in visit on 12/25/18  CMP14+EGFR  Result Value Ref Range   Glucose 84 65 - 99 mg/dL  BUN 14 6 - 24 mg/dL   Creatinine, Ser 0.87 0.57 - 1.00 mg/dL   GFR calc non Af Amer 76 >59 mL/min/1.73   GFR calc Af Amer 87 >59 mL/min/1.73   BUN/Creatinine Ratio 16 9 - 23   Sodium 140 134 - 144 mmol/L   Potassium 4.2 3.5 - 5.2 mmol/L   Chloride 104 96 - 106 mmol/L   CO2 24 20 - 29 mmol/L   Calcium 9.0 8.7 - 10.2 mg/dL   Total Protein 6.2 6.0 - 8.5 g/dL   Albumin 3.8 3.8 - 4.9 g/dL   Globulin, Total 2.4 1.5 - 4.5 g/dL   Albumin/Globulin Ratio 1.6 1.2 - 2.2   Bilirubin Total 0.3 0.0 - 1.2 mg/dL   Alkaline Phosphatase 59 39 - 117 IU/L   AST 15 0 - 40 IU/L   ALT 10 0 - 32 IU/L  Lipid panel  Result Value Ref Range   Cholesterol, Total 195 100 - 199 mg/dL   Triglycerides 109 0 - 149 mg/dL   HDL 57 >39 mg/dL   VLDL Cholesterol Cal 19 5 - 40 mg/dL   LDL Chol Calc (NIH) 119 (H) 0 - 99 mg/dL   Chol/HDL Ratio 3.4 0.0 - 4.4 ratio     EKG in office: SR 82, PR 140 ms, QT 352 ms, no acute ST-T changes or ectopy, low voltage. No significant changes from prior EKG.  Monia Pouch, FNP-C  Pertinent labs & imaging results that were available during my care of the patient were reviewed by me and considered in my medical decision making.  Assessment & Plan:  Elise was seen today for palpitations.  Diagnoses and all orders for this visit:  Heart palpitations Patient with intermittent and short lasting palpitations for the last month.   No associated pain.  No indications of ACS.  EKG unremarkable in office.  Differentials include electrolyte imbalance, dehydration, abnormal thyroid function, anemia, PE, undertreated sleep apnea, medication reaction, or anxiety.  Below labs pending.  Referral to cardiology for event monitor and echo.  Patient aware of red flags which require emergent evaluation and treatment.  Report any new, worsening, persistent symptoms.  Follow-up in 4 weeks or sooner if needed. -     EKG 12-Lead -     CBC with Differential/Platelet -     BMP8+EGFR -     Thyroid Panel With TSH -     Ambulatory referral to Cardiology -     Troponin T -     D-dimer, quantitative -     Magnesium  OSA on CPAP Has not had settings adjusted in over 2 years.  Patient advised to have machine checked and settings adjusted.    Continue all other maintenance medications.  Follow up plan: Return in about 4 weeks (around 06/02/2021), or if symptoms worsen or fail to improve.   Continue healthy lifestyle choices, including diet (rich in fruits, vegetables, and lean proteins, and low in salt and simple carbohydrates) and exercise (at least 30 minutes of moderate physical activity daily).  Educational handout given for palpitations  The above assessment and management plan was discussed with the patient. The patient verbalized understanding of and has agreed to the management plan. Patient is aware to call the clinic if they develop any new symptoms or if symptoms persist or worsen. Patient is aware when to return to the clinic for a follow-up visit. Patient educated on when it is appropriate to go to the emergency department.   Monia Pouch, FNP-C  [facility] [phone]

## 2021-05-06 LAB — D-DIMER, QUANTITATIVE: D-DIMER: 0.22 mg/L FEU (ref 0.00–0.49)

## 2021-05-06 LAB — CBC WITH DIFFERENTIAL/PLATELET
Basophils Absolute: 0.1 10*3/uL (ref 0.0–0.2)
Basos: 1 %
EOS (ABSOLUTE): 0.2 10*3/uL (ref 0.0–0.4)
Eos: 4 %
Hematocrit: 39.9 % (ref 34.0–46.6)
Hemoglobin: 14.1 g/dL (ref 11.1–15.9)
Immature Grans (Abs): 0 10*3/uL (ref 0.0–0.1)
Immature Granulocytes: 0 %
Lymphocytes Absolute: 1.8 10*3/uL (ref 0.7–3.1)
Lymphs: 31 %
MCH: 29.4 pg (ref 26.6–33.0)
MCHC: 35.3 g/dL (ref 31.5–35.7)
MCV: 83 fL (ref 79–97)
Monocytes Absolute: 0.5 10*3/uL (ref 0.1–0.9)
Monocytes: 9 %
Neutrophils Absolute: 3 10*3/uL (ref 1.4–7.0)
Neutrophils: 55 %
Platelets: 294 10*3/uL (ref 150–450)
RBC: 4.8 x10E6/uL (ref 3.77–5.28)
RDW: 14.5 % (ref 11.7–15.4)
WBC: 5.6 10*3/uL (ref 3.4–10.8)

## 2021-05-06 LAB — BMP8+EGFR
BUN/Creatinine Ratio: 14 (ref 9–23)
BUN: 16 mg/dL (ref 6–24)
CO2: 19 mmol/L — ABNORMAL LOW (ref 20–29)
Calcium: 9.2 mg/dL (ref 8.7–10.2)
Chloride: 104 mmol/L (ref 96–106)
Creatinine, Ser: 1.17 mg/dL — ABNORMAL HIGH (ref 0.57–1.00)
Glucose: 79 mg/dL (ref 70–99)
Potassium: 4.8 mmol/L (ref 3.5–5.2)
Sodium: 142 mmol/L (ref 134–144)
eGFR: 55 mL/min/{1.73_m2} — ABNORMAL LOW (ref >59)

## 2021-05-06 LAB — THYROID PANEL WITH TSH
Free Thyroxine Index: 2.3 (ref 1.2–4.9)
T3 Uptake Ratio: 28 % (ref 24–39)
T4, Total: 8.3 ug/dL (ref 4.5–12.0)
TSH: 1.25 u[IU]/mL (ref 0.450–4.500)

## 2021-05-06 LAB — MAGNESIUM: Magnesium: 2.2 mg/dL (ref 1.6–2.3)

## 2021-05-06 LAB — TROPONIN T: Troponin T (Highly Sensitive): <6 ng/L (ref 0–14)

## 2021-05-18 ENCOUNTER — Other Ambulatory Visit (HOSPITAL_COMMUNITY): Payer: Self-pay

## 2021-05-18 MED ORDER — MOUNJARO 5 MG/0.5ML ~~LOC~~ SOAJ
SUBCUTANEOUS | 0 refills | Status: DC
  Filled 2021-05-18: qty 2, 28d supply, fill #0

## 2021-05-22 ENCOUNTER — Other Ambulatory Visit (HOSPITAL_COMMUNITY): Payer: Self-pay

## 2021-05-25 ENCOUNTER — Ambulatory Visit (INDEPENDENT_AMBULATORY_CARE_PROVIDER_SITE_OTHER): Payer: 59 | Admitting: Psychiatry

## 2021-05-25 ENCOUNTER — Other Ambulatory Visit: Payer: Self-pay

## 2021-05-25 DIAGNOSIS — F339 Major depressive disorder, recurrent, unspecified: Secondary | ICD-10-CM
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
## 2021-05-29 ENCOUNTER — Other Ambulatory Visit (HOSPITAL_COMMUNITY): Payer: Self-pay

## 2021-05-30 ENCOUNTER — Other Ambulatory Visit: Payer: Self-pay

## 2021-05-30 ENCOUNTER — Emergency Department (HOSPITAL_BASED_OUTPATIENT_CLINIC_OR_DEPARTMENT_OTHER): Payer: 59 | Admitting: Radiology

## 2021-05-30 ENCOUNTER — Encounter (HOSPITAL_BASED_OUTPATIENT_CLINIC_OR_DEPARTMENT_OTHER): Payer: Self-pay | Admitting: Emergency Medicine

## 2021-05-30 ENCOUNTER — Emergency Department (HOSPITAL_BASED_OUTPATIENT_CLINIC_OR_DEPARTMENT_OTHER)
Admission: EM | Admit: 2021-05-30 | Discharge: 2021-05-30 | Disposition: A | Discharge status: Home / Self Care | Payer: 59 | Attending: Emergency Medicine | Admitting: Emergency Medicine

## 2021-05-30 ENCOUNTER — Other Ambulatory Visit (HOSPITAL_BASED_OUTPATIENT_CLINIC_OR_DEPARTMENT_OTHER): Payer: Self-pay

## 2021-05-30 DIAGNOSIS — R002 Palpitations: Secondary | ICD-10-CM | POA: Insufficient documentation

## 2021-05-30 DIAGNOSIS — R42 Dizziness and giddiness: Secondary | ICD-10-CM | POA: Diagnosis not present

## 2021-05-30 DIAGNOSIS — R55 Syncope and collapse: Secondary | ICD-10-CM | POA: Insufficient documentation

## 2021-05-30 LAB — TROPONIN I (HIGH SENSITIVITY)
Troponin I (High Sensitivity): <2 ng/L (ref <18)
Troponin I (High Sensitivity): <2 ng/L (ref <18)

## 2021-05-30 LAB — URINALYSIS, ROUTINE W REFLEX MICROSCOPIC
Bilirubin Urine: NEGATIVE
Glucose, UA: NEGATIVE mg/dL
Ketones, ur: NEGATIVE mg/dL
Nitrite: NEGATIVE
Specific Gravity, Urine: 1.023 (ref 1.005–1.030)
pH: 6 (ref 5.0–8.0)

## 2021-05-30 LAB — BASIC METABOLIC PANEL
Anion gap: 6 (ref 5–15)
BUN: 18 mg/dL (ref 6–20)
CO2: 28 mmol/L (ref 22–32)
Calcium: 9.3 mg/dL (ref 8.9–10.3)
Chloride: 108 mmol/L (ref 98–111)
Creatinine, Ser: 1.05 mg/dL — ABNORMAL HIGH (ref 0.44–1.00)
GFR, Estimated: >60 mL/min (ref >60)
Glucose, Bld: 95 mg/dL (ref 70–99)
Potassium: 3.9 mmol/L (ref 3.5–5.1)
Sodium: 142 mmol/L (ref 135–145)

## 2021-05-30 LAB — CBC
HCT: 40.3 % (ref 36.0–46.0)
Hemoglobin: 13.1 g/dL (ref 12.0–15.0)
MCH: 26.5 pg (ref 26.0–34.0)
MCHC: 32.5 g/dL (ref 30.0–36.0)
MCV: 81.4 fL (ref 80.0–100.0)
Platelets: 275 10*3/uL (ref 150–400)
RBC: 4.95 MIL/uL (ref 3.87–5.11)
RDW: 14 % (ref 11.5–15.5)
WBC: 5.2 10*3/uL (ref 4.0–10.5)
nRBC: 0 % (ref 0.0–0.2)

## 2021-05-30 LAB — MAGNESIUM: Magnesium: 2.2 mg/dL (ref 1.7–2.4)

## 2021-05-30 LAB — HEPATIC FUNCTION PANEL
ALT: 6 U/L (ref 0–44)
AST: 10 U/L — ABNORMAL LOW (ref 15–41)
Albumin: 4.2 g/dL (ref 3.5–5.0)
Alkaline Phosphatase: 49 U/L (ref 38–126)
Bilirubin, Direct: <0.1 mg/dL (ref 0.0–0.2)
Total Bilirubin: 0.3 mg/dL (ref 0.3–1.2)
Total Protein: 6.8 g/dL (ref 6.5–8.1)

## 2021-05-30 LAB — PREGNANCY, URINE: Preg Test, Ur: NEGATIVE

## 2021-05-30 LAB — TSH: TSH: 1.481 u[IU]/mL (ref 0.350–4.500)

## 2021-05-30 NOTE — ED Provider Notes (Signed)
[facility] EMERGENCY DEPT Provider Note   CSN: [id number] Arrival date & time: 05/30/21  1309     History  Chief Complaint  Patient presents with   Palpitations   Fall   Dizziness    Aimee Hart is a 56 y.o. 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  Patient has been having intermittent palpitations over the last month where she feels like her heart is pounding and intermittently beating quickly.  She was seen by her PCP with reassuring labs including normal TSH and troponin as well as EKG a few weeks ago.  She states that this morning she woke up after her night shift around 11:00; she did notice palpitations when she first woke.  When she ambulated to the kitchen to make breakfast she states that the pounding in her chest intensified, she got tunnel vision, and syncopized falling and hitting her head on the corner of the stove.  She states she did have something in the microwave, and the microwave was still running when she regained consciousness.   Patient without history of dysrhythmia but does have history of generalized anxiety disorder, GERD, OSA on CPAP, hypercholesterolemia.  She is not anticoagulated.  She was told to see a cardiologist by her primary care doctor but she said she was feeling better and so she has not called to make an appointment.  HPI     Home Medications Prior to Admission medications   Medication Sig Start Date End Date Taking? Authorizing Provider  ALPRAZolam Duanne Moron) 0.5 MG tablet Take 1 tablet (0.5 mg total) by mouth 2 (two) times daily as needed for anxiety. 02/14/21  Yes Donnal Moat T, PA-C  Methylfol-Algae-B12-Acetylcyst (CEREFOLIN NAC) 6-90.314-2-600 MG TABS Take 1 capsule by mouth daily. 03/28/21  Yes Donnal Moat T, PA-C  omeprazole (PRILOSEC) 10 MG capsule Take 10 mg by mouth daily.   Yes [provider]  tirzepatide  Darcel Bayley) 5 MG/0.5ML Pen Inject 0.5 mLs into the skin once weekly. 05/15/21  Yes   Vilazodone HCl (VIIBRYD) 40 MG TABS Take 1 tablet by mouth daily. 05/02/21  Yes Hurst, Helene Kelp T, PA-C  zolpidem (AMBIEN) 10 MG tablet Take 1/2 - 1 tablet by mouth at bedtime as needed for sleep. 02/14/21 05/30/21 Yes Hurst, Dorothea Glassman, PA-C  ibuprofen (ADVIL,MOTRIN) 200 MG tablet Take 600 mg by mouth every 6 (six) hours as needed for headache, mild pain or moderate pain.    [provider]      Allergies    Patient has no known allergies.    Review of Systems   Review of Systems  Constitutional: Negative.   HENT: Negative.    Eyes: Negative.   Respiratory: Negative.  Negative for cough and chest tightness.   Cardiovascular:  Positive for palpitations. Negative for chest pain and leg swelling.  Gastrointestinal: Negative.   Genitourinary: Negative.   Musculoskeletal: Negative.   Neurological:  Positive for syncope and light-headedness.  Hematological: Negative.    Physical Exam Updated Vital Signs BP 129/89    Pulse 85    Temp 98.7 F (37.1 C)    Resp 15    Ht 5' 5.5" (1.664 m)    Wt 89.4 kg    SpO2 98%    BMI 32.28 kg/m  Physical Exam Vitals and nursing note reviewed.  Constitutional:      Appearance: She is not ill-appearing or toxic-appearing.  HENT:  Head: Normocephalic and atraumatic.     Nose: Nose normal.     Mouth/Throat:     Mouth: Mucous membranes are moist.     Pharynx: No oropharyngeal exudate or posterior oropharyngeal erythema.  Eyes:     General:        Right eye: No discharge.        Left eye: No discharge.     Extraocular Movements: Extraocular movements intact.     Conjunctiva/sclera: Conjunctivae normal.     Pupils: Pupils are equal, round, and reactive to light.  Cardiovascular:     Rate and Rhythm: Normal rate and regular rhythm.     Pulses: Normal pulses.     Heart sounds: Normal heart sounds. No murmur heard. Pulmonary:     Effort: Pulmonary effort is  normal. No tachypnea, bradypnea, accessory muscle usage, prolonged expiration or respiratory distress.     Breath sounds: Normal breath sounds. No wheezing or rales.  Chest:     Chest wall: No mass, lacerations, deformity, swelling, tenderness, crepitus or edema.  Abdominal:     General: Bowel sounds are normal. There is no distension.     Palpations: Abdomen is soft.     Tenderness: There is no abdominal tenderness. There is no right CVA tenderness, left CVA tenderness, guarding or rebound.  Musculoskeletal:        General: No deformity.     Cervical back: Neck supple. No tenderness.     Right lower leg: No edema.     Left lower leg: No edema.  Lymphadenopathy:     Cervical: No cervical adenopathy.  Skin:    General: Skin is warm and dry.     Capillary Refill: Capillary refill takes less than 2 seconds.  Neurological:     General: No focal deficit present.     Mental Status: She is alert and oriented to person, place, and time. Mental status is at baseline.  Psychiatric:        Mood and Affect: Mood normal.     ED Results / Procedures / Treatments   Labs (all labs ordered are listed, but only abnormal results are displayed) Labs Reviewed  BASIC METABOLIC PANEL - Abnormal; Notable for the following components:      Result Value   Creatinine, Ser 1.05 (*)    All other components within normal limits  URINALYSIS, ROUTINE W REFLEX MICROSCOPIC - Abnormal; Notable for the following components:   Hgb urine dipstick TRACE (*)    Protein, ur TRACE (*)    Leukocytes,Ua SMALL (*)    All other components within normal limits  CBC  PREGNANCY, URINE  TSH  MAGNESIUM  HEPATIC FUNCTION PANEL  PROTIME-INR  TROPONIN I (HIGH SENSITIVITY)    EKG None  Radiology DG Chest 2 View  Result Date: 05/30/2021 CLINICAL DATA:  Near syncope in a 56 year old female. EXAM: CHEST - 2 VIEW COMPARISON:  Chest x-ray from January 14, 2018. FINDINGS: Trachea is midline. Cardiomediastinal contours and  hilar structures are stable. Lungs are clear aside from linear opacity at the LEFT lung base likely scarring or atelectasis. No visible pneumothorax. EKG leads project over the chest an a gastric band projects over the upper abdomen. No sign of pleural effusion. On limited assessment there is no acute skeletal finding. IMPRESSION: No active cardiopulmonary disease. Electronically Signed   By: Zetta Bills M.D.   On: 05/30/2021 15:13    Procedures Procedures    Medications Ordered in ED Medications - No data to display  ED Course/ Medical Decision Making/ A&P                           Medical Decision Making 56 year old female presents with concern for syncopal episode with palpitations today.  The differential for syncope is extensive and includes, but is not limited to: arrythmia (Vtach, SVT, SSS, sinus arrest, AV block, bradycardia), aortic stenosis, AMI, hypertrophic obstructive cardiomyopathy (HOCM), PE, atrial myxoma, pulmonary hypertension, orthostatic hypotension, hypovolemia, drug effect, GB syndrome, micturition, cough, carotid sinus sensitivity, seizure, TIA/CVA, hypoglycemia, vertigo.  Mildly hypertensive on intake, vital signs otherwise normal.  Cardiopulmonary abdominal exams are benign.  Patient without focal deficit on neurologic exam.  Amount and/or Complexity of Data Reviewed Labs: ordered.    Details: CBC, BMP with improved creatinine to 1.  05 from 1.1 prior.  TSH normal 1.4, troponin <2, mag normal, hepatic function panel unremarkable. Radiology: ordered.    Details: Chest x-ray negative for acute cardiopulmonary disease. ECG/medicine tests:     Details: Sinus rhythm, no STEMI. Discussion of management or test interpretation with external provider(s): Consult to cardiologist, Dr. Stanford Breed, agrees that work-up in the emergency department is very reassuring up to this point.  Etiology less likely to be cardiac given prodrome of symptoms.  He recommends follow-up in the  outpatient setting but remains available for consult should this ED provider feel more comfortable admitting patient.  Appreciate collaboration of care with patient.  Extensive discussion with the patient regarding disposition planning.  Overall clinical picture is less consistent with a cardiac etiology of symptoms given prodrome prior to syncope.  Additionally [place] syncope risk score of -2, associated with <2% risk of serious adverse event at 30 days.  Patient had normal sinus rhythm on the cardiac monitor throughout her 4 and half hour stay in the emergency department.  Cherl voiced understanding of her medical evaluation and treatment plan thus far.  She is amenable plan for discharge home with close outpatient cardiology follow-up and would like to avoid admission at this time.  Clinical suspicion is exceedingly low for underlying emergent condition that would require any further work-up in the emergency department or management in the inpatient setting.  Return precautions were given.  Patient is well-appearing, stable, and is discharged in good condition.  This chart was dictated using voice recognition software, Dragon. Despite the best efforts of this provider to proofread and correct errors, errors may still occur which can change documentation meaning.           Final Clinical Impression(s) / ED Diagnoses Final diagnoses:  None    Rx / DC Orders ED Discharge Orders     None         Aura Dials 05/31/21 1312    Sherwood Gambler, MD 06/01/21 [id number]

## 2021-05-30 NOTE — ED Notes (Signed)
°   05/30/21 1710 05/30/21 1711 05/30/21 1713  Vitals  BP 110/86 125/76 111/80  BP Location Right Arm Right Arm Right Arm  BP Method Automatic Automatic Automatic  Patient Position (if appropriate) Lying Sitting Standing  Pulse Rate Source Monitor Monitor Monitor  ECG Heart Rate 77 85 100    Orthostatic VS shown above, pt tolerated well. Denies dizziness upon changing positions.

## 2021-05-30 NOTE — ED Notes (Addendum)
Pt reports palpitations and dizziness prior to arrival. States that she had a near syncopal episode and hit forehead on stove. Denies LOC. Denies chest pain. No other symptoms present at this time.

## 2021-05-30 NOTE — Discharge Instructions (Signed)
You are seen in the ER today after your syncopal episode.  Your physical exam, blood work, EKG, and chest x-ray were very reassuring today.  You did show evidence of orthostasis, likely you are a bit dehydrated.  While the exact cause of your syncopal episode today remains unclear suspect it may have been related to this.  You should still follow-up with the cardiologist Dr. Stanford Breed listed below.  Please call his office first thing tomorrow to schedule a follow-up appointment for palpitations with syncope.  Increase your hydration over the next several days and follow-up with your primary care doctor.  Return to the ER if you develop any severe symptoms.

## 2021-05-30 NOTE — ED Triage Notes (Signed)
Pt arrives to ED with c/o palpitations. This has been recurrent for over a month. She was seen by her family practice for same. Today pt was at home experiencing palpations and suddenly became dizzy causing her to fall and hit her head on a stove. Pt denies LOC, neck pain.

## 2021-05-31 ENCOUNTER — Other Ambulatory Visit (HOSPITAL_BASED_OUTPATIENT_CLINIC_OR_DEPARTMENT_OTHER): Payer: Self-pay

## 2021-05-31 ENCOUNTER — Other Ambulatory Visit (HOSPITAL_COMMUNITY): Payer: Self-pay

## 2021-06-02 ENCOUNTER — Encounter: Payer: Self-pay | Admitting: Internal Medicine

## 2021-06-02 ENCOUNTER — Other Ambulatory Visit: Payer: Self-pay

## 2021-06-02 ENCOUNTER — Ambulatory Visit: Payer: 59 | Admitting: Internal Medicine

## 2021-06-02 VITALS — BP 118/76 | HR 82 | Resp 20 | Ht 65.0 in | Wt 201.6 lb

## 2021-06-02 DIAGNOSIS — R002 Palpitations: Secondary | ICD-10-CM

## 2021-06-02 NOTE — Patient Instructions (Signed)

## 2021-06-02 NOTE — Progress Notes (Signed)
Cardiology Office Note:    Date:  06/02/2021   ID:  Aimee Hart, DOB February 07, 1966, MRN [id number]  PCP:  Baruch Gouty, FNP   [facility] Providers Cardiologist:  None     Referring MD: Baruch Gouty, FNP   No chief complaint on file. Palpitations  History of Present Illness:    Aimee Hart is a 56 y.o. female with a hx of depression, GERD, OSA, who presents for palpitations that led to a syncopal episode  Aimee Hart was seen in the ED. She had TSH and troponin that were unremarkable. EKG was unremarkable. With symptoms of palpitations she was recommended to see cardiology. Prior to her syncopal episode she noted feeling chest pounding, tunnel vision and then syncopizing.  She notes that her symptoms have been going on for a month. She continues to have palpitations. She works night shift and she notes them more when she goes to lay down. She notes that she does not hydrate. She drinks 3 ,16 ounce of diet sodas.  She started drinking more water and that has helped. She is going through menopause. She has a hx of OSA and has a cpap. Her grandfather and father have hx of heart disease. Her grandfather was in his mid 17s. Her father was in his 24s. No hx of diabetes. She smoked over 20 years ago, for 10 years. She has not seen a cardiologist before. She denies chest pain or shortness of breath.   Past Medical History:  Diagnosis Date   Depression    GERD (gastroesophageal reflux disease)    Hypercholesteremia    Sleep apnea     Past Surgical History:  Procedure Laterality Date   LAPAROSCOPIC ROUX-EN-Y GASTRIC BYPASS WITH UPPER ENDOSCOPY AND REMOVAL OF LAP BAND  2008   TONSILLECTOMY  1980    Current Medications: No outpatient medications have been marked as taking for the 06/02/21 encounter (Appointment) with Janina Mayo, MD.     Allergies:   Patient has no known allergies.   Social History   Socioeconomic History   Marital status: Divorced    Spouse name:  Not on file   Number of children: 2   Years of education: college   Highest education level: Not on file  Occupational History    Comment: RN  Tobacco Use   Smoking status: Former    Packs/day: 1.00    Years: 10.00    Pack years: 10.00    Types: Cigarettes    Quit date: 02/07/1996    Years since quitting: 25.3   Smokeless tobacco: Never  Vaping Use   Vaping Use: Never used  Substance and Sexual Activity   Alcohol use: Yes    Comment: 1/week   Drug use: Never   Sexual activity: Yes  Other Topics Concern   Not on file  Social History Narrative   Lives with son   Caffeine 16 oz daily   Social Determinants of Health   Financial Resource Strain: Not on file  Food Insecurity: Not on file  Transportation Needs: Not on file  Physical Activity: Not on file  Stress: Not on file  Social Connections: Not on file     Family History: The patient's family history includes Alzheimer's disease in her paternal grandmother; Cancer in her maternal grandmother and sister; Depression in her mother; Emphysema in her maternal grandfather; Heart disease in her father and paternal grandfather; Hypertension in her father and mother; Kidney disease in her daughter; Melanoma in her  mother; Post-traumatic stress disorder in her brother; Thyroid disease in her father and mother.  ROS:   Please see the history of present illness.     All other systems reviewed and are negative.  EKGs/Labs/Other Studies Reviewed:    The following studies were reviewed today:   EKG:  EKG is  ordered today.  The ekg ordered today demonstrates   NSR  Recent Labs: 05/30/2021: ALT 6; BUN 18; Creatinine, Ser 1.05; Hemoglobin 13.1; Magnesium 2.2; Platelets 275; Potassium 3.9; Sodium 142; TSH 1.481  Recent Lipid Panel    Component Value Date/Time   CHOL 195 12/25/2018 1224   TRIG 109 12/25/2018 1224   HDL 57 12/25/2018 1224   CHOLHDL 3.4 12/25/2018 1224   LDLCALC 119 (H) 12/25/2018 1224     Risk  Assessment/Calculations:           Physical Exam:    VS:  Vitals:   06/02/21 0814  BP: 118/76  Pulse: 82  Resp: 20  SpO2: 98%     Wt Readings from Last 3 Encounters:  05/30/21 197 lb (89.4 kg)  05/05/21 199 lb (90.3 kg)  12/29/20 204 lb 2 oz (92.6 kg)     GEN:  Well nourished, well developed in no acute distress HEENT: Normal NECK: No JVD; No carotid bruits LYMPHATICS: No lymphadenopathy CARDIAC: RRR, no murmurs, rubs, gallops RESPIRATORY:  Clear to auscultation without rales, wheezing or rhonchi  ABDOMEN: Soft, non-tender, non-distended MUSCULOSKELETAL:  No edema; No deformity  SKIN: Warm and dry NEUROLOGIC:  Alert and oriented x 3 PSYCHIATRIC:  Normal affect   ASSESSMENT:   #Syncope: Symptoms are consistent with vasovagal syncope. She has had improvement in her symptoms of palpitations with hydration and reducing caffeine.  We discussed a monitor and agreed that we can hold off unless symptoms return.  PLAN:    In order of problems listed above:  Follow up PRN        Medication Adjustments/Labs and Tests Ordered: Current medicines are reviewed at length with the patient today.  Concerns regarding medicines are outlined above.  No orders of the defined types were placed in this encounter.  No orders of the defined types were placed in this encounter.   There are no Patient Instructions on file for this visit.   Signed, Janina Mayo, MD  06/02/2021 8:08 AM     [facility]

## 2021-06-07 ENCOUNTER — Ambulatory Visit: Payer: 59 | Admitting: Psychiatry

## 2021-06-12 DIAGNOSIS — D485 Neoplasm of uncertain behavior of skin: Secondary | ICD-10-CM | POA: Diagnosis not present

## 2021-06-12 DIAGNOSIS — L821 Other seborrheic keratosis: Secondary | ICD-10-CM | POA: Diagnosis not present

## 2021-06-12 DIAGNOSIS — L718 Other rosacea: Secondary | ICD-10-CM | POA: Diagnosis not present

## 2021-06-12 DIAGNOSIS — D1801 Hemangioma of skin and subcutaneous tissue: Secondary | ICD-10-CM | POA: Diagnosis not present

## 2021-06-13 ENCOUNTER — Ambulatory Visit (INDEPENDENT_AMBULATORY_CARE_PROVIDER_SITE_OTHER): Payer: 59 | Admitting: Physician Assistant

## 2021-06-13 ENCOUNTER — Other Ambulatory Visit (HOSPITAL_BASED_OUTPATIENT_CLINIC_OR_DEPARTMENT_OTHER): Payer: Self-pay

## 2021-06-13 ENCOUNTER — Other Ambulatory Visit: Payer: Self-pay

## 2021-06-13 ENCOUNTER — Encounter: Payer: Self-pay | Admitting: Physician Assistant

## 2021-06-13 DIAGNOSIS — F411 Generalized anxiety disorder: Secondary | ICD-10-CM

## 2021-06-13 DIAGNOSIS — G4726 Circadian rhythm sleep disorder, shift work type: Secondary | ICD-10-CM

## 2021-06-13 DIAGNOSIS — F339 Major depressive disorder, recurrent, unspecified: Secondary | ICD-10-CM

## 2021-06-13 MED ORDER — BUPROPION HCL 75 MG PO TABS
75.0000 mg | ORAL_TABLET | Freq: Two times a day (BID) | ORAL | 1 refills | Status: DC
  Filled 2021-06-13: qty 60, 30d supply, fill #0

## 2021-06-13 NOTE — Progress Notes (Signed)
[facility]  Patient ID: Aimee Hart,  MRN: [id number]  PCP: Baruch Gouty, FNP  Date of Evaluation: 06/13/2021 Time spent:30 minutes  Chief Complaint:  Chief Complaint   Depression; Anxiety; Follow-up      HISTORY/CURRENT STATUS: HPI For routine med check.  At the last visit 6 weeks ago we decided to leave the meds the same. She had only been on that dose of Viibryd for a month, and we felt it best to give it more time to work. She feels the same. "Blah. Stuck." Is grateful that she's not feeling as bad as she once did with the depression. But would like for it to be better.  She does not find joy in anything. She is still working without missing nights but that's about all she does. No energy or motivation for anything else. Isolates. ADLs and hygiene are nl. Not crying easily. No SI/HI.  She took Wellbutrin in the past but had SE that may, or may not, have been related to it. On the XL, she vomited a few times, and the pill hadn't digested over several hours. We switched to SR and that made her jittery. So we stopped it. She wonders if re-trying it might be a good idea.   Anxiety is still there, is taking the Xanax a little more frequently, maybe every other day, to help keep the anxiety and irritability at [place]. Sleeps ok.  Patient denies increased energy with decreased need for sleep, no increased talkativeness, no racing thoughts, no impulsivity or risky behaviors, no increased spending, no increased libido, no grandiosity, no increased irritability or anger, and no hallucinations.  Denies dizziness, syncope, seizures, numbness, tingling, tremor, tics, unsteady gait, slurred speech, confusion. Denies muscle or joint pain, stiffness, or dystonia.  Individual Medical History/ Review of Systems: Changes? :Yes   went to ER after having palpitations, passed out, hit her head, dx with dehydration. Has seen a cardiologist since.  Is trying to drink more water and the  palpitations aren't as bad.   Past medications for mental health diagnoses include: Zoloft, Prozac, Paxil, Abilify, Effexor, Celexa, Cymbalta, Lamictal, Ambien, melatonin, Wellbutrin SR, Trintellix, Viibryd  Allergies: Patient has no known allergies.  Current Medications:  Current Outpatient Medications:    ALPRAZolam (XANAX) 0.5 MG tablet, Take 1 tablet (0.5 mg total) by mouth 2 (two) times daily as needed for anxiety., Disp: 30 tablet, Rfl: 5   buPROPion (WELLBUTRIN) 75 MG tablet, Take 1 tablet (75 mg total) by mouth 2 (two) times daily., Disp: 60 tablet, Rfl: 1   ibuprofen (ADVIL,MOTRIN) 200 MG tablet, Take 600 mg by mouth every 6 (six) hours as needed for headache, mild pain or moderate pain., Disp: , Rfl:    Methylfol-Algae-B12-Acetylcyst (CEREFOLIN NAC) 6-90.314-2-600 MG TABS, Take 1 capsule by mouth daily., Disp: 30 tablet, Rfl: 11   omeprazole (PRILOSEC) 10 MG capsule, Take 10 mg by mouth daily., Disp: , Rfl:    Vilazodone HCl (VIIBRYD) 40 MG TABS, Take 1 tablet by mouth daily., Disp: 30 tablet, Rfl: 1   tirzepatide (MOUNJARO) 5 MG/0.5ML Pen, Inject 0.5 mLs into the skin once weekly. (Patient not taking: Reported on 06/13/2021), Disp: 2 mL, Rfl: 0   zolpidem (AMBIEN) 10 MG tablet, Take 1/2 - 1 tablet by mouth at bedtime as needed for sleep., Disp: 30 tablet, Rfl: 5 Medication Side Effects: none  Family Medical/ Social History: Changes? No   MENTAL HEALTH EXAM:  There were no vitals taken for this visit.There is no  height or weight on file to calculate BMI.  General Appearance: Casual, Neat and Well Groomed  Eye Contact:  Good  Speech:  Clear and Coherent and Normal Rate  Volume:  Normal  Mood:  Depressed  Affect:  Congruent  Thought Process:  Goal Directed and Descriptions of Associations: Intact  Orientation:  Full (Time, Place, and Person)  Thought Content: Logical   Suicidal Thoughts:  No  Homicidal Thoughts:  No  Memory:  WNL  Judgement:  Good  Insight:  Good   Psychomotor Activity:  Normal  Concentration:  Concentration: Good and Attention Span: Good  Recall:  Good  Fund of Knowledge: Good  Language: Good  Assets:  Desire for Improvement Financial Resources/Insurance Housing Resilience Transportation Vocational/Educational  ADL's:  Intact  Cognition: WNL  Prognosis:  Good   GeneSight results on chart, scanned under labs.   Test results were reviewed again today 06/13/2021   DIAGNOSES:    ICD-10-CM   1. 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  Ambien filled 05/03/2021.  Xanax filled 05/03/2021. I provided 30 minutes of face to face time during this encounter, including time spent before and after the visit in records review, medical decision making, counseling pertinent to today's visit, and charting.  We had a long disc about options. We have talked about TMS, Spravato, ECT in the past and she doesn't want to do ECT for sure and hopes not to need one of the others. She's never taken an MAOI. She might have taken a TCA long ago, but doesn't remember for sure. We definitely could consider Elavil or one of the others. She will do some research on them. Also discussed re-trying Wellbutrin. I think it's a good idea, will start w/ IR, lowest dose and increase if it seems to be helping slightly and she tolerates well. She is comfortable w/ this plan.  Continue Xanax 0.5 mg, 1 p.o. twice daily as needed. Start Wellbutrin IR 75 mg, 1 q a.m. for 2 weeks, then can increase to 1 at breakfast and 1 in early afternoon if she would like to. It's ok to stay at 1 in the morning though.  Continue Cerefolin NAC 1 p.o. daily.   Continue Viibryd 40 mg p.o. daily.   Continue Ambien 10 mg, 1/2-1 nightly as needed sleep. Refer to Rinaldo Cloud, LCSW here in our  office. Return in 4-6 weeks.  Donnal Moat, PA-C

## 2021-06-14 ENCOUNTER — Ambulatory Visit: Payer: 59 | Admitting: Psychiatry

## 2021-06-16 ENCOUNTER — Other Ambulatory Visit (HOSPITAL_BASED_OUTPATIENT_CLINIC_OR_DEPARTMENT_OTHER): Payer: Self-pay

## 2021-06-16 ENCOUNTER — Encounter (HOSPITAL_BASED_OUTPATIENT_CLINIC_OR_DEPARTMENT_OTHER): Payer: Self-pay

## 2021-06-16 MED ORDER — METRONIDAZOLE 0.75 % EX CREA
TOPICAL_CREAM | CUTANEOUS | 1 refills | Status: AC
  Filled 2021-06-16 – 2022-03-27 (×2): qty 45, 30d supply, fill #0

## 2021-06-19 ENCOUNTER — Other Ambulatory Visit (HOSPITAL_BASED_OUTPATIENT_CLINIC_OR_DEPARTMENT_OTHER): Payer: Self-pay

## 2021-06-21 ENCOUNTER — Ambulatory Visit: Payer: 59 | Admitting: Psychiatry

## 2021-06-28 ENCOUNTER — Ambulatory Visit: Payer: 59 | Admitting: Psychiatry

## 2021-07-12 ENCOUNTER — Other Ambulatory Visit (HOSPITAL_BASED_OUTPATIENT_CLINIC_OR_DEPARTMENT_OTHER): Payer: Self-pay

## 2021-07-12 ENCOUNTER — Ambulatory Visit: Payer: 59 | Admitting: Psychiatry

## 2021-07-18 ENCOUNTER — Other Ambulatory Visit (HOSPITAL_BASED_OUTPATIENT_CLINIC_OR_DEPARTMENT_OTHER): Payer: Self-pay

## 2021-07-18 ENCOUNTER — Encounter: Payer: Self-pay | Admitting: Physician Assistant

## 2021-07-18 ENCOUNTER — Ambulatory Visit (INDEPENDENT_AMBULATORY_CARE_PROVIDER_SITE_OTHER): Payer: 59 | Admitting: Physician Assistant

## 2021-07-18 ENCOUNTER — Other Ambulatory Visit: Payer: Self-pay

## 2021-07-18 DIAGNOSIS — G4726 Circadian rhythm sleep disorder, shift work type: Secondary | ICD-10-CM

## 2021-07-18 DIAGNOSIS — F339 Major depressive disorder, recurrent, unspecified: Secondary | ICD-10-CM

## 2021-07-18 DIAGNOSIS — F411 Generalized anxiety disorder: Secondary | ICD-10-CM | POA: Diagnosis not present

## 2021-07-18 MED ORDER — BUPROPION HCL 75 MG PO TABS
150.0000 mg | ORAL_TABLET | Freq: Every morning | ORAL | 1 refills | Status: DC
  Filled 2021-07-18: qty 180, 90d supply, fill #0

## 2021-07-18 NOTE — Progress Notes (Signed)
[facility] ? ?Patient ID: Aimee Hart,  ?MRN: [id number] ? ?PCP: Baruch Gouty, FNP ? ?Date of Evaluation: 07/18/2021 ?Time spent:20 minutes ? ?Chief Complaint:  ?Chief Complaint   ?Depression; Anxiety; Insomnia; Follow-up ?  ? ? ?HISTORY/CURRENT STATUS: ?HPI For routine med check. ? ?We started low-dose short-acting Wellbutrin about 5 weeks ago.  She thinks it is helping a little bit.  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  She was not able to increase to 2 pills as we had initially discussed.  She thinks her energy might be a little bit better, may be more motivated to do things that she has not been in a while.  She is very tired from her work schedule though so it is difficult to say how much of the stimulant effect this is giving her.  She is an introvert and a loaner by nature, that has not changed.  She sleeps fine she does have anxiety but Xanax does help.  Takes it only as needed.  Not having panic attacks so much as a generalized sense of unease.  No suicidal or homicidal thoughts. ? ?Patient denies increased energy with decreased need for sleep, no increased talkativeness, no racing thoughts, no impulsivity or risky behaviors, no increased spending, no increased libido, no grandiosity, no increased irritability or anger, and no hallucinations. ? ?Denies dizziness, syncope, seizures, numbness, tingling, tremor, tics, unsteady gait, slurred speech, confusion. Denies muscle or joint pain, stiffness, or dystonia. ? ?Individual Medical History/ Review of Systems: Changes? :No    ? ?Past medications for mental health diagnoses include: ?Zoloft, Prozac, Paxil, Abilify, Effexor, Celexa, Cymbalta, Lamictal, Ambien, melatonin, Wellbutrin SR, Trintellix, Viibryd ? ?Allergies: Patient has no known allergies. ? ?Current Medications:  ?Current Outpatient Medications:  ?  ALPRAZolam (XANAX) 0.5  MG tablet, Take 1 tablet (0.5 mg total) by mouth 2 (two) times daily as needed for anxiety., Disp: 30 tablet, Rfl: 5 ?  ibuprofen (ADVIL,MOTRIN) 200 MG tablet, Take 600 mg by mouth every 6 (six) hours as needed for headache, mild pain or moderate pain., Disp: , Rfl:  ?  Methylfol-Algae-B12-Acetylcyst (CEREFOLIN NAC) 6-90.314-2-600 MG TABS, Take 1 capsule by mouth daily., Disp: 30 tablet, Rfl: 11 ?  metroNIDAZOLE (METROCREAM) 0.75 % cream, Apply as directed to affected area twice a day; Use of SPF is recommended., Disp: 45 g, Rfl: 1 ?  omeprazole (PRILOSEC) 10 MG capsule, Take 10 mg by mouth daily., Disp: , Rfl:  ?  Vilazodone HCl (VIIBRYD) 40 MG TABS, Take 1 tablet by mouth daily., Disp: 30 tablet, Rfl: 1 ?  buPROPion (WELLBUTRIN) 75 MG tablet, Take 2 tablets (150 mg total) by mouth in the morning., Disp: 180 tablet, Rfl: 1 ?  tirzepatide (MOUNJARO) 5 MG/0.5ML Pen, Inject 0.5 mLs into the skin once weekly. (Patient not taking: Reported on 06/13/2021), Disp: 2 mL, Rfl: 0 ?  zolpidem (AMBIEN) 10 MG tablet, Take 1/2 - 1 tablet by mouth at bedtime as needed for sleep., Disp: 30 tablet, Rfl: 5 ?Medication Side Effects: none ? ?Family Medical/ Social History: Changes? No ?  ?MENTAL HEALTH EXAM: ? ?There were no vitals taken for this visit.There is no height or weight on file to calculate BMI.  ?General Appearance: Casual, Neat and Well Groomed  ?Eye Contact:  Good  ?Speech:  Clear and Coherent and Normal Rate  ?Volume:  Normal  ?  Mood:  Euthymic  ?Affect:  Congruent  ?Thought Process:  Goal Directed and Descriptions of Associations: Intact  ?Orientation:  Full (Time, Place, and Person)  ?Thought Content: Logical   ?Suicidal Thoughts:  No  ?Homicidal Thoughts:  No  ?Memory:  WNL  ?Judgement:  Good  ?Insight:  Good  ?Psychomotor Activity:  Normal  ?Concentration:  Concentration: Good and Attention Span: Good  ?Recall:  Good  ?Fund of Knowledge: Good  ?Language: Good  ?Assets:  Desire for Improvement ?Financial  Resources/Insurance ?Housing ?Resilience ?Transportation ?Vocational/Educational  ?ADL's:  Intact  ?Cognition: WNL  ?Prognosis:  Good  ? ?GeneSight results on chart, scanned under labs.  ? ? ? ?DIAGNOSES:  ?  ICD-10-CM   ?1. Recurrent major depression resistant to treatment [facility])  F33.9   ?  ?2. Generalized anxiety disorder  F41.1   ?  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  ? ? ? ?Receiving Psychotherapy: No    ? ? ?RECOMMENDATIONS:  ?PDMP was reviewed.  Ambien filled 05/03/2021.  Xanax filled 05/03/2021. ?I provided 20  minutes of face to face time during this encounter, including time spent before and after the visit in records review, medical decision making, counseling pertinent to today's visit, and charting.  ?We briefly discussed Spravato.  She may be interested if the Wellbutrin does not help a lot.  Because of her work schedule, night shift 3 nights a week it is difficult to keep the Wellbutrin at the same time every day so that is a factor in the efficacy as well.  We agreed to increase the dose and reassess next month. ? ?Continue Xanax 0.5 mg, 1 p.o. twice daily as needed. ?Increase Wellbutrin 75 mg to 2 p.o. every morning. ?Continue Cerefolin NAC 1 p.o. daily.   ?Continue Viibryd 40 mg p.o. daily.   ?Continue Ambien 10 mg, 1/2-1 nightly as needed sleep. ?Refer to Rinaldo Cloud, LCSW here in our office. ?Return in 4-6 weeks. ? ?Donnal Moat, PA-C  ?

## 2021-07-26 ENCOUNTER — Other Ambulatory Visit (HOSPITAL_BASED_OUTPATIENT_CLINIC_OR_DEPARTMENT_OTHER): Payer: Self-pay

## 2021-08-02 ENCOUNTER — Other Ambulatory Visit (HOSPITAL_BASED_OUTPATIENT_CLINIC_OR_DEPARTMENT_OTHER): Payer: Self-pay

## 2021-08-02 DIAGNOSIS — N951 Menopausal and female climacteric states: Secondary | ICD-10-CM | POA: Diagnosis not present

## 2021-08-02 DIAGNOSIS — Z79890 Hormone replacement therapy (postmenopausal): Secondary | ICD-10-CM | POA: Diagnosis not present

## 2021-08-02 MED ORDER — ESTRADIOL 0.05 MG/24HR TD PTTW
MEDICATED_PATCH | TRANSDERMAL | 11 refills | Status: DC
  Filled 2021-08-02 – 2022-03-27 (×2): qty 8, 28d supply, fill #0

## 2021-08-02 MED ORDER — PROGESTERONE MICRONIZED 100 MG PO CAPS
ORAL_CAPSULE | ORAL | 11 refills | Status: AC
  Filled 2021-08-02 – 2022-03-27 (×2): qty 30, 30d supply, fill #0

## 2021-08-14 ENCOUNTER — Other Ambulatory Visit: Payer: Self-pay | Admitting: Physician Assistant

## 2021-08-15 ENCOUNTER — Other Ambulatory Visit (HOSPITAL_BASED_OUTPATIENT_CLINIC_OR_DEPARTMENT_OTHER): Payer: Self-pay

## 2021-08-15 MED ORDER — VILAZODONE HCL 40 MG PO TABS
40.0000 mg | ORAL_TABLET | Freq: Every day | ORAL | 1 refills | Status: DC
  Filled 2021-08-15: qty 30, 30d supply, fill #0
  Filled 2021-09-12: qty 30, 30d supply, fill #1

## 2021-08-22 ENCOUNTER — Ambulatory Visit (INDEPENDENT_AMBULATORY_CARE_PROVIDER_SITE_OTHER): Payer: 59 | Admitting: Physician Assistant

## 2021-08-22 ENCOUNTER — Other Ambulatory Visit (HOSPITAL_BASED_OUTPATIENT_CLINIC_OR_DEPARTMENT_OTHER): Payer: Self-pay

## 2021-08-22 ENCOUNTER — Encounter: Payer: Self-pay | Admitting: Physician Assistant

## 2021-08-22 DIAGNOSIS — G4726 Circadian rhythm sleep disorder, shift work type: Secondary | ICD-10-CM

## 2021-08-22 DIAGNOSIS — F411 Generalized anxiety disorder: Secondary | ICD-10-CM | POA: Diagnosis not present

## 2021-08-22 DIAGNOSIS — F339 Major depressive disorder, recurrent, unspecified: Secondary | ICD-10-CM | POA: Diagnosis not present

## 2021-08-22 MED ORDER — ZOLPIDEM TARTRATE 10 MG PO TABS
5.0000 mg | ORAL_TABLET | Freq: Every evening | ORAL | 5 refills | Status: DC | PRN
  Filled 2021-08-22: qty 30, 30d supply, fill #0
  Filled 2021-12-25: qty 30, 30d supply, fill #1
  Filled 2022-02-08: qty 30, 30d supply, fill #2

## 2021-08-22 MED ORDER — ALPRAZOLAM 0.5 MG PO TABS
0.5000 mg | ORAL_TABLET | Freq: Two times a day (BID) | ORAL | 5 refills | Status: DC | PRN
  Filled 2021-08-22: qty 30, 15d supply, fill #0
  Filled 2021-12-25: qty 30, 15d supply, fill #1

## 2021-08-22 NOTE — Progress Notes (Signed)
[facility] ? ?Patient ID: Aimee Hart,  ?MRN: [id number] ? ?PCP: Baruch Gouty, FNP ? ?Date of Evaluation: 08/22/2021 ?Time spent:40 minutes ? ?Chief Complaint:  ?Chief Complaint   ?Depression; Anxiety; Follow-up ?  ? ? ?HISTORY/CURRENT STATUS: ?HPI For routine med check. ? ?We increased the Wellbutrin at [place]. Is about 40% better! Has a little more motivation, takes more initiative to do things.  Feels that the Wellbutrin is working but would still like to feel even better.  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  Looking forward to that change hoping to sleep better.  ADLs and personal hygiene are normal.  Appetite is normal and weight is stable.  Denies suicidal or homicidal thoughts. ? ?Still has anxiety but the Xanax helps.  Most of the time when she needs it it is for an overwhelming sense of unease, like something bad is going to happen.  Not having panic attacks.  Getting good rest is sometimes hard b/c works night shift 3 days per week.  ? ?Patient denies increased energy with decreased need for sleep, no increased talkativeness, no racing thoughts, no impulsivity or risky behaviors, no increased spending, no increased libido, no grandiosity, no increased irritability or anger, no paranoia, and no hallucinations. ? ?Denies dizziness, syncope, seizures, numbness, tingling, tremor, tics, unsteady gait, slurred speech, confusion. Denies muscle or joint pain, stiffness, or dystonia. ? ?Individual Medical History/ Review of Systems: Changes? :No    ? ?Past medications for mental health diagnoses include: ?Zoloft, Prozac, Paxil, Abilify, Effexor, Celexa, Cymbalta, Lamictal, Ambien, melatonin, Wellbutrin SR, Trintellix, Viibryd ? ?Allergies: Patient has no known allergies. ? ?Current Medications:  ?Current Outpatient Medications:  ?  buPROPion (WELLBUTRIN) 75 MG tablet, Take 2 tablets (150 mg  total) by mouth in the morning., Disp: 180 tablet, Rfl: 1 ?  Dextromethorphan HBr 15 MG CAPS, Take 3 capsules (45 mg total) by mouth 2 (two) times daily., Disp: , Rfl: 0 ?  estradiol (VIVELLE-DOT) 0.05 MG/24HR patch, Apply 1 patch twice a week by transdermal route., Disp: 8 patch, Rfl: 11 ?  ibuprofen (ADVIL,MOTRIN) 200 MG tablet, Take 600 mg by mouth every 6 (six) hours as needed for headache, mild pain or moderate pain., Disp: , Rfl:  ?  Methylfol-Algae-B12-Acetylcyst (CEREFOLIN NAC) 6-90.314-2-600 MG TABS, Take 1 capsule by mouth daily., Disp: 30 tablet, Rfl: 11 ?  metroNIDAZOLE (METROCREAM) 0.75 % cream, Apply as directed to affected area twice a day; Use of SPF is recommended., Disp: 45 g, Rfl: 1 ?  omeprazole (PRILOSEC) 10 MG capsule, Take 10 mg by mouth daily., Disp: , Rfl:  ?  progesterone (PROMETRIUM) 100 MG capsule, Take 1 capsule every day by oral route at bedtime., Disp: 30 capsule, Rfl: 11 ?  Vilazodone HCl (VIIBRYD) 40 MG TABS, Take 1 tablet by mouth daily., Disp: 30 tablet, Rfl: 1 ?  ALPRAZolam (XANAX) 0.5 MG tablet, Take 1 tablet (0.5 mg total) by mouth 2 (two) times daily as needed for anxiety., Disp: 30 tablet, Rfl: 5 ?  tirzepatide (MOUNJARO) 5 MG/0.5ML Pen, Inject 0.5 mLs into the skin once weekly. (Patient not taking: Reported on 06/13/2021), Disp: 2 mL, Rfl: 0 ?  zolpidem (AMBIEN) 10 MG tablet, Take 1/2 - 1 tablet by mouth at bedtime as needed for sleep., Disp: 30 tablet, Rfl: 5 ?Medication Side Effects: none ? ?Family Medical/ Social History: Changes? New job, in June  will be in different dept and will be working weekdays  ?  ?MENTAL HEALTH EXAM: ? ?There were no vitals taken for this visit.There is no height or weight on file to calculate BMI.  ?General Appearance: Casual, Neat and Well Groomed  ?Eye Contact:  Good  ?Speech:  Clear and Coherent and Normal Rate  ?Volume:  Normal  ?Mood:  Euthymic  ?Affect:  Congruent  ?Thought Process:  Goal Directed and Descriptions of Associations:  Circumstantial  ?Orientation:  Full (Time, Place, and Person)  ?Thought Content: Logical   ?Suicidal Thoughts:  No  ?Homicidal Thoughts:  No  ?Memory:  WNL  ?Judgement:  Good  ?Insight:  Good  ?Psychomotor Activity:  Normal  ?Concentration:  Concentration: Good and Attention Span: Good  ?Recall:  Good  ?Fund of Knowledge: Good  ?Language: Good  ?Assets:  Desire for Improvement ?Financial Resources/Insurance ?Housing ?Resilience ?Transportation ?Vocational/Educational  ?ADL's:  Intact  ?Cognition: WNL  ?Prognosis:  Good  ? ?GeneSight results on chart, scanned under labs.  ? ?ECT-MADRS   ? ?[facility] Office Visit from 08/22/2021 in [place]  ?MADRS Total Score 36  ? ?  ? ?GAD-7   ? ?[facility] Office Visit from 05/05/2021 in [place] Visit from 12/29/2020 in [facility] Office Visit from 05/14/2018 in [place]  ?Total GAD-7 Score 1 1 0  ? ?  ? ?PHQ2-9   ? ?[facility] Office Visit from 05/05/2021 in [place] Visit from 12/29/2020 in [place] Visit from 12/25/2018 in [facility] Visit from 05/14/2018 in [place] Visit from 05/08/2018 in [place]  ?PHQ-2 Total Score 2 1 0 0 0  ?PHQ-9 Total Score 4 3 -- 0 --  ? ?  ? ?[facility] from 05/30/2021 in [facility]  ?C-SSRS RISK CATEGORY No Risk  ? ?  ? ? ? ? ?DIAGNOSES:  ?  ICD-10-CM   ?1. Recurrent major depression resistant to treatment [facility])  F33.9   ?  ?2. Generalized anxiety disorder  F41.1   ?  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  ? ? ?Receiving Psychotherapy: Yes    ? ? ?RECOMMENDATIONS:  ?PDMP was reviewed.  Ambien filled 07/26/2021.  Xanax filled 07/26/2021. ?I provided 40  minutes of face to face time during this encounter, including time spent before and after the visit in records  review, medical decision making, counseling pertinent to today's visit, and charting.  ?I am glad she is doing some better!  We discussed other options which include increasing the Wellbutrin.  Also discussed the new medication Auvelity which has Wellbutrin plus dextromethorphan in it.  We agree for her to add dextromethorphan to the current dose of Wellbutrin and if she is not feeling any better within a week then she will call and we can increase the Wellbutrin. ?At the last visit we had discussed Spravato.  She is really interested in pursuing that.  I again discussed the benefits, risks, side effects.  She understands the schedule of the treatment and the need for her to be here for at least 2 hours and will need a driver to take her home.  I have discussed this with Larina Earthly, office manager and she will start the process for approval of Spravato.   ?Consider weaning off of Viibryd once Wellbutrin is effective and or Spravato  in place.  She may not need the Viibryd. ? ?Start dextromethorphan 45 mg over the counter, p.o. twice daily.  If no improvement in 1 week then we will increase Wellbutrin. ?Continue Xanax 0.5 mg, 1 p.o. twice daily as needed. ?Continue Wellbutrin 75 mg to 2 p.o. every morning. ?Continue Cerefolin NAC 1 p.o. daily.   ?Continue Viibryd 40 mg p.o. daily.   ?Continue Ambien 10 mg, 1/2-1 nightly as needed sleep. ?Continue counseling. ?Return in 4 wks. ? ?Donnal Moat, PA-C  ?

## 2021-08-27 MED ORDER — DEXTROMETHORPHAN HBR 15 MG PO CAPS: 45.0000 mg | ORAL_CAPSULE | Freq: Two times a day (BID) | ORAL | 0 refills | Status: DC

## 2021-08-31 ENCOUNTER — Other Ambulatory Visit (HOSPITAL_BASED_OUTPATIENT_CLINIC_OR_DEPARTMENT_OTHER): Payer: Self-pay

## 2021-08-31 DIAGNOSIS — Z79890 Hormone replacement therapy (postmenopausal): Secondary | ICD-10-CM | POA: Diagnosis not present

## 2021-08-31 DIAGNOSIS — N951 Menopausal and female climacteric states: Secondary | ICD-10-CM | POA: Diagnosis not present

## 2021-08-31 MED ORDER — ESTRADIOL 0.1 MG/24HR TD PTTW
MEDICATED_PATCH | TRANSDERMAL | 6 refills | Status: DC
  Filled 2021-08-31: qty 8, 28d supply, fill #0
  Filled 2021-09-25: qty 8, 28d supply, fill #1
  Filled 2021-10-26: qty 8, 28d supply, fill #2
  Filled 2021-11-23: qty 8, 28d supply, fill #3
  Filled 2021-12-20: qty 8, 28d supply, fill #4
  Filled 2022-01-22: qty 8, 28d supply, fill #5
  Filled 2022-02-19: qty 8, 28d supply, fill #6

## 2021-08-31 MED ORDER — PROGESTERONE MICRONIZED 100 MG PO CAPS
ORAL_CAPSULE | ORAL | 6 refills | Status: DC
  Filled 2021-08-31: qty 30, 30d supply, fill #0
  Filled 2021-10-04: qty 30, 30d supply, fill #1
  Filled 2021-10-30: qty 30, 30d supply, fill #2
  Filled 2021-12-01: qty 30, 30d supply, fill #3
  Filled 2021-12-29: qty 30, 30d supply, fill #4
  Filled 2022-01-22: qty 30, 30d supply, fill #5
  Filled 2022-03-27: qty 30, 30d supply, fill #0

## 2021-09-12 ENCOUNTER — Other Ambulatory Visit (HOSPITAL_BASED_OUTPATIENT_CLINIC_OR_DEPARTMENT_OTHER): Payer: Self-pay

## 2021-09-12 DIAGNOSIS — G4733 Obstructive sleep apnea (adult) (pediatric): Secondary | ICD-10-CM | POA: Diagnosis not present

## 2021-09-22 ENCOUNTER — Encounter (HOSPITAL_BASED_OUTPATIENT_CLINIC_OR_DEPARTMENT_OTHER): Payer: Self-pay

## 2021-09-22 ENCOUNTER — Other Ambulatory Visit (HOSPITAL_BASED_OUTPATIENT_CLINIC_OR_DEPARTMENT_OTHER): Payer: Self-pay

## 2021-09-22 ENCOUNTER — Encounter: Payer: Self-pay | Admitting: Physician Assistant

## 2021-09-22 ENCOUNTER — Ambulatory Visit (INDEPENDENT_AMBULATORY_CARE_PROVIDER_SITE_OTHER): Payer: 59 | Admitting: Physician Assistant

## 2021-09-22 DIAGNOSIS — G4700 Insomnia, unspecified: Secondary | ICD-10-CM

## 2021-09-22 DIAGNOSIS — F411 Generalized anxiety disorder: Secondary | ICD-10-CM

## 2021-09-22 DIAGNOSIS — F339 Major depressive disorder, recurrent, unspecified: Secondary | ICD-10-CM | POA: Diagnosis not present

## 2021-09-22 MED ORDER — AUVELITY 45-105 MG PO TBCR
1.0000 | EXTENDED_RELEASE_TABLET | Freq: Two times a day (BID) | ORAL | 0 refills | Status: DC
  Filled 2021-09-22: qty 60, 30d supply, fill #0
  Filled 2021-10-26: qty 60, 30d supply, fill #1
  Filled 2021-12-01 – 2021-12-04 (×2): qty 60, 30d supply, fill #2

## 2021-09-22 NOTE — Progress Notes (Signed)
[facility]  Patient ID: Aimee Hart,  MRN: [id number]  PCP: Baruch Gouty, FNP  Date of Evaluation: 09/22/2021 Time spent:20 minutes  Chief Complaint:  Chief Complaint   Depression; Follow-up     HISTORY/CURRENT STATUS: HPI For routine med check.  Added Dextromethorphan at [facility], has been on it for about 3 weeks now because she was unable to find it.  She had to order it from [employer].  Reports feeling much much better.  "I cannot tell you how much better I feel.  I have more energy and motivation.  I sleep a good 7 to 8 hours and when I get up I am ready for the day.  I do not want to go back to bed and sleep more like I had been."  She does take the Ambien a couple of times a week because she is on night shift until starting her new job next week and she is not always able to fall right to sleep.  Is much more able to enjoy things.  Went to visit her sister over [facility] Day and had a good time except for the fact that it was cold and they could not go kayaking which they love to do.  ADLs and personal hygiene are normal.  Appetite is normal and weight is stable.  She is not isolating.  She does get anxious at times, mostly situational.  Xanax does help.  No suicidal or homicidal thoughts.  She has stopped taking the Cerefolin NAC.  Has taken it a little over 3 months and she does not feel like it has made any difference.  But we added the dextromethorphan a month ago and before that the Wellbutrin.  So were uncertain if the Cerefolin could have been helping or not.  We will see how she does off of it.  It is very expensive so we may consider using a more affordable choice of L-methylfolate, even though insurance is usually will not pay for it.  We will discuss later if needed.  Patient denies increased energy with decreased need for sleep, no increased talkativeness, no racing thoughts, no impulsivity or risky behaviors, no increased spending, no increased libido, no  grandiosity, no increased irritability or anger, no paranoia, and no hallucinations.  Denies dizziness, syncope, seizures, numbness, tingling, tremor, tics, unsteady gait, slurred speech, confusion. Denies muscle or joint pain, stiffness, or dystonia.  Individual Medical History/ Review of Systems: Changes? :No     Past medications for mental health diagnoses include: Zoloft, Prozac, Paxil, Abilify, Effexor, Celexa, Cymbalta, Lamictal, Ambien, melatonin, Wellbutrin SR, Trintellix, Viibryd, Cerefolin NAC (not sure if effective at all plus was expensive) Auvelity  Allergies: Patient has no known allergies.  Current Medications:  Current Outpatient Medications:    ALPRAZolam (XANAX) 0.5 MG tablet, Take 1 tablet (0.5 mg total) by mouth 2 (two) times daily as needed for anxiety., Disp: 30 tablet, Rfl: 5   Dextromethorphan HBr 15 MG CAPS, Take 3 capsules (45 mg total) by mouth 2 (two) times daily., Disp: , Rfl: 0   Dextromethorphan-buPROPion ER (AUVELITY) 45-105 MG TBCR, Take 1 tablet by mouth 2 (two) times daily., Disp: 180 tablet, Rfl: 0   estradiol (VIVELLE-DOT) 0.05 MG/24HR patch, Apply 1 patch twice a week by transdermal route., Disp: 8 patch, Rfl: 11   estradiol (VIVELLE-DOT) 0.1 MG/24HR patch, Apply 1 patch twice a week by transdermal route., Disp: 8 patch, Rfl: 6   ibuprofen (ADVIL,MOTRIN) 200 MG tablet, Take 600 mg by mouth  every 6 (six) hours as needed for headache, mild pain or moderate pain., Disp: , Rfl:    metroNIDAZOLE (METROCREAM) 0.75 % cream, Apply as directed to affected area twice a day; Use of SPF is recommended., Disp: 45 g, Rfl: 1   omeprazole (PRILOSEC) 10 MG capsule, Take 10 mg by mouth daily., Disp: , Rfl:    progesterone (PROMETRIUM) 100 MG capsule, Take 1 capsule every day by oral route at bedtime., Disp: 30 capsule, Rfl: 11   progesterone (PROMETRIUM) 100 MG capsule, Take 1 capsule every day by oral route at bedtime., Disp: 30 capsule, Rfl: 6   Vilazodone HCl (VIIBRYD)  40 MG TABS, Take 1 tablet by mouth daily., Disp: 30 tablet, Rfl: 1   zolpidem (AMBIEN) 10 MG tablet, Take 1/2 - 1 tablet by mouth at bedtime as needed for sleep., Disp: 30 tablet, Rfl: 5   tirzepatide (MOUNJARO) 5 MG/0.5ML Pen, Inject 0.5 mLs into the skin once weekly. (Patient not taking: Reported on 06/13/2021), Disp: 2 mL, Rfl: 0 Medication Side Effects: none  Family Medical/ Social History: Changes?  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  Will be working 4 days a week.     MENTAL HEALTH EXAM:  There were no vitals taken for this visit.There is no height or weight on file to calculate BMI.  General Appearance: Casual, Neat and Well Groomed  Eye Contact:  Good  Speech:  Clear and Coherent and Normal Rate  Volume:  Normal  Mood:  Euthymic  Affect:  Congruent  Thought Process:  Goal Directed and Descriptions of Associations: Circumstantial  Orientation:  Full (Time, Place, and Person)  Thought Content: Logical   Suicidal Thoughts:  No  Homicidal Thoughts:  No  Memory:  WNL  Judgement:  Good  Insight:  Good  Psychomotor Activity:  Normal  Concentration:  Concentration: Good and Attention Span: Good  Recall:  Good  Fund of Knowledge: Good  Language: Good  Assets:  Desire for Improvement Financial Resources/Insurance Housing Resilience Transportation Vocational/Educational  ADL's:  Intact  Cognition: WNL  Prognosis:  Good   GeneSight results on chart, scanned under labs.   ECT-MADRS    [facility] Visit from 09/22/2021 in [facility] Visit from 08/22/2021 in [place] Total Score 4 [place] Office Visit from 05/05/2021 in [place] Visit from 12/29/2020 in [place] Visit from 05/14/2018 in [place]  Total GAD-7 Score 1 1 0      PHQ2-9    [place] Office Visit from 05/05/2021 in  [place] Visit from 12/29/2020 in [place] Office Visit from 12/25/2018 in [place] Office Visit from 05/14/2018 in [place] Visit from 05/08/2018 in [place]  PHQ-2 Total Score 2 1 0 0 0  PHQ-9 Total Score 4 3 -- 0 --      Flowsheet Row ED from 05/30/2021 in [facility]  C-SSRS RISK CATEGORY No Risk       DIAGNOSES:    ICD-10-CM   1. Recurrent major depression resistant to treatment ([place])  F33.9     2. Generalized anxiety disorder  F41.1     3. Insomnia, unspecified type  G47.00      Receiving Psychotherapy: Yes      RECOMMENDATIONS:  PDMP was  reviewed.  Ambien filled 08/22/2021.  Xanax filled 08/22/2021. I provided 20 minutes of face to face time during this encounter, including time spent before and after the visit in records review, medical decision making, counseling pertinent to today's visit, and charting.  She is doing excellent!  I think adding the dextromethorphan has really been beneficial.  We discussed changing now to [facility].  It has bupropion and dextromethorphan.  The dose of bupropion will be 60 mg more per day but otherwise no change and that should not cause any problems, if anything it might be more beneficial.  She understands and would like to try that. We had discussed Spravato but at this point it is not needed.  We will keep it in mind if the depression worsens at any time in the future.  Discontinue Cerefolin NAC. Discontinue bupropion. Discontinue dextromethorphan. Start Auvelity 45/105 mg, 1 p.o. twice daily.  If she is having trouble going to sleep in the evenings she can take the second dose around 4:00 or so. Continue Xanax 0.5 mg, 1 p.o. twice daily as needed. Continue Viibryd 40 mg p.o. daily.   Continue Ambien 10 mg, 1/2-1 nightly as needed sleep. Continue counseling. Return in 3  months. Donnal Moat, PA-C

## 2021-09-25 ENCOUNTER — Other Ambulatory Visit (HOSPITAL_BASED_OUTPATIENT_CLINIC_OR_DEPARTMENT_OTHER): Payer: Self-pay

## 2021-10-04 ENCOUNTER — Other Ambulatory Visit (HOSPITAL_BASED_OUTPATIENT_CLINIC_OR_DEPARTMENT_OTHER): Payer: Self-pay

## 2021-10-09 ENCOUNTER — Other Ambulatory Visit: Payer: Self-pay | Admitting: Physician Assistant

## 2021-10-10 ENCOUNTER — Other Ambulatory Visit (HOSPITAL_BASED_OUTPATIENT_CLINIC_OR_DEPARTMENT_OTHER): Payer: Self-pay

## 2021-10-10 MED ORDER — VILAZODONE HCL 40 MG PO TABS
40.0000 mg | ORAL_TABLET | Freq: Every day | ORAL | 1 refills | Status: DC
  Filled 2021-10-10: qty 30, 30d supply, fill #0
  Filled 2021-11-12: qty 30, 30d supply, fill #1

## 2021-10-16 DIAGNOSIS — L814 Other melanin hyperpigmentation: Secondary | ICD-10-CM | POA: Diagnosis not present

## 2021-10-16 DIAGNOSIS — D2272 Melanocytic nevi of left lower limb, including hip: Secondary | ICD-10-CM | POA: Diagnosis not present

## 2021-10-16 DIAGNOSIS — L813 Cafe au lait spots: Secondary | ICD-10-CM | POA: Diagnosis not present

## 2021-10-16 DIAGNOSIS — L718 Other rosacea: Secondary | ICD-10-CM | POA: Diagnosis not present

## 2021-10-16 DIAGNOSIS — L819 Disorder of pigmentation, unspecified: Secondary | ICD-10-CM | POA: Diagnosis not present

## 2021-10-16 DIAGNOSIS — D0359 Melanoma in situ of other part of trunk: Secondary | ICD-10-CM | POA: Diagnosis not present

## 2021-10-16 DIAGNOSIS — L700 Acne vulgaris: Secondary | ICD-10-CM | POA: Diagnosis not present

## 2021-10-16 DIAGNOSIS — D2261 Melanocytic nevi of right upper limb, including shoulder: Secondary | ICD-10-CM | POA: Diagnosis not present

## 2021-10-16 DIAGNOSIS — L821 Other seborrheic keratosis: Secondary | ICD-10-CM | POA: Diagnosis not present

## 2021-10-27 ENCOUNTER — Other Ambulatory Visit (HOSPITAL_BASED_OUTPATIENT_CLINIC_OR_DEPARTMENT_OTHER): Payer: Self-pay

## 2021-10-27 ENCOUNTER — Encounter (HOSPITAL_BASED_OUTPATIENT_CLINIC_OR_DEPARTMENT_OTHER): Payer: Self-pay

## 2021-10-28 DIAGNOSIS — H5213 Myopia, bilateral: Secondary | ICD-10-CM | POA: Diagnosis not present

## 2021-10-30 ENCOUNTER — Other Ambulatory Visit (HOSPITAL_BASED_OUTPATIENT_CLINIC_OR_DEPARTMENT_OTHER): Payer: Self-pay

## 2021-11-03 DIAGNOSIS — M9905 Segmental and somatic dysfunction of pelvic region: Secondary | ICD-10-CM | POA: Diagnosis not present

## 2021-11-03 DIAGNOSIS — M9907 Segmental and somatic dysfunction of upper extremity: Secondary | ICD-10-CM | POA: Diagnosis not present

## 2021-11-03 DIAGNOSIS — M9903 Segmental and somatic dysfunction of lumbar region: Secondary | ICD-10-CM | POA: Diagnosis not present

## 2021-11-03 DIAGNOSIS — M25512 Pain in left shoulder: Secondary | ICD-10-CM | POA: Diagnosis not present

## 2021-11-03 DIAGNOSIS — M546 Pain in thoracic spine: Secondary | ICD-10-CM | POA: Diagnosis not present

## 2021-11-03 DIAGNOSIS — M5441 Lumbago with sciatica, right side: Secondary | ICD-10-CM | POA: Diagnosis not present

## 2021-11-03 DIAGNOSIS — M9902 Segmental and somatic dysfunction of thoracic region: Secondary | ICD-10-CM | POA: Diagnosis not present

## 2021-11-07 DIAGNOSIS — D0359 Melanoma in situ of other part of trunk: Secondary | ICD-10-CM | POA: Diagnosis not present

## 2021-11-07 DIAGNOSIS — L988 Other specified disorders of the skin and subcutaneous tissue: Secondary | ICD-10-CM | POA: Diagnosis not present

## 2021-11-13 ENCOUNTER — Other Ambulatory Visit (HOSPITAL_BASED_OUTPATIENT_CLINIC_OR_DEPARTMENT_OTHER): Payer: Self-pay

## 2021-11-22 DIAGNOSIS — M9905 Segmental and somatic dysfunction of pelvic region: Secondary | ICD-10-CM | POA: Diagnosis not present

## 2021-11-22 DIAGNOSIS — M9902 Segmental and somatic dysfunction of thoracic region: Secondary | ICD-10-CM | POA: Diagnosis not present

## 2021-11-22 DIAGNOSIS — M5441 Lumbago with sciatica, right side: Secondary | ICD-10-CM | POA: Diagnosis not present

## 2021-11-22 DIAGNOSIS — M9907 Segmental and somatic dysfunction of upper extremity: Secondary | ICD-10-CM | POA: Diagnosis not present

## 2021-11-22 DIAGNOSIS — M25512 Pain in left shoulder: Secondary | ICD-10-CM | POA: Diagnosis not present

## 2021-11-22 DIAGNOSIS — M546 Pain in thoracic spine: Secondary | ICD-10-CM | POA: Diagnosis not present

## 2021-11-22 DIAGNOSIS — M9903 Segmental and somatic dysfunction of lumbar region: Secondary | ICD-10-CM | POA: Diagnosis not present

## 2021-11-23 ENCOUNTER — Other Ambulatory Visit (HOSPITAL_BASED_OUTPATIENT_CLINIC_OR_DEPARTMENT_OTHER): Payer: Self-pay

## 2021-12-01 ENCOUNTER — Other Ambulatory Visit (HOSPITAL_BASED_OUTPATIENT_CLINIC_OR_DEPARTMENT_OTHER): Payer: Self-pay

## 2021-12-04 ENCOUNTER — Other Ambulatory Visit (HOSPITAL_BASED_OUTPATIENT_CLINIC_OR_DEPARTMENT_OTHER): Payer: Self-pay

## 2021-12-04 ENCOUNTER — Telehealth: Payer: Self-pay

## 2021-12-04 NOTE — Telephone Encounter (Signed)
Noted  

## 2021-12-04 NOTE — Telephone Encounter (Signed)
Prior Authorization submitted and approved for AUVELITY 45-105 MG with [employer] effective 12/04/2021-02/02/2022 maximum of 2 fills with [employer]

## 2021-12-06 DIAGNOSIS — M9902 Segmental and somatic dysfunction of thoracic region: Secondary | ICD-10-CM | POA: Diagnosis not present

## 2021-12-06 DIAGNOSIS — M9905 Segmental and somatic dysfunction of pelvic region: Secondary | ICD-10-CM | POA: Diagnosis not present

## 2021-12-06 DIAGNOSIS — M546 Pain in thoracic spine: Secondary | ICD-10-CM | POA: Diagnosis not present

## 2021-12-06 DIAGNOSIS — M25512 Pain in left shoulder: Secondary | ICD-10-CM | POA: Diagnosis not present

## 2021-12-06 DIAGNOSIS — M5441 Lumbago with sciatica, right side: Secondary | ICD-10-CM | POA: Diagnosis not present

## 2021-12-06 DIAGNOSIS — M9903 Segmental and somatic dysfunction of lumbar region: Secondary | ICD-10-CM | POA: Diagnosis not present

## 2021-12-06 DIAGNOSIS — M9907 Segmental and somatic dysfunction of upper extremity: Secondary | ICD-10-CM | POA: Diagnosis not present

## 2021-12-11 ENCOUNTER — Other Ambulatory Visit: Payer: Self-pay | Admitting: Physician Assistant

## 2021-12-12 ENCOUNTER — Other Ambulatory Visit (HOSPITAL_BASED_OUTPATIENT_CLINIC_OR_DEPARTMENT_OTHER): Payer: Self-pay

## 2021-12-12 MED ORDER — VILAZODONE HCL 40 MG PO TABS
40.0000 mg | ORAL_TABLET | Freq: Every day | ORAL | 0 refills | Status: DC
  Filled 2021-12-12: qty 30, 30d supply, fill #0

## 2021-12-15 DIAGNOSIS — G4733 Obstructive sleep apnea (adult) (pediatric): Secondary | ICD-10-CM | POA: Diagnosis not present

## 2021-12-20 ENCOUNTER — Other Ambulatory Visit (HOSPITAL_BASED_OUTPATIENT_CLINIC_OR_DEPARTMENT_OTHER): Payer: Self-pay

## 2021-12-26 ENCOUNTER — Other Ambulatory Visit (HOSPITAL_BASED_OUTPATIENT_CLINIC_OR_DEPARTMENT_OTHER): Payer: Self-pay

## 2021-12-26 ENCOUNTER — Ambulatory Visit: Payer: 59 | Admitting: Physician Assistant

## 2021-12-29 ENCOUNTER — Other Ambulatory Visit (HOSPITAL_BASED_OUTPATIENT_CLINIC_OR_DEPARTMENT_OTHER): Payer: Self-pay

## 2022-01-07 ENCOUNTER — Other Ambulatory Visit: Payer: Self-pay | Admitting: Physician Assistant

## 2022-01-09 ENCOUNTER — Other Ambulatory Visit (HOSPITAL_BASED_OUTPATIENT_CLINIC_OR_DEPARTMENT_OTHER): Payer: Self-pay

## 2022-01-09 MED ORDER — VILAZODONE HCL 40 MG PO TABS
40.0000 mg | ORAL_TABLET | Freq: Every day | ORAL | 0 refills | Status: DC
  Filled 2022-01-09: qty 30, 30d supply, fill #0

## 2022-01-09 MED ORDER — AUVELITY 45-105 MG PO TBCR
1.0000 | EXTENDED_RELEASE_TABLET | Freq: Two times a day (BID) | ORAL | 0 refills | Status: DC
  Filled 2022-01-09: qty 180, 90d supply, fill #0

## 2022-01-10 ENCOUNTER — Other Ambulatory Visit (HOSPITAL_BASED_OUTPATIENT_CLINIC_OR_DEPARTMENT_OTHER): Payer: Self-pay

## 2022-01-15 ENCOUNTER — Ambulatory Visit: Payer: 59 | Admitting: Physician Assistant

## 2022-01-22 ENCOUNTER — Other Ambulatory Visit (HOSPITAL_BASED_OUTPATIENT_CLINIC_OR_DEPARTMENT_OTHER): Payer: Self-pay

## 2022-01-22 ENCOUNTER — Other Ambulatory Visit: Payer: Self-pay | Admitting: Physician Assistant

## 2022-01-26 ENCOUNTER — Encounter: Payer: Self-pay | Admitting: Physician Assistant

## 2022-01-26 ENCOUNTER — Ambulatory Visit (INDEPENDENT_AMBULATORY_CARE_PROVIDER_SITE_OTHER): Payer: 59 | Admitting: Physician Assistant

## 2022-01-26 ENCOUNTER — Other Ambulatory Visit (HOSPITAL_BASED_OUTPATIENT_CLINIC_OR_DEPARTMENT_OTHER): Payer: Self-pay

## 2022-01-26 DIAGNOSIS — G4700 Insomnia, unspecified: Secondary | ICD-10-CM

## 2022-01-26 DIAGNOSIS — F411 Generalized anxiety disorder: Secondary | ICD-10-CM

## 2022-01-26 DIAGNOSIS — F3342 Major depressive disorder, recurrent, in full remission: Secondary | ICD-10-CM

## 2022-01-26 MED ORDER — VILAZODONE HCL 40 MG PO TABS
40.0000 mg | ORAL_TABLET | Freq: Every day | ORAL | 1 refills | Status: DC
  Filled 2022-01-26 – 2022-05-24 (×4): qty 90, 90d supply, fill #0

## 2022-01-26 NOTE — Progress Notes (Signed)
[facility]  Patient ID: Aimee Hart,  MRN: [id number]  PCP: Baruch Gouty, FNP  Date of Evaluation: 01/26/2022 Time spent:20 minutes  Chief Complaint:  Chief Complaint   Anxiety; Depression; Insomnia; Follow-up    HISTORY/CURRENT STATUS: HPI For routine med check.  She is doing really well since being on the [facility] for 4 months or so now.  She is able to enjoy things, is dating a new guy.  Energy and motivation are good.  Work is going well, is in a new job at [employer] care and really likes it a lot.  No extreme sadness, tearfulness, or feelings of hopelessness.  Sleeps well most of the time.  Does need the Ambien sometimes.  ADLs and personal hygiene are normal.   Denies any changes in concentration, making decisions, or remembering things.  Appetite has not changed.  She is eating healthier to lose some weight and has been successful, losing about 1 pound per week.  Anxiety is well controlled.  She does take Xanax on occasion.  It is still effective.  Her first grandchild (granddaughter) will be born sometime in the next few weeks so she is excited about that.  Denies suicidal or homicidal thoughts.  Patient denies increased energy with decreased need for sleep, increased talkativeness, racing thoughts, impulsivity or risky behaviors, increased spending, increased libido, grandiosity, increased irritability or anger, paranoia, or hallucinations.  Denies dizziness, syncope, seizures, numbness, tingling, tremor, tics, unsteady gait, slurred speech, confusion. Denies muscle or joint pain, stiffness, or dystonia.  Individual Medical History/ Review of Systems: Changes? :No     Past medications for mental health diagnoses include: Zoloft, Prozac, Paxil, Abilify, Effexor, Celexa, Cymbalta, Lamictal, Ambien, melatonin, Wellbutrin SR, Trintellix, Viibryd, Cerefolin NAC (not sure if effective at all plus was expensive) Auvelity  Allergies: Patient has no  known allergies.  Current Medications:  Current Outpatient Medications:    ALPRAZolam (XANAX) 0.5 MG tablet, Take 1 tablet (0.5 mg total) by mouth 2 (two) times daily as needed for anxiety., Disp: 30 tablet, Rfl: 5   Dextromethorphan-buPROPion ER (AUVELITY) 45-105 MG TBCR, Take 1 tablet by mouth 2 (two) times daily., Disp: 180 tablet, Rfl: 0   estradiol (VIVELLE-DOT) 0.05 MG/24HR patch, Apply 1 patch twice a week by transdermal route., Disp: 8 patch, Rfl: 11   estradiol (VIVELLE-DOT) 0.1 MG/24HR patch, Apply 1 patch twice a week by transdermal route., Disp: 8 patch, Rfl: 6   ibuprofen (ADVIL,MOTRIN) 200 MG tablet, Take 600 mg by mouth every 6 (six) hours as needed for headache, mild pain or moderate pain., Disp: , Rfl:    metroNIDAZOLE (METROCREAM) 0.75 % cream, Apply as directed to affected area twice a day; Use of SPF is recommended., Disp: 45 g, Rfl: 1   omeprazole (PRILOSEC) 10 MG capsule, Take 10 mg by mouth daily., Disp: , Rfl:    progesterone (PROMETRIUM) 100 MG capsule, Take 1 capsule every day by oral route at bedtime., Disp: 30 capsule, Rfl: 11   progesterone (PROMETRIUM) 100 MG capsule, Take 1 capsule every day by oral route at bedtime., Disp: 30 capsule, Rfl: 6   zolpidem (AMBIEN) 10 MG tablet, Take 1/2 - 1 tablet by mouth at bedtime as needed for sleep., Disp: 30 tablet, Rfl: 5   Vilazodone HCl (VIIBRYD) 40 MG TABS, Take 1 tablet by mouth daily., Disp: 90 tablet, Rfl: 1 Medication Side Effects: none  Family Medical/ Social History: Changes?  See HPI   MENTAL HEALTH EXAM:  There  were no vitals taken for this visit.There is no height or weight on file to calculate BMI.  General Appearance: Casual, Neat and Well Groomed  Eye Contact:  Good  Speech:  Clear and Coherent and Normal Rate  Volume:  Normal  Mood:  Euthymic  Affect:  Congruent  Thought Process:  Goal Directed and Descriptions of Associations: Circumstantial  Orientation:  Full (Time, Place, and Person)  Thought  Content: Logical   Suicidal Thoughts:  No  Homicidal Thoughts:  No  Memory:  WNL  Judgement:  Good  Insight:  Good  Psychomotor Activity:  Normal  Concentration:  Concentration: Good and Attention Span: Good  Recall:  Good  Fund of Knowledge: Good  Language: Good  Assets:  Desire for Improvement Financial Resources/Insurance Housing Transportation Vocational/Educational  ADL's:  Intact  Cognition: WNL  Prognosis:  Good   GeneSight results on chart, scanned under labs.   ECT-MADRS    [place] Visit from 09/22/2021 in [place] Visit from 08/22/2021 in [place] Total Score 4 [place] Office Visit from 05/05/2021 in [place] Visit from 12/29/2020 in [place] Visit from 05/14/2018 in [place]  Total GAD-7 Score 1 1 0      PHQ2-9    [place] Office Visit from 05/05/2021 in [place] Visit from 12/29/2020 in [place] Office Visit from 12/25/2018 in [place] Office Visit from 05/14/2018 in [place] Visit from 05/08/2018 in [place]  PHQ-2 Total Score 2 1 0 0 0  PHQ-9 Total Score 4 3 -- 0 --      Flowsheet Row ED from 05/30/2021 in [facility]  C-SSRS RISK CATEGORY No Risk      DIAGNOSES:    ICD-10-CM   1. Major depression, recurrent, full remission ([place])  F33.42     2. Generalized anxiety disorder  F41.1     3. Insomnia, unspecified type  G47.00       Receiving Psychotherapy: Yes     RECOMMENDATIONS:  PDMP was reviewed.  Ambien filled 12/26/2021.  Xanax filled 12/26/2021. I provided 20 minutes of face to face time during this encounter, including time spent before and after the visit in records review, medical decision making,  counseling pertinent to today's visit, and charting.   She is doing great!  No change in treatment necessary.  Continue Xanax 0.5 mg, 1 p.o. twice daily as needed. Continue Auvelity 45/105 mg, 1 p.o. twice daily.   Continue Viibryd 40 mg p.o. daily.   Continue Ambien 10 mg, 1/2-1 nightly as needed sleep. Continue counseling. Return in 6 months.   Donnal Moat, PA-C

## 2022-02-07 ENCOUNTER — Telehealth: Payer: Self-pay

## 2022-02-07 NOTE — Telephone Encounter (Signed)
Prior Authorization submitted and approved for AUVELITY 45-105 MG effective 02/07/2022-02/07/2023 with [employer]

## 2022-02-08 ENCOUNTER — Other Ambulatory Visit (HOSPITAL_BASED_OUTPATIENT_CLINIC_OR_DEPARTMENT_OTHER): Payer: Self-pay

## 2022-02-19 ENCOUNTER — Other Ambulatory Visit (HOSPITAL_BASED_OUTPATIENT_CLINIC_OR_DEPARTMENT_OTHER): Payer: Self-pay

## 2022-03-01 ENCOUNTER — Other Ambulatory Visit (HOSPITAL_BASED_OUTPATIENT_CLINIC_OR_DEPARTMENT_OTHER): Payer: Self-pay

## 2022-03-01 DIAGNOSIS — Z6833 Body mass index (BMI) 33.0-33.9, adult: Secondary | ICD-10-CM | POA: Diagnosis not present

## 2022-03-01 DIAGNOSIS — Z1231 Encounter for screening mammogram for malignant neoplasm of breast: Secondary | ICD-10-CM | POA: Diagnosis not present

## 2022-03-01 DIAGNOSIS — Z01419 Encounter for gynecological examination (general) (routine) without abnormal findings: Secondary | ICD-10-CM | POA: Diagnosis not present

## 2022-03-01 MED ORDER — PROGESTERONE MICRONIZED 100 MG PO CAPS
100.0000 mg | ORAL_CAPSULE | Freq: Every day | ORAL | 6 refills | Status: DC
  Filled 2022-03-01 – 2022-04-02 (×4): qty 30, 30d supply, fill #0
  Filled 2022-04-04: qty 20, 20d supply, fill #0
  Filled 2022-04-04: qty 30, 30d supply, fill #0
  Filled 2022-04-04: qty 10, 10d supply, fill #0
  Filled 2022-05-02: qty 30, 30d supply, fill #1
  Filled 2022-06-07: qty 30, 30d supply, fill #2
  Filled 2022-07-11: qty 30, 30d supply, fill #3
  Filled 2022-08-08: qty 30, 30d supply, fill #4
  Filled 2022-09-05: qty 30, 30d supply, fill #5

## 2022-03-01 MED ORDER — ESTRADIOL 0.1 MG/24HR TD PTTW
MEDICATED_PATCH | TRANSDERMAL | 6 refills | Status: DC
  Filled 2022-03-23 – 2022-06-07 (×5): qty 8, 28d supply, fill #0
  Filled 2022-06-07 – 2022-07-02 (×2): qty 8, 28d supply, fill #1
  Filled 2022-07-29: qty 8, 28d supply, fill #2
  Filled 2022-08-22: qty 8, 28d supply, fill #3
  Filled 2022-09-24: qty 8, 28d supply, fill #4

## 2022-03-02 LAB — RESULTS CONSOLE HPV: CHL HPV: NEGATIVE

## 2022-03-02 LAB — HM MAMMOGRAPHY

## 2022-03-20 ENCOUNTER — Encounter: Payer: Self-pay | Admitting: *Deleted

## 2022-03-23 ENCOUNTER — Other Ambulatory Visit (HOSPITAL_BASED_OUTPATIENT_CLINIC_OR_DEPARTMENT_OTHER): Payer: Self-pay

## 2022-03-27 ENCOUNTER — Other Ambulatory Visit (HOSPITAL_COMMUNITY): Payer: Self-pay

## 2022-03-27 ENCOUNTER — Other Ambulatory Visit (HOSPITAL_BASED_OUTPATIENT_CLINIC_OR_DEPARTMENT_OTHER): Payer: Self-pay

## 2022-03-30 ENCOUNTER — Other Ambulatory Visit (HOSPITAL_COMMUNITY): Payer: Self-pay

## 2022-04-02 ENCOUNTER — Other Ambulatory Visit (HOSPITAL_COMMUNITY): Payer: Self-pay

## 2022-04-02 ENCOUNTER — Other Ambulatory Visit (HOSPITAL_BASED_OUTPATIENT_CLINIC_OR_DEPARTMENT_OTHER): Payer: Self-pay

## 2022-04-03 ENCOUNTER — Other Ambulatory Visit (HOSPITAL_COMMUNITY): Payer: Self-pay

## 2022-04-03 ENCOUNTER — Encounter (HOSPITAL_COMMUNITY): Payer: Self-pay | Admitting: Pharmacist

## 2022-04-04 ENCOUNTER — Encounter (HOSPITAL_COMMUNITY): Payer: Self-pay

## 2022-04-04 ENCOUNTER — Other Ambulatory Visit (HOSPITAL_BASED_OUTPATIENT_CLINIC_OR_DEPARTMENT_OTHER): Payer: Self-pay

## 2022-04-04 ENCOUNTER — Other Ambulatory Visit (HOSPITAL_COMMUNITY): Payer: Self-pay

## 2022-04-05 ENCOUNTER — Other Ambulatory Visit: Payer: Self-pay

## 2022-04-05 DIAGNOSIS — N926 Irregular menstruation, unspecified: Secondary | ICD-10-CM | POA: Diagnosis not present

## 2022-04-05 DIAGNOSIS — N939 Abnormal uterine and vaginal bleeding, unspecified: Secondary | ICD-10-CM | POA: Diagnosis not present

## 2022-04-06 ENCOUNTER — Encounter: Payer: Self-pay | Admitting: Family Medicine

## 2022-04-06 ENCOUNTER — Ambulatory Visit (INDEPENDENT_AMBULATORY_CARE_PROVIDER_SITE_OTHER): Payer: 59 | Admitting: Family Medicine

## 2022-04-06 VITALS — BP 108/68 | HR 84 | Temp 98.1°F | Ht 65.0 in | Wt 203.0 lb

## 2022-04-06 DIAGNOSIS — G4733 Obstructive sleep apnea (adult) (pediatric): Secondary | ICD-10-CM

## 2022-04-06 NOTE — Progress Notes (Signed)
Pt did not need visit today, just needed a new referral for sleep study. Referral placed.

## 2022-04-11 ENCOUNTER — Other Ambulatory Visit: Payer: Self-pay

## 2022-04-24 ENCOUNTER — Other Ambulatory Visit (HOSPITAL_BASED_OUTPATIENT_CLINIC_OR_DEPARTMENT_OTHER): Payer: Self-pay

## 2022-04-24 ENCOUNTER — Other Ambulatory Visit: Payer: Self-pay | Admitting: Physician Assistant

## 2022-04-25 ENCOUNTER — Other Ambulatory Visit (HOSPITAL_BASED_OUTPATIENT_CLINIC_OR_DEPARTMENT_OTHER): Payer: Self-pay

## 2022-04-25 DIAGNOSIS — M25561 Pain in right knee: Secondary | ICD-10-CM | POA: Diagnosis not present

## 2022-04-25 MED ORDER — ALPRAZOLAM 0.5 MG PO TABS
0.5000 mg | ORAL_TABLET | Freq: Two times a day (BID) | ORAL | 4 refills | Status: DC | PRN
  Filled 2022-04-25: qty 30, 15d supply, fill #0

## 2022-04-25 MED ORDER — PREDNISONE 5 MG PO TABS
ORAL_TABLET | ORAL | 0 refills | Status: DC
  Filled 2022-04-25: qty 21, 6d supply, fill #0

## 2022-04-25 MED ORDER — ZOLPIDEM TARTRATE 10 MG PO TABS
5.0000 mg | ORAL_TABLET | Freq: Every evening | ORAL | 4 refills | Status: DC | PRN
  Filled 2022-04-25: qty 30, 30d supply, fill #0
  Filled 2022-07-29: qty 30, 30d supply, fill #1

## 2022-04-25 NOTE — Telephone Encounter (Signed)
Filled 9/8 and 10/27

## 2022-04-26 ENCOUNTER — Other Ambulatory Visit (HOSPITAL_BASED_OUTPATIENT_CLINIC_OR_DEPARTMENT_OTHER): Payer: Self-pay

## 2022-05-02 ENCOUNTER — Other Ambulatory Visit (HOSPITAL_COMMUNITY): Payer: Self-pay

## 2022-05-02 ENCOUNTER — Encounter (HOSPITAL_COMMUNITY): Payer: Self-pay

## 2022-05-02 ENCOUNTER — Other Ambulatory Visit (HOSPITAL_BASED_OUTPATIENT_CLINIC_OR_DEPARTMENT_OTHER): Payer: Self-pay

## 2022-05-03 ENCOUNTER — Other Ambulatory Visit: Payer: Self-pay

## 2022-05-03 ENCOUNTER — Other Ambulatory Visit (HOSPITAL_BASED_OUTPATIENT_CLINIC_OR_DEPARTMENT_OTHER): Payer: Self-pay

## 2022-05-03 ENCOUNTER — Encounter: Payer: Self-pay | Admitting: Family Medicine

## 2022-05-17 DIAGNOSIS — G4733 Obstructive sleep apnea (adult) (pediatric): Secondary | ICD-10-CM | POA: Diagnosis not present

## 2022-05-24 ENCOUNTER — Other Ambulatory Visit (HOSPITAL_BASED_OUTPATIENT_CLINIC_OR_DEPARTMENT_OTHER): Payer: Self-pay

## 2022-05-28 ENCOUNTER — Institutional Professional Consult (permissible substitution): Payer: Commercial Managed Care - PPO | Admitting: Neurology

## 2022-06-07 ENCOUNTER — Other Ambulatory Visit: Payer: Self-pay | Admitting: Physician Assistant

## 2022-06-07 ENCOUNTER — Other Ambulatory Visit (HOSPITAL_BASED_OUTPATIENT_CLINIC_OR_DEPARTMENT_OTHER): Payer: Self-pay

## 2022-06-07 ENCOUNTER — Other Ambulatory Visit (HOSPITAL_COMMUNITY): Payer: Self-pay

## 2022-06-07 MED ORDER — AUVELITY 45-105 MG PO TBCR
1.0000 | EXTENDED_RELEASE_TABLET | Freq: Two times a day (BID) | ORAL | 0 refills | Status: DC
  Filled 2022-06-07: qty 180, 90d supply, fill #0

## 2022-06-08 ENCOUNTER — Other Ambulatory Visit: Payer: Self-pay

## 2022-06-08 ENCOUNTER — Other Ambulatory Visit (HOSPITAL_BASED_OUTPATIENT_CLINIC_OR_DEPARTMENT_OTHER): Payer: Self-pay

## 2022-06-17 DIAGNOSIS — G4733 Obstructive sleep apnea (adult) (pediatric): Secondary | ICD-10-CM | POA: Diagnosis not present

## 2022-07-12 ENCOUNTER — Other Ambulatory Visit (HOSPITAL_BASED_OUTPATIENT_CLINIC_OR_DEPARTMENT_OTHER): Payer: Self-pay

## 2022-07-16 DIAGNOSIS — G4733 Obstructive sleep apnea (adult) (pediatric): Secondary | ICD-10-CM | POA: Diagnosis not present

## 2022-07-26 ENCOUNTER — Encounter: Payer: Self-pay | Admitting: Physician Assistant

## 2022-07-26 ENCOUNTER — Ambulatory Visit: Payer: Commercial Managed Care - PPO | Admitting: Physician Assistant

## 2022-07-26 ENCOUNTER — Other Ambulatory Visit (HOSPITAL_BASED_OUTPATIENT_CLINIC_OR_DEPARTMENT_OTHER): Payer: Self-pay

## 2022-07-26 DIAGNOSIS — R454 Irritability and anger: Secondary | ICD-10-CM

## 2022-07-26 DIAGNOSIS — F411 Generalized anxiety disorder: Secondary | ICD-10-CM | POA: Diagnosis not present

## 2022-07-26 DIAGNOSIS — R5381 Other malaise: Secondary | ICD-10-CM

## 2022-07-26 DIAGNOSIS — F331 Major depressive disorder, recurrent, moderate: Secondary | ICD-10-CM

## 2022-07-26 DIAGNOSIS — G4700 Insomnia, unspecified: Secondary | ICD-10-CM

## 2022-07-26 DIAGNOSIS — Z79899 Other long term (current) drug therapy: Secondary | ICD-10-CM | POA: Diagnosis not present

## 2022-07-26 MED ORDER — VILAZODONE HCL 40 MG PO TABS
40.0000 mg | ORAL_TABLET | Freq: Every day | ORAL | 3 refills | Status: DC
  Filled 2022-07-26 – 2022-08-20 (×2): qty 90, 90d supply, fill #0
  Filled 2022-08-22: qty 30, 30d supply, fill #0
  Filled 2022-08-22: qty 60, 60d supply, fill #0
  Filled 2022-09-24 – 2022-11-30 (×2): qty 90, 90d supply, fill #1
  Filled 2023-03-05 – 2023-03-06 (×2): qty 90, 90d supply, fill #2
  Filled 2023-06-14: qty 90, 90d supply, fill #3

## 2022-07-26 NOTE — Progress Notes (Signed)
[facility]  Patient ID: Aimee Hart,  MRN: [id number]  PCP: Sonny Masters, FNP  Date of Evaluation: 07/26/2022 Time spent:20 minutes  Chief Complaint:  Chief Complaint   Anxiety; Depression; Insomnia; Follow-up    HISTORY/CURRENT STATUS: HPI For routine med check.  Depression is well-controlled.  Patient is able to enjoy things.  Energy and motivation are good.  No extreme sadness, tearfulness, or feelings of hopelessness.  Even with Ambien, she's not sleeping well.  She does use her CPAP every night.  Feels like there are a lot of changes with her body from perimenopause, hair is thinning, skin is dry, eyes feel gritty.  She is tired a lot.  ADLs and personal hygiene are normal.   Denies any changes in concentration, making decisions, or remembering things.  Appetite has not changed.  Weight is stable.  Denies suicidal or homicidal thoughts.  Her main complaint is being more irritable and angry, has a short fuse.  Having difficulty at work because she is having to deal with people, mostly coworkers that frustrate her.  This has been going on for a couple of months.  She is moving into a different role at work so that she does not have to be around people so much.  Not having panic attacks but does get overwhelmed.  Xanax helps when needed.  Patient denies increased energy with decreased need for sleep, increased talkativeness, racing thoughts, impulsivity or risky behaviors, increased spending, increased libido, grandiosity, paranoia, or hallucinations.  Denies dizziness, syncope, seizures, numbness, tingling, tremor, tics, unsteady gait, slurred speech, confusion. Denies muscle or joint pain, stiffness, or dystonia. Denies unexplained weight loss, frequent infections, or sores that heal slowly.  No polyphagia, polydipsia, or polyuria. Denies visual changes or paresthesias.   Individual Medical History/ Review of Systems: Changes? :No     Past medications for mental  health diagnoses include: Zoloft, Prozac, Paxil, Abilify, Effexor, Celexa, Cymbalta, Lamictal, Ambien, melatonin, Wellbutrin SR, Trintellix, Viibryd, Cerefolin NAC (not sure if effective at all plus was expensive) Auvelity  Allergies: Patient has no known allergies.  Current Medications:  Current Outpatient Medications:    ALPRAZolam (XANAX) 0.5 MG tablet, Take 1 tablet (0.5 mg total) by mouth 2 (two) times daily as needed for anxiety., Disp: 30 tablet, Rfl: 4   Cyanocobalamin (B-12 DOTS SL), Place under the tongue., Disp: , Rfl:    Dextromethorphan-buPROPion ER (AUVELITY) 45-105 MG TBCR, Take 1 tablet by mouth 2 (two) times daily., Disp: 180 tablet, Rfl: 0   estradiol (VIVELLE-DOT) 0.1 MG/24HR patch, Apply 1 patch twice a week by transdermal route., Disp: 8 patch, Rfl: 6   ibuprofen (ADVIL,MOTRIN) 200 MG tablet, Take 600 mg by mouth every 6 (six) hours as needed for headache, mild pain or moderate pain., Disp: , Rfl:    metroNIDAZOLE (METROCREAM) 0.75 % cream, Apply as directed to affected area twice a day; Use of SPF is recommended., Disp: 45 g, Rfl: 1   omeprazole (PRILOSEC) 10 MG capsule, Take 10 mg by mouth daily., Disp: , Rfl:    progesterone (PROMETRIUM) 100 MG capsule, Take 1 capsule every day by oral route at bedtime., Disp: 30 capsule, Rfl: 11   progesterone (PROMETRIUM) 100 MG capsule, Take 1 capsule every day by oral route at bedtime., Disp: 30 capsule, Rfl: 6   progesterone (PROMETRIUM) 100 MG capsule, Take 1 capsule (100 mg total) by mouth at bedtime., Disp: 30 capsule, Rfl: 6   zolpidem (AMBIEN) 10 MG tablet, Take 1/2 - 1 tablet  by mouth at bedtime as needed for sleep., Disp: 30 tablet, Rfl: 4   Vilazodone HCl (VIIBRYD) 40 MG TABS, Take 1 tablet by mouth daily., Disp: 90 tablet, Rfl: 3 Medication Side Effects: none  Family Medical/ Social History: Changes?  Her first grandchild, granddaughter Shea Evans, was born in October 2023.   MENTAL HEALTH EXAM:  There were no vitals taken  for this visit.There is no height or weight on file to calculate BMI.  General Appearance: Casual, Neat and Well Groomed  Eye Contact:  Good  Speech:  Clear and Coherent and Normal Rate  Volume:  Normal  Mood:  Euthymic  Affect:  Congruent  Thought Process:  Goal Directed and Descriptions of Associations: Circumstantial  Orientation:  Full (Time, Place, and Person)  Thought Content: Logical   Suicidal Thoughts:  No  Homicidal Thoughts:  No  Memory:  WNL  Judgement:  Good  Insight:  Good  Psychomotor Activity:  Normal  Concentration:  Concentration: Good and Attention Span: Good  Recall:  Good  Fund of Knowledge: Good  Language: Good  Assets:  Communication Skills Desire for Improvement Financial Resources/Insurance Housing Transportation Vocational/Educational  ADL's:  Intact  Cognition: WNL  Prognosis:  Good   GeneSight results on chart, scanned under labs.   ECT-MADRS    Flowsheet Row [facility] Visit from 09/22/2021 in [facility] Office Visit from 08/22/2021 in [facility]  MADRS Total Score 4 36      GAD-7    Flowsheet Row Office Visit from 05/05/2021 in [facility] Office Visit from 12/29/2020 in [facility] Office Visit from 05/14/2018 in [facility]  Total GAD-7 Score 1 1 0      PHQ2-9    Flowsheet Row Office Visit from 05/05/2021 in [facility] Office Visit from 12/29/2020 in [facility] Office Visit from 12/25/2018 in [facility] Office Visit from 05/14/2018 in [facility] Office Visit from 05/08/2018 in [facility]  PHQ-2 Total Score 2 1 0 0 0  PHQ-9 Total Score 4 3 -- 0 --      Flowsheet Row ED from 05/30/2021 in [facility]  Department at [facility]  C-SSRS RISK CATEGORY No Risk      DIAGNOSES:    ICD-10-CM   1. Major depressive disorder, recurrent episode, moderate  F33.1 TSH    VITAMIN D 25 Hydroxy (Vit-D Deficiency, Fractures)    B12 and Folate Panel    2. Generalized anxiety disorder  F41.1 TSH    3. Insomnia, unspecified type  G47.00 TSH    4. Malaise and fatigue  R53.81 CBC with Differential/Platelet   R53.83 Comprehensive metabolic panel    Hemoglobin A1c    TSH    VITAMIN D 25 Hydroxy (Vit-D Deficiency, Fractures)    B12 and Folate Panel    5. Irritability and anger  R45.4 TSH    6. Encounter for long-term (current) use of medications  Z79.899 CBC with Differential/Platelet    TSH    Lipid panel    VITAMIN D 25 Hydroxy (Vit-D Deficiency, Fractures)    B12 and Folate Panel     Receiving Psychotherapy: Yes     RECOMMENDATIONS:  PDMP was reviewed.  Ambien filled 04/25/2022.  Xanax filled 04/25/2022. I provided 30 minutes of face to face time during  this encounter, including time spent before and after the visit in records review, medical decision making, counseling pertinent to today's visit, and charting.   We discussed her different symptoms including fatigue, changes in hair and skin, irritability and moodiness.  She has a physical scheduled in a few months, can be seen sooner so I recommend checking labs.  We are also considering an antipsychotic so I would need a glucose and lipid panel anyway.  She understands and would like to proceed.  No changes until we get labs back.  Continue Xanax 0.5 mg, 1 p.o. twice daily as needed. Continue Auvelity 45/105 mg, 1 p.o. twice daily.   Continue Viibryd 40 mg p.o. daily.   Continue Ambien 10 mg, 1/2-1 nightly as needed sleep. Continue counseling. Return in 4 weeks.  Melony Overly, PA-C

## 2022-07-27 DIAGNOSIS — R5383 Other fatigue: Secondary | ICD-10-CM | POA: Diagnosis not present

## 2022-07-27 DIAGNOSIS — R454 Irritability and anger: Secondary | ICD-10-CM | POA: Diagnosis not present

## 2022-07-27 DIAGNOSIS — G4700 Insomnia, unspecified: Secondary | ICD-10-CM | POA: Diagnosis not present

## 2022-07-27 DIAGNOSIS — Z79899 Other long term (current) drug therapy: Secondary | ICD-10-CM | POA: Diagnosis not present

## 2022-07-27 DIAGNOSIS — F331 Major depressive disorder, recurrent, moderate: Secondary | ICD-10-CM | POA: Diagnosis not present

## 2022-07-27 DIAGNOSIS — F411 Generalized anxiety disorder: Secondary | ICD-10-CM | POA: Diagnosis not present

## 2022-07-27 DIAGNOSIS — R5381 Other malaise: Secondary | ICD-10-CM | POA: Diagnosis not present

## 2022-07-29 ENCOUNTER — Other Ambulatory Visit (HOSPITAL_BASED_OUTPATIENT_CLINIC_OR_DEPARTMENT_OTHER): Payer: Self-pay

## 2022-07-30 ENCOUNTER — Other Ambulatory Visit: Payer: Self-pay

## 2022-07-30 LAB — CBC WITH DIFFERENTIAL/PLATELET
Basophils Absolute: 0.1 10*3/uL (ref 0.0–0.2)
Basos: 2 %
EOS (ABSOLUTE): 0.2 10*3/uL (ref 0.0–0.4)
Eos: 4 %
Hematocrit: 38.7 % (ref 34.0–46.6)
Hemoglobin: 12.9 g/dL (ref 11.1–15.9)
Immature Grans (Abs): 0 10*3/uL (ref 0.0–0.1)
Immature Granulocytes: 0 %
Lymphocytes Absolute: 1.4 10*3/uL (ref 0.7–3.1)
Lymphs: 31 %
MCH: 27 pg (ref 26.6–33.0)
MCHC: 33.3 g/dL (ref 31.5–35.7)
MCV: 81 fL (ref 79–97)
Monocytes Absolute: 0.5 10*3/uL (ref 0.1–0.9)
Monocytes: 11 %
Neutrophils Absolute: 2.4 10*3/uL (ref 1.4–7.0)
Neutrophils: 52 %
Platelets: 277 10*3/uL (ref 150–450)
RBC: 4.78 x10E6/uL (ref 3.77–5.28)
RDW: 14.3 % (ref 11.7–15.4)
WBC: 4.5 10*3/uL (ref 3.4–10.8)

## 2022-07-30 LAB — COMPREHENSIVE METABOLIC PANEL
ALT: 9 IU/L (ref 0–32)
AST: 13 IU/L (ref 0–40)
Albumin/Globulin Ratio: 1.8 (ref 1.2–2.2)
Albumin: 4 g/dL (ref 3.8–4.9)
Alkaline Phosphatase: 65 IU/L (ref 44–121)
BUN/Creatinine Ratio: 16 (ref 9–23)
BUN: 17 mg/dL (ref 6–24)
Bilirubin Total: 0.3 mg/dL (ref 0.0–1.2)
CO2: 21 mmol/L (ref 20–29)
Calcium: 8.7 mg/dL (ref 8.7–10.2)
Chloride: 105 mmol/L (ref 96–106)
Creatinine, Ser: 1.04 mg/dL — ABNORMAL HIGH (ref 0.57–1.00)
Globulin, Total: 2.2 g/dL (ref 1.5–4.5)
Glucose: 110 mg/dL — ABNORMAL HIGH (ref 70–99)
Potassium: 4.6 mmol/L (ref 3.5–5.2)
Sodium: 137 mmol/L (ref 134–144)
Total Protein: 6.2 g/dL (ref 6.0–8.5)
eGFR: 63 mL/min/{1.73_m2} (ref >59)

## 2022-07-30 LAB — TSH: TSH: 1.51 u[IU]/mL (ref 0.450–4.500)

## 2022-07-30 LAB — LIPID PANEL
Chol/HDL Ratio: 4.1 ratio (ref 0.0–4.4)
Cholesterol, Total: 195 mg/dL (ref 100–199)
HDL: 47 mg/dL (ref >39)
LDL Chol Calc (NIH): 128 mg/dL — ABNORMAL HIGH (ref 0–99)
Triglycerides: 111 mg/dL (ref 0–149)
VLDL Cholesterol Cal: 20 mg/dL (ref 5–40)

## 2022-07-30 LAB — VITAMIN D 25 HYDROXY (VIT D DEFICIENCY, FRACTURES): Vit D, 25-Hydroxy: 20.6 ng/mL — ABNORMAL LOW (ref 30.0–100.0)

## 2022-07-30 LAB — B12 AND FOLATE PANEL
Folate: 11.2 ng/mL (ref >3.0)
Vitamin B-12: 1981 pg/mL — ABNORMAL HIGH (ref 232–1245)

## 2022-07-30 LAB — HEMOGLOBIN A1C
Est. average glucose Bld gHb Est-mCnc: 114 mg/dL
Hgb A1c MFr Bld: 5.6 % (ref 4.8–5.6)

## 2022-07-30 NOTE — Progress Notes (Signed)
Please let her know that the vitamin D is very low.  Normal is 30-100, and mental health we like it to be at least 50 and Aimee Hart is 20.6.  I will prescribe vitamin D 50,000 IUs, take 1 p.o. weekly and also over-the-counter 2000 IUs daily.  Will recheck vitamin D level in approximately 6 weeks. CBC with differential is completely normal. B12 is elevated, have her stop any B12 or B complex supplements.  Folate is normal.  TSH is normal at 1.5. Glucose is 110, creatinine 1.04 which are fairly elevated and not significant.  Hemoglobin A1c 5.6 which is at the top end of normal range. Cholesterol and triglycerides look good. No other necessary treatment at this time.

## 2022-07-31 NOTE — Progress Notes (Signed)
LVM to RC 

## 2022-08-01 NOTE — Progress Notes (Signed)
Noted  

## 2022-08-13 ENCOUNTER — Other Ambulatory Visit: Payer: Self-pay | Admitting: Physician Assistant

## 2022-08-13 ENCOUNTER — Other Ambulatory Visit (HOSPITAL_BASED_OUTPATIENT_CLINIC_OR_DEPARTMENT_OTHER): Payer: Self-pay

## 2022-08-13 MED ORDER — VITAMIN D (ERGOCALCIFEROL) 1.25 MG (50000 UNIT) PO CAPS
50000.0000 [IU] | ORAL_CAPSULE | ORAL | 0 refills | Status: DC
  Filled 2022-08-13: qty 12, 84d supply, fill #0

## 2022-08-14 ENCOUNTER — Encounter: Payer: Self-pay | Admitting: Family Medicine

## 2022-08-15 ENCOUNTER — Encounter (INDEPENDENT_AMBULATORY_CARE_PROVIDER_SITE_OTHER): Payer: Commercial Managed Care - PPO | Admitting: Family Medicine

## 2022-08-15 DIAGNOSIS — G4733 Obstructive sleep apnea (adult) (pediatric): Secondary | ICD-10-CM | POA: Diagnosis not present

## 2022-08-20 ENCOUNTER — Other Ambulatory Visit: Payer: Self-pay

## 2022-08-20 ENCOUNTER — Other Ambulatory Visit (HOSPITAL_COMMUNITY): Payer: Self-pay

## 2022-08-22 ENCOUNTER — Other Ambulatory Visit (HOSPITAL_BASED_OUTPATIENT_CLINIC_OR_DEPARTMENT_OTHER): Payer: Self-pay

## 2022-08-24 ENCOUNTER — Ambulatory Visit (INDEPENDENT_AMBULATORY_CARE_PROVIDER_SITE_OTHER): Payer: Commercial Managed Care - PPO | Admitting: Physician Assistant

## 2022-08-24 ENCOUNTER — Other Ambulatory Visit (HOSPITAL_COMMUNITY): Payer: Self-pay

## 2022-08-24 ENCOUNTER — Encounter: Payer: Self-pay | Admitting: Physician Assistant

## 2022-08-24 DIAGNOSIS — F331 Major depressive disorder, recurrent, moderate: Secondary | ICD-10-CM

## 2022-08-24 DIAGNOSIS — R454 Irritability and anger: Secondary | ICD-10-CM

## 2022-08-24 DIAGNOSIS — R7989 Other specified abnormal findings of blood chemistry: Secondary | ICD-10-CM | POA: Diagnosis not present

## 2022-08-24 DIAGNOSIS — R5381 Other malaise: Secondary | ICD-10-CM

## 2022-08-24 DIAGNOSIS — F411 Generalized anxiety disorder: Secondary | ICD-10-CM

## 2022-08-24 NOTE — Progress Notes (Addendum)
[facility]  Patient ID: Aimee Hart,  MRN: [id number]  PCP: Sonny Masters, FNP  Date of Evaluation: 08/24/2022 Time spent:20 minutes  Chief Complaint:  Chief Complaint   Anxiety; Depression; Follow-up    HISTORY/CURRENT STATUS: HPI For routine med check.  Mistake w/ Vit D, took 1 everyday for 7 days. Finished a few days ago. No SE.   Overall mood is ok. But she's not enjoying things like she did and is more irritable than nl. Probably circumstantial, work and life in general.  No extreme sadness, tearfulness, or feelings of hopelessness.  Sleeps well most of the time. ADLs and personal hygiene are normal.   Denies any changes in concentration, making decisions, or remembering things.  Appetite has not changed.  Denies suicidal or homicidal thoughts.  Patient denies increased energy with decreased need for sleep, increased talkativeness, racing thoughts, impulsivity or risky behaviors, increased spending, increased libido, grandiosity, paranoia, or hallucinations.  Denies dizziness, syncope, seizures, numbness, tingling, tremor, tics, unsteady gait, slurred speech, confusion. Denies muscle or joint pain, stiffness, or dystonia. Denies unexplained weight loss, frequent infections, or sores that heal slowly.  No polyphagia, polydipsia, or polyuria. Denies visual changes or paresthesias.   Individual Medical History/ Review of Systems: Changes? :No     Past medications for mental health diagnoses include: Zoloft, Prozac, Paxil, Abilify, Effexor, Celexa, Cymbalta, Lamictal, Ambien, melatonin, Wellbutrin SR, Trintellix, Viibryd, Cerefolin NAC (not sure if effective at all plus was expensive) Auvelity  Allergies: Patient has no known allergies.  Current Medications:  Current Outpatient Medications:    ALPRAZolam (XANAX) 0.5 MG tablet, Take 1 tablet (0.5 mg total) by mouth 2 (two) times daily as needed for anxiety., Disp: 30 tablet, Rfl: 4   Dextromethorphan-buPROPion ER  (AUVELITY) 45-105 MG TBCR, Take 1 tablet by mouth 2 (two) times daily., Disp: 180 tablet, Rfl: 0   estradiol (VIVELLE-DOT) 0.1 MG/24HR patch, Apply 1 patch twice a week by transdermal route., Disp: 8 patch, Rfl: 6   ibuprofen (ADVIL,MOTRIN) 200 MG tablet, Take 600 mg by mouth every 6 (six) hours as needed for headache, mild pain or moderate pain., Disp: , Rfl:    metroNIDAZOLE (METROCREAM) 0.75 % cream, Apply as directed to affected area twice a day; Use of SPF is recommended., Disp: 45 g, Rfl: 1   omeprazole (PRILOSEC) 10 MG capsule, Take 10 mg by mouth daily., Disp: , Rfl:    progesterone (PROMETRIUM) 100 MG capsule, Take 1 capsule every day by oral route at bedtime., Disp: 30 capsule, Rfl: 11   progesterone (PROMETRIUM) 100 MG capsule, Take 1 capsule every day by oral route at bedtime., Disp: 30 capsule, Rfl: 6   progesterone (PROMETRIUM) 100 MG capsule, Take 1 capsule (100 mg total) by mouth at bedtime., Disp: 30 capsule, Rfl: 6   Vilazodone HCl (VIIBRYD) 40 MG TABS, Take 1 tablet by mouth daily., Disp: 90 tablet, Rfl: 3   Vitamin D, Ergocalciferol, (DRISDOL) 1.25 MG (50000 UNIT) CAPS capsule, Take 1 capsule (50,000 Units total) by mouth every 7 (seven) days., Disp: 12 capsule, Rfl: 0   zolpidem (AMBIEN) 10 MG tablet, Take 1/2 - 1 tablet by mouth at bedtime as needed for sleep., Disp: 30 tablet, Rfl: 4   Cyanocobalamin (B-12 DOTS SL), Place under the tongue., Disp: , Rfl:    lurasidone (LATUDA) 20 MG TABS tablet, Take 1 tablet (20 mg total) by mouth daily with supper., Disp: 30 tablet, Rfl: 1 Medication Side Effects: none  Family Medical/ Social History: Changes?  No   MENTAL HEALTH EXAM:  There were no vitals taken for this visit.There is no height or weight on file to calculate BMI.  General Appearance: Casual, Neat and Well Groomed  Eye Contact:  Good  Speech:  Clear and Coherent and Normal Rate  Volume:  Normal  Mood:  Euthymic  Affect:  Congruent  Thought Process:  Goal Directed  and Descriptions of Associations: Circumstantial  Orientation:  Full (Time, Place, and Person)  Thought Content: Logical   Suicidal Thoughts:  No  Homicidal Thoughts:  No  Memory:  WNL  Judgement:  Good  Insight:  Good  Psychomotor Activity:  Normal  Concentration:  Concentration: Good and Attention Span: Good  Recall:  Good  Fund of Knowledge: Good  Language: Good  Assets:  Communication Skills Desire for Improvement Financial Resources/Insurance Housing Resilience Transportation Vocational/Educational  ADL's:  Intact  Cognition: WNL  Prognosis:  Good   Labs 07/27/2022 CBC with differential completely normal CMP, glucose 110, creatinine 1.04 otherwise completely normal Lipid panel normal except LDL 128 Hemoglobin A1c 5.6 TSH 1.5 Vitamin D 20.6 B12 1,981, folate 11.2  GeneSight results on chart, scanned under labs.   ECT-MADRS    Flowsheet Row [facility] Visit [facility] 09/22/2021 in [facility] Office Visit from 08/22/2021 in [facility]  MADRS Total Score 4 36      GAD-7    Flowsheet Row Office Visit from 05/05/2021 in [facility] Office Visit from 12/29/2020 in [facility] Office Visit from 05/14/2018 in [facility]  Total GAD-7 Score 1 1 0      PHQ2-9    Flowsheet Row Office Visit from 05/05/2021 in [facility] Office Visit from 12/29/2020 in [facility] Office Visit from 12/25/2018 in [facility] Office Visit from 05/14/2018 in [facility] Office Visit from 05/08/2018 in [facility]  PHQ-2 Total Score 2 1 0 0 0  PHQ-9 Total Score 4 3 -- 0 --      Flowsheet Row ED from 05/30/2021 in [facility] Department at [facility]  C-SSRS RISK  CATEGORY No Risk      DIAGNOSES:    ICD-10-CM   1. Low vitamin D level  R79.89 VITAMIN D 25 Hydroxy (Vit-D Deficiency, Fractures)    2. Generalized anxiety disorder  F41.1     3. Irritability and anger  R45.4     4. Major depressive disorder, recurrent episode, moderate (HCC)  F33.1       Receiving Psychotherapy: Yes     RECOMMENDATIONS:  PDMP was reviewed.  Ambien filled 07/30/2022. Xanax filled 04/25/2022. I provided 20 minutes of face to face time during this encounter, including time spent before and after the visit in records review, medical decision making, counseling pertinent to today's visit, and charting.   Disc her sx. Recommend healthy lifestyle choices, exercise, healthy diet, take time for herself daily. No changes recommended until we get Vit D level back. May need to consider adding Latuda, Vraylar or Rexulti.   Continue Xanax 0.5 mg, 1 p.o. twice daily as needed. Continue Auvelity 45/105 mg, 1 p.o. twice daily.   Continue Viibryd 40 mg p.o. daily.   Continue Ambien 10 mg, 1/2-1 nightly as needed sleep. Continue counseling. Get Vit D drawn in approx 2 weeks.  Return in 6  weeks.  Melony Overly, PA-C

## 2022-08-30 ENCOUNTER — Other Ambulatory Visit: Payer: Self-pay | Admitting: Physician Assistant

## 2022-08-30 ENCOUNTER — Other Ambulatory Visit (HOSPITAL_BASED_OUTPATIENT_CLINIC_OR_DEPARTMENT_OTHER): Payer: Self-pay

## 2022-08-30 MED ORDER — LURASIDONE HCL 20 MG PO TABS
20.0000 mg | ORAL_TABLET | Freq: Every day | ORAL | 1 refills | Status: DC
  Filled 2022-08-30: qty 30, 30d supply, fill #0
  Filled 2022-09-30: qty 30, 30d supply, fill #1

## 2022-09-03 ENCOUNTER — Encounter (INDEPENDENT_AMBULATORY_CARE_PROVIDER_SITE_OTHER): Payer: Self-pay | Admitting: Internal Medicine

## 2022-09-03 ENCOUNTER — Ambulatory Visit (INDEPENDENT_AMBULATORY_CARE_PROVIDER_SITE_OTHER): Payer: Commercial Managed Care - PPO | Admitting: Internal Medicine

## 2022-09-03 VITALS — BP 117/75 | HR 80 | Temp 98.2°F | Ht 65.0 in | Wt 217.0 lb

## 2022-09-03 DIAGNOSIS — Z6836 Body mass index (BMI) 36.0-36.9, adult: Secondary | ICD-10-CM

## 2022-09-03 DIAGNOSIS — R7303 Prediabetes: Secondary | ICD-10-CM | POA: Diagnosis not present

## 2022-09-03 DIAGNOSIS — Z9884 Bariatric surgery status: Secondary | ICD-10-CM | POA: Diagnosis not present

## 2022-09-03 DIAGNOSIS — E66812 Obesity, class 2: Secondary | ICD-10-CM | POA: Insufficient documentation

## 2022-09-03 DIAGNOSIS — E7800 Pure hypercholesterolemia, unspecified: Secondary | ICD-10-CM

## 2022-09-03 DIAGNOSIS — Z0289 Encounter for other administrative examinations: Secondary | ICD-10-CM

## 2022-09-03 NOTE — Progress Notes (Signed)
Office: [phone]  /  Fax: [phone]   Initial Visit  Aimee Hart was seen in clinic today to evaluate for obesity. She is interested in losing weight to improve overall health and reduce the risk of weight related complications. She presents today to review program treatment options, initial physical assessment, and evaluation.     She was referred by: Friend or Family  When asked what else they would like to accomplish? She states: Adopt healthier eating patterns, Improve existing medical conditions, Improve quality of life, and Improve self-confidence  Weight history: Gained weight this year and affecting knee. Snacks often and craves sweets.  When asked how has your weight affected you? She states: Has affected self-esteem, Contributed to medical problems, Contributed to orthopedic problems or mobility issues, and Has affected mood   Some associated conditions: OSA  Contributing factors: Family history, Disruption of circadian rhythm, Nutritional, Reduced physical activity, Eating patterns, Mental health problems, and Menopause  Weight promoting medications identified: Contraceptives or hormonal therapy  Current nutrition plan: None  Current level of physical activity: Limited due to chronic pain or orthopedic problems and Other: going to gym  Current or previous pharmacotherapy: Other: contrave 3 months no results, wegovy 3 months stopped due to GI SE  Response to medication: Had side effects so it was discontinued   Past medical history includes:   Past Medical History:  Diagnosis Date   Depression    GERD (gastroesophageal reflux disease)    Hypercholesteremia    Sleep apnea      Objective:   BP 117/75   Pulse 80   Temp 98.2 F (36.8 C)   Ht 5\' 5"  (1.651 m)   Wt 217 lb (98.4 kg)   SpO2 95%   BMI 36.11 kg/m  She was weighed on the bioimpedance scale: Body mass index is 36.11 kg/m.  Peak Weight:237 , Body Fat%:45, Visceral Fat Rating:13, Weight  trend over the last 12 months: Unchanged  General:  Alert, oriented and cooperative. Patient is in no acute distress.  Respiratory: Normal respiratory effort, no problems with respiration noted   Gait: able to ambulate independently  Mental Status: Normal mood and affect. Normal behavior. Normal judgment and thought content.   DIAGNOSTIC DATA REVIEWED:  BMET    Component Value Date/Time   NA 137 07/27/2022 1019   K 4.6 07/27/2022 1019   CL 105 07/27/2022 1019   CO2 21 07/27/2022 1019   GLUCOSE 110 (H) 07/27/2022 1019   GLUCOSE 95 05/30/2021 1400   BUN 17 07/27/2022 1019   CREATININE 1.04 (H) 07/27/2022 1019   CALCIUM 8.7 07/27/2022 1019   GFRNONAA >60 05/30/2021 1400   GFRAA 87 12/25/2018 1224   Lab Results  Component Value Date   HGBA1C 5.6 07/27/2022   No results found for: "INSULIN" CBC    Component Value Date/Time   WBC 4.5 07/27/2022 1019   WBC 5.2 05/30/2021 1400   RBC 4.78 07/27/2022 1019   RBC 4.95 05/30/2021 1400   HGB 12.9 07/27/2022 1019   HCT 38.7 07/27/2022 1019   PLT 277 07/27/2022 1019   MCV 81 07/27/2022 1019   MCH 27.0 07/27/2022 1019   MCH 26.5 05/30/2021 1400   MCHC 33.3 07/27/2022 1019   MCHC 32.5 05/30/2021 1400   RDW 14.3 07/27/2022 1019   Iron/TIBC/Ferritin/ %Sat No results found for: "IRON", "TIBC", "FERRITIN", "IRONPCTSAT" Lipid Panel     Component Value Date/Time   CHOL 195 07/27/2022 1019   TRIG 111 07/27/2022 1019  HDL 47 07/27/2022 1019   CHOLHDL 4.1 07/27/2022 1019   LDLCALC 128 (H) 07/27/2022 1019   Hepatic Function Panel     Component Value Date/Time   PROT 6.2 07/27/2022 1019   ALBUMIN 4.0 07/27/2022 1019   AST 13 07/27/2022 1019   ALT 9 07/27/2022 1019   ALKPHOS 65 07/27/2022 1019   BILITOT 0.3 07/27/2022 1019   BILIDIR <0.1 05/30/2021 1528   IBILI NOT CALCULATED 05/30/2021 1528      Component Value Date/Time   TSH 1.510 07/27/2022 1019     Assessment and Plan:   LAP-BAND surgery status Assessment &  Plan: Had good initial results but experienced significant restriction.  Band has been deflated at present time not experiencing any sort of restrictive symptomatology.   Pure hypercholesterolemia Assessment & Plan: LDL is not at goal. Elevated LDL may be secondary to nutrition, genetics and spillover effect from excess adiposity. Recommended LDL goal is <70 to reduce the risk of fatty streaks and the progression to obstructive ASCVD in the future. Her 10 year risk is: The 10-year ASCVD risk score (Arnett DK, et al., 2019) is: 2.3%  Lab Results  Component Value Date   CHOL 195 07/27/2022   HDL 47 07/27/2022   LDLCALC 128 (H) 07/27/2022   TRIG 111 07/27/2022   CHOLHDL 4.1 07/27/2022    Continue weight loss therapy, losing 10% or more of body weight may improve condition. Also advised to reduce saturated fats in diet to less than 10% of daily calories.  She does not benefit from statin therapy at present time      Prediabetes Assessment & Plan: Based on a fasting blood glucose of 110.  Her hemoglobin A1c was 5.6.  We will check a fasting blood sugar and insulin levels to assess degree of insulin resistance.  Losing 10% of body weight and increasing physical activity to 150 minutes a week may reduce the risk of progression.  She may also be a candidate for pharmacoprophylaxis with metformin or incretin mimetic.   Class 2 severe obesity with serious comorbidity and body mass index (BMI) of 36.0 to 36.9 in adult, unspecified obesity type [facility]) Assessment & Plan: We reviewed weight, biometrics, associated medical conditions and contributing factors with patient. She would benefit from weight loss therapy via a modified calorie, low-carb, high-protein nutritional plan tailored to their REE (resting energy expenditure) which will be determined by indirect calorimetry.  We will also assess for cardiometabolic risk and nutritional derangements via fasting serologies at her next  appointment.         Obesity Treatment / Action Plan:  Patient will work on garnering support from family and friends to begin weight loss journey. Will work on eliminating or reducing the presence of highly palatable, calorie dense foods in the home. Will complete provided nutritional and psychosocial assessment questionnaire before the next appointment. Will be scheduled for indirect calorimetry to determine resting energy expenditure in a fasting state.  This will allow [place] to create a reduced calorie, high-protein meal plan to promote loss of fat mass while preserving muscle mass. Counseled on the health benefits of losing 5%-15% of total body weight. Was counseled on nutritional approaches to weight loss and benefits of reducing processed foods and consuming plant-based foods and high quality protein as part of nutritional weight management. Was counseled on pharmacotherapy and role as an adjunct in weight management.   Obesity Education Performed Today:  She was weighed on the bioimpedance scale and results were discussed and  documented in the synopsis.  We discussed obesity as a disease and the importance of a more detailed evaluation of all the factors contributing to the disease.  We discussed the importance of long term lifestyle changes which include nutrition, exercise and behavioral modifications as well as the importance of customizing this to her specific health and social needs.  We discussed the benefits of reaching a healthier weight to alleviate the symptoms of existing conditions and reduce the risks of the biomechanical, metabolic and psychological effects of obesity.  Aimee Hart appears to be in the action stage of change and states they are ready to start intensive lifestyle modifications and behavioral modifications.  30 minutes was spent today on this visit including the above counseling, pre-visit chart review, and post-visit documentation.  Reviewed by  clinician on day of visit: allergies, medications, problem list, medical history, surgical history, family history, social history, and previous encounter notes pertinent to obesity diagnosis.   Worthy Rancher, MD

## 2022-09-03 NOTE — Assessment & Plan Note (Signed)
We reviewed weight, biometrics, associated medical conditions and contributing factors with patient. She would benefit from weight loss therapy via a modified calorie, low-carb, high-protein nutritional plan tailored to their REE (resting energy expenditure) which will be determined by indirect calorimetry.  We will also assess for cardiometabolic risk and nutritional derangements via fasting serologies at her next appointment. 

## 2022-09-03 NOTE — Assessment & Plan Note (Signed)
LDL is not at goal. Elevated LDL may be secondary to nutrition, genetics and spillover effect from excess adiposity. Recommended LDL goal is <70 to reduce the risk of fatty streaks and the progression to obstructive ASCVD in the future. Her 10 year risk is: The 10-year ASCVD risk score (Arnett DK, et al., 2019) is: 2.3%  Lab Results  Component Value Date   CHOL 195 07/27/2022   HDL 47 07/27/2022   LDLCALC 128 (H) 07/27/2022   TRIG 111 07/27/2022   CHOLHDL 4.1 07/27/2022    Continue weight loss therapy, losing 10% or more of body weight may improve condition. Also advised to reduce saturated fats in diet to less than 10% of daily calories.  She does not benefit from statin therapy at present time

## 2022-09-03 NOTE — Assessment & Plan Note (Signed)
Based on a fasting blood glucose of 110.  Her hemoglobin A1c was 5.6.  We will check a fasting blood sugar and insulin levels to assess degree of insulin resistance.  Losing 10% of body weight and increasing physical activity to 150 minutes a week may reduce the risk of progression.  She may also be a candidate for pharmacoprophylaxis with metformin or incretin mimetic.

## 2022-09-03 NOTE — Assessment & Plan Note (Addendum)
Had good initial results but experienced significant restriction.  Band has been deflated at present time not experiencing any sort of restrictive symptomatology.

## 2022-09-05 ENCOUNTER — Other Ambulatory Visit (HOSPITAL_BASED_OUTPATIENT_CLINIC_OR_DEPARTMENT_OTHER): Payer: Self-pay

## 2022-09-10 ENCOUNTER — Other Ambulatory Visit: Payer: Self-pay | Admitting: Physician Assistant

## 2022-09-10 DIAGNOSIS — R7989 Other specified abnormal findings of blood chemistry: Secondary | ICD-10-CM | POA: Diagnosis not present

## 2022-09-11 LAB — VITAMIN D 25 HYDROXY (VIT D DEFICIENCY, FRACTURES): Vit D, 25-Hydroxy: 31.7 ng/mL (ref 30.0–100.0)

## 2022-09-12 NOTE — Progress Notes (Signed)
Please let her know the Vit D level is still low, barely in nl range. Recommend she take the weekly dose of Vit D, and we'll repeat level in 6-8 weeks.

## 2022-09-14 DIAGNOSIS — G4733 Obstructive sleep apnea (adult) (pediatric): Secondary | ICD-10-CM | POA: Diagnosis not present

## 2022-09-19 ENCOUNTER — Encounter (INDEPENDENT_AMBULATORY_CARE_PROVIDER_SITE_OTHER): Payer: Self-pay | Admitting: Internal Medicine

## 2022-09-19 ENCOUNTER — Ambulatory Visit (INDEPENDENT_AMBULATORY_CARE_PROVIDER_SITE_OTHER): Payer: Commercial Managed Care - PPO | Admitting: Internal Medicine

## 2022-09-19 VITALS — BP 108/75 | HR 74 | Temp 98.6°F | Ht 65.0 in | Wt 220.0 lb

## 2022-09-19 DIAGNOSIS — E7800 Pure hypercholesterolemia, unspecified: Secondary | ICD-10-CM

## 2022-09-19 DIAGNOSIS — G4733 Obstructive sleep apnea (adult) (pediatric): Secondary | ICD-10-CM | POA: Diagnosis not present

## 2022-09-19 DIAGNOSIS — F32A Depression, unspecified: Secondary | ICD-10-CM | POA: Diagnosis not present

## 2022-09-19 DIAGNOSIS — Z9884 Bariatric surgery status: Secondary | ICD-10-CM | POA: Diagnosis not present

## 2022-09-19 DIAGNOSIS — Z6836 Body mass index (BMI) 36.0-36.9, adult: Secondary | ICD-10-CM

## 2022-09-19 DIAGNOSIS — Z1331 Encounter for screening for depression: Secondary | ICD-10-CM

## 2022-09-19 DIAGNOSIS — R0602 Shortness of breath: Secondary | ICD-10-CM | POA: Insufficient documentation

## 2022-09-19 DIAGNOSIS — R7303 Prediabetes: Secondary | ICD-10-CM | POA: Diagnosis not present

## 2022-09-19 DIAGNOSIS — R5383 Other fatigue: Secondary | ICD-10-CM | POA: Diagnosis not present

## 2022-09-19 NOTE — Assessment & Plan Note (Signed)
Had good initial results but experienced significant restriction.  Band has been deflated at present time not experiencing any sort of restrictive symptomatology.  Continue to monitor

## 2022-09-19 NOTE — Progress Notes (Signed)
Chief Complaint:   OBESITY Aimee Hart (MR# [id number]) is a 57 y.o. female who presents for evaluation and treatment of obesity and related comorbidities. Current BMI is Body mass index is 36.61 kg/m. Aimee Hart has been struggling with her weight for many years and has been unsuccessful in either losing weight, maintaining weight loss, or reaching her healthy weight goal.  Aimee Hart is currently in the action stage of change and ready to dedicate time achieving and maintaining a healthier weight. Aimee Hart is interested in becoming our patient and working on intensive lifestyle modifications including (but not limited to) diet and exercise for weight loss.  Ava's habits were reviewed today and are as follows: her desired weight loss is 50 lbs, she has been heavy most of her life, she started gaining weight in her mid 30's, her heaviest weight ever was 237 pounds, she has significant food cravings issues, she snacks frequently in the evenings, she frequently makes poor food choices, she has problems with excessive hunger, she frequently eats larger portions than normal, and she struggles with emotional eating.  Depression Screen Aimee Food and Mood (modified PHQ-9) score was 18.   Subjective:   1. Other fatigue Aimee Hart admits to daytime somnolence and admits to waking up still tired. Patient has a history of symptoms of daytime fatigue, morning fatigue, and morning headache. Aimee Hart generally gets 5 or 6 hours of sleep per night, and states that she has nightime awakenings. Snoring is present. Apneic episodes are present. Epworth Sleepiness Score is 8.   2. SOB (shortness of breath) on exertion Aimee Hart notes increasing shortness of breath with exercising and seems to be worsening over time with weight gain. She notes getting out of breath sooner with activity than she used to. This has not gotten worse recently. Aimee Hart denies shortness of breath at rest or orthopnea.  3. LAP-BAND surgery  status Had good initial results but experienced significant restriction.  Band has been deflated at present time not experiencing any sort of restrictive symptomatology.  4. Pure hypercholesterolemia LDL is not at goal. Elevated LDL may be secondary to nutrition, genetics and spillover effect from excess adiposity. Recommended LDL goal is <70 to reduce the risk of fatty streaks and the progression to obstructive ASCVD in the future. Her 10 year risk is: The 10-year ASCVD risk score (Arnett DK, et al., 2019) is: 1.9%  Lab Results  Component Value Date   CHOL 195 07/27/2022   HDL 47 07/27/2022   LDLCALC 128 (H) 07/27/2022   TRIG 111 07/27/2022   CHOLHDL 4.1 07/27/2022   5. OSA on CPAP On CPAP with reported good compliance.  6. Prediabetes Her most recent hemoglobin A1c is 5.6 she did have a fasting blood glucose above 100 which we will repeat today and also check insulin levels.  Assessment/Plan:   1. Other fatigue Girl does feel that her weight is causing her energy to be lower than it should be. Fatigue may be related to obesity, depression or many other causes. Labs will be ordered, and in the meanwhile, Aimee Hart will focus on self care including making healthy food choices, increasing physical activity and focusing on stress reduction.  - EKG 12-Lead  2. SOB (shortness of breath) on exertion Yakita does feel that she gets out of breath more easily that she used to when she exercises. Aimee Hart shortness of breath appears to be obesity related and exercise induced. She has agreed to work on weight loss and gradually increase exercise to  treat her exercise induced shortness of breath. Will continue to monitor closely.  3. LAP-BAND surgery status Continue to monitor.  4. Pure hypercholesterolemia Continue weight loss therapy, losing 10% or more of body weight may improve condition. Also advised to reduce saturated fats in diet to less than 10% of daily calories.  She does not benefit  from statin therapy at present time.  5. OSA on CPAP Continue PAP therapy. Losing 15% or more of body weight may improve AHI.   6. Prediabetes Losing 7 to 10% of body weight will improve condition also increasing physical activity to 150 minutes a week.  She may also be a candidate for pharmacoprophylaxis with metformin or incretin therapy.  - Insulin, random - Glucose, fasting; Future  7. Depression screening Aimee Hart had a positive depression screening. Depression is commonly associated with obesity and often results in emotional eating behaviors. We will monitor this closely and work on CBT to help improve the non-hunger eating patterns. Referral to Psychology may be required if no improvement is seen as she continues in our clinic.  8. Class 2 severe obesity with serious comorbidity and body mass index (BMI) of 36.0 to 36.9 in adult, unspecified obesity type (HCC) Aimee Hart is currently in the action stage of change and her goal is to continue with weight loss efforts. I recommend Aimee Hart begin the structured treatment plan as follows:  She has agreed to the Category 2 Plan.  Exercise goals: All adults should avoid inactivity. Some physical activity is better than none, and adults who participate in any amount of physical activity gain some health benefits.   Behavioral modification strategies: increasing lean protein intake, decreasing simple carbohydrates, increasing vegetables, increasing water intake, decreasing liquid calories, increasing high fiber foods, decreasing eating out, no skipping meals, meal planning and cooking strategies, keeping healthy foods in the home, better snacking choices, and planning for success.  She was informed of the importance of frequent follow-up visits to maximize her success with intensive lifestyle modifications for her multiple health conditions. She was informed we would discuss her lab results at her next visit unless there is a critical issue that needs  to be addressed sooner. Aimee Hart agreed to keep her next visit at the agreed upon time to discuss these results.  Objective:   Blood pressure 108/75, pulse 74, temperature 98.6 F (37 C), height 5\' 5"  (1.651 m), weight 220 lb (99.8 kg), SpO2 97 %. Body mass index is 36.61 kg/m.  EKG: Normal sinus rhythm, rate 76 BPM.  Indirect Calorimeter completed today shows a VO2 of 228 and a REE of 1570.  Her calculated basal metabolic rate is 9562 thus her basal metabolic rate is worse than expected.  General: Cooperative, alert, well developed, in no acute distress. HEENT: Conjunctivae and lids unremarkable. Cardiovascular: Regular rhythm.  Lungs: Normal work of breathing. Neurologic: No focal deficits.   Lab Results  Component Value Date   CREATININE 1.04 (H) 07/27/2022   BUN 17 07/27/2022   NA 137 07/27/2022   K 4.6 07/27/2022   CL 105 07/27/2022   CO2 21 07/27/2022   Lab Results  Component Value Date   ALT 9 07/27/2022   AST 13 07/27/2022   ALKPHOS 65 07/27/2022   BILITOT 0.3 07/27/2022   Lab Results  Component Value Date   HGBA1C 5.6 07/27/2022   Lab Results  Component Value Date   INSULIN 7.3 09/19/2022   Lab Results  Component Value Date   TSH 1.510 07/27/2022   Lab Results  Component Value Date   CHOL 195 07/27/2022   HDL 47 07/27/2022   LDLCALC 128 (H) 07/27/2022   TRIG 111 07/27/2022   CHOLHDL 4.1 07/27/2022   Lab Results  Component Value Date   WBC 4.5 07/27/2022   HGB 12.9 07/27/2022   HCT 38.7 07/27/2022   MCV 81 07/27/2022   PLT 277 07/27/2022   No results found for: "IRON", "TIBC", "FERRITIN"  Attestation Statements:   Reviewed by clinician on day of visit: allergies, medications, problem list, medical history, surgical history, family history, social history, and previous encounter notes.  Time spent on visit including pre-visit chart review and post-visit charting and care was 40 minutes.   Trude Mcburney, am acting as transcriptionist for  Worthy Rancher, MD.  I have reviewed the above documentation for accuracy and completeness, and I agree with the above. -Worthy Rancher, MD

## 2022-09-19 NOTE — Assessment & Plan Note (Addendum)
Her most recent hemoglobin A1c is 5.6 she did have a fasting blood glucose above 100 which we will repeat today and also check insulin levels.  Losing 7 to 10% of body weight will improve condition also increasing physical activity to 150 minutes a week.  She may also be a candidate for pharmacoprophylaxis with metformin or incretin therapy.

## 2022-09-19 NOTE — Assessment & Plan Note (Signed)
On CPAP with reported good compliance. Continue PAP therapy. Losing 15% or more of body weight may improve AHI.    

## 2022-09-19 NOTE — Assessment & Plan Note (Signed)
LDL is not at goal. Elevated LDL may be secondary to nutrition, genetics and spillover effect from excess adiposity. Recommended LDL goal is <70 to reduce the risk of fatty streaks and the progression to obstructive ASCVD in the future. Her 10 year risk is: The 10-year ASCVD risk score (Arnett DK, et al., 2019) is: 1.9%  Lab Results  Component Value Date   CHOL 195 07/27/2022   HDL 47 07/27/2022   LDLCALC 128 (H) 07/27/2022   TRIG 111 07/27/2022   CHOLHDL 4.1 07/27/2022    Continue weight loss therapy, losing 10% or more of body weight may improve condition. Also advised to reduce saturated fats in diet to less than 10% of daily calories.  She does not benefit from statin therapy at present time

## 2022-09-20 LAB — INSULIN, RANDOM: INSULIN: 7.3 u[IU]/mL (ref 2.6–24.9)

## 2022-09-25 ENCOUNTER — Other Ambulatory Visit: Payer: Self-pay

## 2022-09-25 ENCOUNTER — Encounter (HOSPITAL_BASED_OUTPATIENT_CLINIC_OR_DEPARTMENT_OTHER): Payer: Self-pay | Admitting: Pharmacist

## 2022-09-25 ENCOUNTER — Other Ambulatory Visit (HOSPITAL_BASED_OUTPATIENT_CLINIC_OR_DEPARTMENT_OTHER): Payer: Self-pay

## 2022-10-03 ENCOUNTER — Encounter (INDEPENDENT_AMBULATORY_CARE_PROVIDER_SITE_OTHER): Payer: Self-pay | Admitting: Internal Medicine

## 2022-10-03 ENCOUNTER — Ambulatory Visit (INDEPENDENT_AMBULATORY_CARE_PROVIDER_SITE_OTHER): Payer: Commercial Managed Care - PPO | Admitting: Internal Medicine

## 2022-10-03 VITALS — BP 120/78 | HR 84 | Temp 98.7°F | Ht 65.0 in | Wt 211.0 lb

## 2022-10-03 DIAGNOSIS — G4709 Other insomnia: Secondary | ICD-10-CM | POA: Diagnosis not present

## 2022-10-03 DIAGNOSIS — R7303 Prediabetes: Secondary | ICD-10-CM

## 2022-10-03 DIAGNOSIS — E7800 Pure hypercholesterolemia, unspecified: Secondary | ICD-10-CM | POA: Diagnosis not present

## 2022-10-03 DIAGNOSIS — Z6836 Body mass index (BMI) 36.0-36.9, adult: Secondary | ICD-10-CM

## 2022-10-03 DIAGNOSIS — Z6835 Body mass index (BMI) 35.0-35.9, adult: Secondary | ICD-10-CM | POA: Diagnosis not present

## 2022-10-03 DIAGNOSIS — Z87891 Personal history of nicotine dependence: Secondary | ICD-10-CM | POA: Diagnosis not present

## 2022-10-03 DIAGNOSIS — G4733 Obstructive sleep apnea (adult) (pediatric): Secondary | ICD-10-CM | POA: Diagnosis not present

## 2022-10-03 DIAGNOSIS — E66812 Obesity, class 2: Secondary | ICD-10-CM

## 2022-10-03 NOTE — Assessment & Plan Note (Signed)
She had a fasting blood glucose of 109 but her insulin levels and hemoglobin A1c are normal.  I recommend repeating fasting blood glucose in 6 months.  Losing 10% of body weight and increasing physical activity to 150 minutes a week will reduce the risk of progression.

## 2022-10-03 NOTE — Progress Notes (Addendum)
Office: [phone]  /  Fax: [phone]  WEIGHT SUMMARY AND BIOMETRICS  Vitals Temp: 98.7 F (37.1 C) BP: 120/78 Pulse Rate: 84 SpO2: 98 %   Anthropometric Measurements Height: 5\' 5"  (1.651 m) Weight: 211 lb (95.7 kg) BMI (Calculated): 35.11 Weight at Last Visit: 220 lb Weight Lost Since Last Visit: 9 lb Starting Weight: 220 lb Peak Weight: 237 lb   Body Composition  Body Fat %: 44.6 % Fat Mass (lbs): 94.2 lbs Muscle Mass (lbs): 111 lbs Total Body Water (lbs): 81.8 lbs Visceral Fat Rating : 12    No data recorded Today's Visit #: 2  Starting Date: 09/19/22  The 10-year ASCVD risk score (Arnett DK, et al., 2019) is: 2.4%  HPI  Chief Complaint: OBESITY  Aimee Hart is here to discuss her progress with her obesity treatment plan. She is on the the Category 2 Plan and states she is following her eating plan approximately 80 % of the time. She states she is not exercising.  Interval History:  Since last office visit she has lost 9 lbs. She reports good adherence to reduced calorie nutritional plan. She has been working on reading food labels, increasing protein intake at every meal, eating more vegetables, drinking more water, making healthier choices, and begun to exercise Denies problems with appetite and hunger signals.  Denies problems with satiety and satiation.  Denies problems with eating patterns and portion control.  Denies abnormal cravings. Denies feeling deprived or restricted.   Barriers identified: work schedule and chronic insomnia .   Pharmacotherapy for weight loss: She is currently taking no anti-obesity medication.    ASSESSMENT AND PLAN  TREATMENT PLAN FOR OBESITY:  Recommended Dietary Goals  [name] is currently in the action stage of change. As such, her goal is to continue weight management plan. She has agreed to: continue current plan  Behavioral Intervention  We discussed the following Behavioral Modification Strategies  today: increasing lean protein intake, decreasing simple carbohydrates , increasing vegetables, increasing lower glycemic fruits, increasing fiber rich foods, increasing water intake, continue to practice mindfulness when eating, and planning for success.  Additional resources provided today: Handout on increasing daily activity and exercise goal setting  Recommended Physical Activity Goals  [name] has been advised to work up to 150 minutes of moderate intensity aerobic activity a week and strengthening exercises 2-3 times per week for cardiovascular health, weight loss maintenance and preservation of muscle mass.   She has agreed to :  Start strengthening exercises with a goal of 2-3 sessions a week , Start aerobic activity with a goal of 150 minutes a week at moderate intensity. , and Increase physical activity in their day and reduce sedentary time (increase NEAT).  Pharmacotherapy We discussed various medication options to help [name] with her weight loss efforts and we both agreed to : continue with nutritional and behavioral strategies  ASSOCIATED CONDITIONS ADDRESSED TODAY  Former smoker Assessment & Plan: Reviewed candidacy for lung cancer screening she has a 10-year pack history and does not qualify.  She remains abstinent.   Pure hypercholesterolemia Assessment & Plan: LDL is not at goal. Elevated LDL may be secondary to nutrition, genetics and spillover effect from excess adiposity.. Her 10 year risk is: The 10-year ASCVD risk score (Arnett DK, et al., 2019) is: 2.4% so she does not benefit from statin therapy at present time.  Lab Results  Component Value Date   CHOL 195 07/27/2022   HDL 47 07/27/2022   LDLCALC 128 (H) 07/27/2022  TRIG 111 07/27/2022   CHOLHDL 4.1 07/27/2022    Continue weight loss therapy, losing 10% or more of body weight may improve condition. Also advised to reduce saturated fats in diet to less than 10% of daily calories.  She does not benefit from  statin therapy at present time         OSA on CPAP Assessment & Plan: On CPAP with reported good compliance. Continue PAP therapy. Losing 15% or more of body weight may improve AHI.      Prediabetes Assessment & Plan: She had a fasting blood glucose of 109 but her insulin levels and hemoglobin A1c are normal.  I recommend repeating fasting blood glucose in 6 months.  Losing 10% of body weight and increasing physical activity to 150 minutes a week will reduce the risk of progression.   Class 2 severe obesity with serious comorbidity and body mass index (BMI) of 36.0 to 36.9 in adult, unspecified obesity type (HCC)  Other insomnia Assessment & Plan: Encourage patient to do a self-guided cognitive behavioral therapy program for insomnia available online.  She may also try magnesium glycinate to promote sleep and reduce reliance on hypnotics.     PHYSICAL EXAM:  Blood pressure 120/78, pulse 84, temperature 98.7 F (37.1 C), height 5\' 5"  (1.651 m), weight 211 lb (95.7 kg), last menstrual period 10/03/2022, SpO2 98 %. Body mass index is 35.11 kg/m.  General: She is overweight, cooperative, alert, well developed, and in no acute distress. PSYCH: Has normal mood, affect and thought process.   HEENT: EOMI, sclerae are anicteric. Lungs: Normal breathing effort, no conversational dyspnea. Extremities: No edema.  Neurologic: No gross sensory or motor deficits. No tremors or fasciculations noted.    DIAGNOSTIC DATA REVIEWED:  BMET    Component Value Date/Time   NA 137 07/27/2022 1019   K 4.6 07/27/2022 1019   CL 105 07/27/2022 1019   CO2 21 07/27/2022 1019   GLUCOSE 110 (H) 07/27/2022 1019   GLUCOSE 95 05/30/2021 1400   BUN 17 07/27/2022 1019   CREATININE 1.04 (H) 07/27/2022 1019   CALCIUM 8.7 07/27/2022 1019   GFRNONAA >60 05/30/2021 1400   GFRAA 87 12/25/2018 1224   Lab Results  Component Value Date   HGBA1C 5.6 07/27/2022   Lab Results  Component Value Date    INSULIN 7.3 09/19/2022   Lab Results  Component Value Date   TSH 1.510 07/27/2022   CBC    Component Value Date/Time   WBC 4.5 07/27/2022 1019   WBC 5.2 05/30/2021 1400   RBC 4.78 07/27/2022 1019   RBC 4.95 05/30/2021 1400   HGB 12.9 07/27/2022 1019   HCT 38.7 07/27/2022 1019   PLT 277 07/27/2022 1019   MCV 81 07/27/2022 1019   MCH 27.0 07/27/2022 1019   MCH 26.5 05/30/2021 1400   MCHC 33.3 07/27/2022 1019   MCHC 32.5 05/30/2021 1400   RDW 14.3 07/27/2022 1019   Iron Studies No results found for: "IRON", "TIBC", "FERRITIN", "IRONPCTSAT" Lipid Panel     Component Value Date/Time   CHOL 195 07/27/2022 1019   TRIG 111 07/27/2022 1019   HDL 47 07/27/2022 1019   CHOLHDL 4.1 07/27/2022 1019   LDLCALC 128 (H) 07/27/2022 1019   Hepatic Function Panel     Component Value Date/Time   PROT 6.2 07/27/2022 1019   ALBUMIN 4.0 07/27/2022 1019   AST 13 07/27/2022 1019   ALT 9 07/27/2022 1019   ALKPHOS 65 07/27/2022 1019   BILITOT 0.3  07/27/2022 1019   BILIDIR <0.1 05/30/2021 1528   IBILI NOT CALCULATED 05/30/2021 1528      Component Value Date/Time   TSH 1.510 07/27/2022 1019   Nutritional Lab Results  Component Value Date   VD25OH 31.7 09/10/2022   VD25OH 20.6 (L) 07/27/2022     Return in about 2 weeks (around 10/17/2022) for For Weight Mangement with Dr. Rikki Spearing.Marland Kitchen She was informed of the importance of frequent follow up visits to maximize her success with intensive lifestyle modifications for her multiple health conditions.   ATTESTASTION STATEMENTS:  Reviewed by clinician on day of visit: allergies, medications, problem list, medical history, surgical history, family history, social history, and previous encounter notes.     Worthy Rancher, MD

## 2022-10-03 NOTE — Assessment & Plan Note (Signed)
Encourage patient to do a self-guided cognitive behavioral therapy program for insomnia available online.  She may also try magnesium glycinate to promote sleep and reduce reliance on hypnotics.

## 2022-10-03 NOTE — Assessment & Plan Note (Signed)
LDL is not at goal. Elevated LDL may be secondary to nutrition, genetics and spillover effect from excess adiposity.. Her 10 year risk is: The 10-year ASCVD risk score (Arnett DK, et al., 2019) is: 2.4% so she does not benefit from statin therapy at present time.  Lab Results  Component Value Date   CHOL 195 07/27/2022   HDL 47 07/27/2022   LDLCALC 128 (H) 07/27/2022   TRIG 111 07/27/2022   CHOLHDL 4.1 07/27/2022    Continue weight loss therapy, losing 10% or more of body weight may improve condition. Also advised to reduce saturated fats in diet to less than 10% of daily calories.  She does not benefit from statin therapy at present time

## 2022-10-03 NOTE — Assessment & Plan Note (Signed)
On CPAP with reported good compliance. Continue PAP therapy. Losing 15% or more of body weight may improve AHI.    

## 2022-10-03 NOTE — Assessment & Plan Note (Signed)
Reviewed candidacy for lung cancer screening she has a 10-year pack history and does not qualify.  She remains abstinent.

## 2022-10-05 ENCOUNTER — Other Ambulatory Visit (HOSPITAL_BASED_OUTPATIENT_CLINIC_OR_DEPARTMENT_OTHER): Payer: Self-pay

## 2022-10-05 ENCOUNTER — Ambulatory Visit (INDEPENDENT_AMBULATORY_CARE_PROVIDER_SITE_OTHER): Payer: Commercial Managed Care - PPO | Admitting: Physician Assistant

## 2022-10-05 ENCOUNTER — Encounter: Payer: Self-pay | Admitting: Physician Assistant

## 2022-10-05 DIAGNOSIS — F3341 Major depressive disorder, recurrent, in partial remission: Secondary | ICD-10-CM

## 2022-10-05 DIAGNOSIS — Z79899 Other long term (current) drug therapy: Secondary | ICD-10-CM

## 2022-10-05 DIAGNOSIS — G4700 Insomnia, unspecified: Secondary | ICD-10-CM | POA: Diagnosis not present

## 2022-10-05 DIAGNOSIS — R7989 Other specified abnormal findings of blood chemistry: Secondary | ICD-10-CM

## 2022-10-05 DIAGNOSIS — F411 Generalized anxiety disorder: Secondary | ICD-10-CM

## 2022-10-05 MED ORDER — LURASIDONE HCL 20 MG PO TABS
20.0000 mg | ORAL_TABLET | Freq: Every day | ORAL | 1 refills | Status: DC
  Filled 2022-10-05: qty 90, 90d supply, fill #0
  Filled 2022-10-31: qty 30, 30d supply, fill #0
  Filled 2022-11-30: qty 30, 30d supply, fill #1

## 2022-10-05 NOTE — Progress Notes (Unsigned)
[facility]  Patient ID: Aimee Hart,  MRN: [id number]  PCP: Sonny Masters, FNP  Date of Evaluation: 10/05/2022 Time spent:20 minutes  Chief Complaint:  Chief Complaint   Depression; Follow-up    HISTORY/CURRENT STATUS: HPI For routine med check.  Aimee Hart is making some changes in her life. Starting with personal trainer next week, going to weight loss and wellness clinic, going to a different church, is hiking some.   After the last visit, we started Jordan. Feels really good, "I like it."  Work is going well, adult kids are doing well, enjoys spending time with her SO.  Energy and motivation are good.  No extreme sadness, tearfulness, or feelings of hopelessness.  Sleeps well most of the time. ADLs and personal hygiene are normal.   Denies any changes in concentration, making decisions, or remembering things.  Denies suicidal or homicidal thoughts.  Anxiety isn't much of a problem. Xanax helps when needed. Not having PA, just gets overwhelmed about things sometimes and probably takes the Xanax a few times a wk at most.   Patient denies increased energy with decreased need for sleep, increased talkativeness, racing thoughts, impulsivity or risky behaviors, increased spending, increased libido, grandiosity, increased irritability or anger, paranoia, or hallucinations.  Denies dizziness, syncope, seizures, numbness, tingling, tremor, tics, unsteady gait, slurred speech, confusion. Denies muscle or joint pain, stiffness, or dystonia. Denies unexplained weight loss, frequent infections, or sores that heal slowly.  No polyphagia, polydipsia, or polyuria. Denies visual changes or paresthesias.   Individual Medical History/ Review of Systems: Changes? :No     Past medications for mental health diagnoses include: Zoloft, Prozac, Paxil, Abilify, Effexor, Celexa, Cymbalta, Lamictal, Ambien, melatonin, Wellbutrin SR, Trintellix, Viibryd, Cerefolin NAC (not sure if effective at  all plus was expensive) Auvelity  Allergies: Patient has no known allergies.  Current Medications:  Current Outpatient Medications:    ALPRAZolam (XANAX) 0.5 MG tablet, Take 1 tablet (0.5 mg total) by mouth 2 (two) times daily as needed for anxiety., Disp: 30 tablet, Rfl: 4   Dextromethorphan-buPROPion ER (AUVELITY) 45-105 MG TBCR, Take 1 tablet by mouth 2 (two) times daily., Disp: 180 tablet, Rfl: 0   estradiol (VIVELLE-DOT) 0.1 MG/24HR patch, Apply 1 patch twice a week by transdermal route., Disp: 8 patch, Rfl: 6   ibuprofen (ADVIL,MOTRIN) 200 MG tablet, Take 600 mg by mouth every 6 (six) hours as needed for headache, mild pain or moderate pain., Disp: , Rfl:    metroNIDAZOLE (METROCREAM) 0.75 % cream, Apply as directed to affected area twice a day; Use of SPF is recommended., Disp: 45 g, Rfl: 1   omeprazole (PRILOSEC) 10 MG capsule, Take 10 mg by mouth daily., Disp: , Rfl:    progesterone (PROMETRIUM) 100 MG capsule, Take 1 capsule every day by oral route at bedtime., Disp: 30 capsule, Rfl: 11   Vilazodone HCl (VIIBRYD) 40 MG TABS, Take 1 tablet by mouth daily., Disp: 90 tablet, Rfl: 3   Vitamin D, Cholecalciferol, 25 MCG (1000 UT) CAPS, Take by mouth., Disp: , Rfl:    zolpidem (AMBIEN) 10 MG tablet, Take 1/2 - 1 tablet by mouth at bedtime as needed for sleep., Disp: 30 tablet, Rfl: 4   lurasidone (LATUDA) 20 MG TABS tablet, Take 1 tablet (20 mg total) by mouth daily with supper., Disp: 90 tablet, Rfl: 1 Medication Side Effects: none  Family Medical/ Social History: Changes?  No   MENTAL HEALTH EXAM:  Last menstrual period 10/03/2022.There is no height or weight  on file to calculate BMI.  General Appearance: Casual, Neat and Well Groomed  Eye Contact:  Good  Speech:  Clear and Coherent and Normal Rate  Volume:  Normal  Mood:  Euthymic  Affect:  Congruent  Thought Process:  Goal Directed and Descriptions of Associations: Circumstantial  Orientation:  Full (Time, Place, and Person)   Thought Content: Logical   Suicidal Thoughts:  No  Homicidal Thoughts:  No  Memory:  WNL  Judgement:  Good  Insight:  Good  Psychomotor Activity:  Normal  Concentration:  Concentration: Good and Attention Span: Good  Recall:  Good  Fund of Knowledge: Good  Language: Good  Assets:  Communication Skills Desire for Improvement Financial Resources/Insurance Housing Resilience Transportation Vocational/Educational  ADL's:  Intact  Cognition: WNL  Prognosis:  Good    GeneSight results on chart, scanned under labs.   ECT-MADRS    Flowsheet Row Office Visit from 09/22/2021 in [facility] Office Visit from 08/22/2021 in [facility]  MADRS Total Score 4 36      GAD-7    Flowsheet Row Office Visit from 05/05/2021 in [facility] Office Visit from 12/29/2020 in [facility] Office Visit from 05/14/2018 in [facility]  Total GAD-7 Score 1 1 0      PHQ2-9    Flowsheet Row Office Visit from 05/05/2021 in [facility] Office Visit from 12/29/2020 in [facility] Office Visit from 12/25/2018 in [facility] Office Visit from 05/14/2018 in [facility] Office Visit from 05/08/2018 in [facility]  PHQ-2 Total Score 2 1 0 0 0  PHQ-9 Total Score 4 3 -- 0 --      Flowsheet Row ED from 05/30/2021 in [facility] Emergency Department at [facility]  C-SSRS RISK CATEGORY No Risk      DIAGNOSES:    ICD-10-CM   1. Recurrent major depression in partial remission (HCC)  F33.41     2. Low vitamin D level  R79.89 VITAMIN D 25 Hydroxy (Vit-D Deficiency, Fractures)    3. Encounter for long-term (current) use of medications  Z79.899 VITAMIN D 25 Hydroxy (Vit-D Deficiency,  Fractures)    4. Generalized anxiety disorder  F41.1     5. Insomnia, unspecified type  G47.00       Receiving Psychotherapy: Yes     RECOMMENDATIONS:  PDMP was reviewed.  Ambien filled 07/30/2022. Xanax filled 04/25/2022. I provided 20 minutes of face to face time during this encounter, including time spent before and after the visit in records review, medical decision making, counseling pertinent to today's visit, and charting.   I'm glad she's doing well. No change in tx except stop Vit D weekly and take only daily. Recheck Vitamin D in the next few weeks. Will send her lab results in mychart with further plan.  Continue poistive lifestyle changes.   Continue Xanax 0.5 mg, 1 p.o. twice daily as needed. Continue Auvelity 45/105 mg, 1 p.o. twice daily.   Continue Latuda 20 mg w/ supper. Continue Viibryd 40 mg p.o. daily.   Continue Ambien 10 mg, 1/2-1 nightly as needed sleep. Continue vitamins as per med list.  Lab ordered as above. Continue counseling. Return in 3 months.  Melony Overly, PA-C

## 2022-10-08 ENCOUNTER — Other Ambulatory Visit (HOSPITAL_BASED_OUTPATIENT_CLINIC_OR_DEPARTMENT_OTHER): Payer: Self-pay

## 2022-10-10 ENCOUNTER — Other Ambulatory Visit (HOSPITAL_BASED_OUTPATIENT_CLINIC_OR_DEPARTMENT_OTHER): Payer: Self-pay

## 2022-10-10 MED ORDER — PROGESTERONE MICRONIZED 100 MG PO CAPS
100.0000 mg | ORAL_CAPSULE | Freq: Every day | ORAL | 2 refills | Status: DC
  Filled 2022-10-10: qty 90, 90d supply, fill #0
  Filled 2023-01-10: qty 90, 90d supply, fill #1
  Filled 2023-04-17: qty 90, 90d supply, fill #2

## 2022-10-12 DIAGNOSIS — R7989 Other specified abnormal findings of blood chemistry: Secondary | ICD-10-CM | POA: Diagnosis not present

## 2022-10-12 DIAGNOSIS — Z79899 Other long term (current) drug therapy: Secondary | ICD-10-CM | POA: Diagnosis not present

## 2022-10-13 LAB — VITAMIN D 25 HYDROXY (VIT D DEFICIENCY, FRACTURES): Vit D, 25-Hydroxy: 34 ng/mL (ref 30.0–100.0)

## 2022-10-15 ENCOUNTER — Encounter: Payer: Self-pay | Admitting: Physician Assistant

## 2022-10-15 DIAGNOSIS — G4733 Obstructive sleep apnea (adult) (pediatric): Secondary | ICD-10-CM | POA: Diagnosis not present

## 2022-10-16 ENCOUNTER — Other Ambulatory Visit: Payer: Self-pay

## 2022-10-16 ENCOUNTER — Other Ambulatory Visit (HOSPITAL_BASED_OUTPATIENT_CLINIC_OR_DEPARTMENT_OTHER): Payer: Self-pay

## 2022-10-16 MED ORDER — VITAMIN D (ERGOCALCIFEROL) 1.25 MG (50000 UNIT) PO CAPS
50000.0000 [IU] | ORAL_CAPSULE | ORAL | 0 refills | Status: DC
  Filled 2022-10-16 – 2022-10-22 (×2): qty 12, 84d supply, fill #0

## 2022-10-18 DIAGNOSIS — Z85820 Personal history of malignant melanoma of skin: Secondary | ICD-10-CM | POA: Diagnosis not present

## 2022-10-18 DIAGNOSIS — D225 Melanocytic nevi of trunk: Secondary | ICD-10-CM | POA: Diagnosis not present

## 2022-10-18 DIAGNOSIS — L814 Other melanin hyperpigmentation: Secondary | ICD-10-CM | POA: Diagnosis not present

## 2022-10-18 DIAGNOSIS — L821 Other seborrheic keratosis: Secondary | ICD-10-CM | POA: Diagnosis not present

## 2022-10-18 DIAGNOSIS — L718 Other rosacea: Secondary | ICD-10-CM | POA: Diagnosis not present

## 2022-10-18 DIAGNOSIS — L918 Other hypertrophic disorders of the skin: Secondary | ICD-10-CM | POA: Diagnosis not present

## 2022-10-18 DIAGNOSIS — D1801 Hemangioma of skin and subcutaneous tissue: Secondary | ICD-10-CM | POA: Diagnosis not present

## 2022-10-22 ENCOUNTER — Other Ambulatory Visit (HOSPITAL_BASED_OUTPATIENT_CLINIC_OR_DEPARTMENT_OTHER): Payer: Self-pay

## 2022-10-22 ENCOUNTER — Other Ambulatory Visit: Payer: Self-pay

## 2022-10-29 ENCOUNTER — Other Ambulatory Visit (HOSPITAL_BASED_OUTPATIENT_CLINIC_OR_DEPARTMENT_OTHER): Payer: Self-pay

## 2022-10-29 MED ORDER — ESTRADIOL 0.1 MG/24HR TD PTTW
MEDICATED_PATCH | TRANSDERMAL | 1 refills | Status: DC
  Filled 2022-10-29: qty 24, 90d supply, fill #0
  Filled 2023-02-04 (×2): qty 24, 90d supply, fill #1

## 2022-10-30 ENCOUNTER — Ambulatory Visit (INDEPENDENT_AMBULATORY_CARE_PROVIDER_SITE_OTHER): Payer: Commercial Managed Care - PPO | Admitting: Internal Medicine

## 2022-10-30 ENCOUNTER — Encounter (INDEPENDENT_AMBULATORY_CARE_PROVIDER_SITE_OTHER): Payer: Self-pay | Admitting: Internal Medicine

## 2022-10-30 VITALS — BP 109/74 | HR 74 | Temp 98.1°F | Ht 65.0 in | Wt 207.0 lb

## 2022-10-30 DIAGNOSIS — E7800 Pure hypercholesterolemia, unspecified: Secondary | ICD-10-CM

## 2022-10-30 DIAGNOSIS — R7303 Prediabetes: Secondary | ICD-10-CM | POA: Diagnosis not present

## 2022-10-30 DIAGNOSIS — Z6834 Body mass index (BMI) 34.0-34.9, adult: Secondary | ICD-10-CM

## 2022-10-30 NOTE — Assessment & Plan Note (Signed)
Aimee Hart is doing well she has lost 13 pounds but is somewhat disappointed because of the distance between effort perceived and weight loss, she was reassured that she is losing weight at a healthy and consistent pace.  We provided her with multiple resources today including use of applications and assistance with how to cook in bulk and meal prepping.  She will start working on cooking and bulk on Sundays.  I also provided her with a list of prepackaged meal services and she may do a combination of these to get proper nutrition.  She will continue with nutritional and behavioral strategies.

## 2022-10-30 NOTE — Assessment & Plan Note (Signed)
LDL is not at goal. Elevated LDL may be secondary to nutrition, genetics and spillover effect from excess adiposity.. Her 10 year risk is: The 10-year ASCVD risk score (Arnett DK, et al., 2019) is: 2% so she does not benefit from statin therapy at present time.  Lab Results  Component Value Date   CHOL 195 07/27/2022   HDL 47 07/27/2022   LDLCALC 128 (H) 07/27/2022   TRIG 111 07/27/2022   CHOLHDL 4.1 07/27/2022    Continue weight loss therapy, losing 10% or more of body weight may improve condition. Also advised to reduce saturated fats in diet to less than 10% of daily calories.  She does not benefit from statin therapy at present time

## 2022-10-30 NOTE — Assessment & Plan Note (Signed)
She had a fasting blood glucose of 109 but her insulin levels and hemoglobin A1c are normal.  I recommend repeating fasting blood glucose in 6 months.  Losing 10% of body weight and increasing physical activity to 150 minutes a week will reduce the risk of progression.  We also discussed reducing simple and added sugars in her diet.

## 2022-10-30 NOTE — Progress Notes (Signed)
Office: [phone]  /  Fax: [phone]  WEIGHT SUMMARY AND BIOMETRICS  Vitals Temp: 98.1 F (36.7 C) BP: 109/74 Pulse Rate: 74 SpO2: 97 %   Anthropometric Measurements Height: 5\' 5"  (1.651 m) Weight: 207 lb (93.9 kg) BMI (Calculated): 34.45 Weight at Last Visit: 211 lb Weight Lost Since Last Visit: 4 lb Weight Gained Since Last Visit: 0 Starting Weight: 220 lb Total Weight Loss (lbs): 13 lb (5.897 kg) Peak Weight: 237 lb   Body Composition  Body Fat %: 44.1 % Fat Mass (lbs): 91.6 lbs Muscle Mass (lbs): 110.4 lbs Total Body Water (lbs): 82.2 lbs Visceral Fat Rating : 12    No data recorded Today's Visit #: 3  Starting Date: 09/19/22   HPI  Chief Complaint: OBESITY  Aimee Hart is here to discuss her progress with her obesity treatment plan. She is on the the Category 2 Plan and states she is following her eating plan approximately 70 % of the time.   Interval History:  Since last office visit she has lost 4 pounds. She reports fair adherence to reduced calorie nutritional plan. She has been working on not skipping meals, increasing protein intake at every meal, eating more fruits, drinking more water, avoiding and or reducing liquid calories, making healthier choices, and reducing portion sizes  Orixegenic Control: Denies problems with appetite and hunger signals.  Denies problems with satiety and satiation.  Denies problems with eating patterns and portion control.  Denies abnormal cravings. Denies feeling deprived or restricted.   Barriers identified: physical inactivity, cost of medication, and work schedule.   Pharmacotherapy for weight loss: She is currently taking no anti-obesity medication.    ASSESSMENT AND PLAN  TREATMENT PLAN FOR OBESITY:  Recommended Dietary Goals  Aimee Hart is currently in the action stage of change. As such, her goal is to continue weight management plan. She has agreed to: continue current plan  Behavioral  Intervention  We discussed the following Behavioral Modification Strategies today: increasing lean protein intake, decreasing simple carbohydrates , increasing vegetables, increasing lower glycemic fruits, increasing fiber rich foods, avoiding skipping meals, increasing water intake, work on meal planning and preparation, continue to practice mindfulness when eating, and planning for success.  Additional resources provided today:  We demonstrated the use of [employer]uka app to quickly assess quality of foods and also using Ai for assistance with menu items at popular restaurants  Recommended Physical Activity Goals  Aimee Hart has been advised to work up to 150 minutes of moderate intensity aerobic activity a week and strengthening exercises 2-3 times per week for cardiovascular health, weight loss maintenance and preservation of muscle mass.   She has agreed to :  Think about ways to increase daily physical activity and overcoming barriers to exercise and Increase physical activity in their day and reduce sedentary time (increase NEAT).  Pharmacotherapy We discussed various medication options to help Aimee Hart with her weight loss efforts and we both agreed to : continue with nutritional and behavioral strategies  ASSOCIATED CONDITIONS ADDRESSED TODAY  Pure hypercholesterolemia Assessment & Plan: LDL is not at goal. Elevated LDL may be secondary to nutrition, genetics and spillover effect from excess adiposity.. Her 10 year risk is: The 10-year ASCVD risk score (Arnett DK, et al., 2019) is: 2% so she does not benefit from statin therapy at present time.  Lab Results  Component Value Date   CHOL 195 07/27/2022   HDL 47 07/27/2022   LDLCALC 128 (H) 07/27/2022   TRIG 111 07/27/2022   CHOLHDL  4.1 07/27/2022    Continue weight loss therapy, losing 10% or more of body weight may improve condition. Also advised to reduce saturated fats in diet to less than 10% of daily calories.  She does not benefit from  statin therapy at present time         Prediabetes Assessment & Plan: She had a fasting blood glucose of 109 but her insulin levels and hemoglobin A1c are normal.  I recommend repeating fasting blood glucose in 6 months.  Losing 10% of body weight and increasing physical activity to 150 minutes a week will reduce the risk of progression.  We also discussed reducing simple and added sugars in her diet.   Class 2 severe obesity with serious comorbidity and body mass index (BMI) of 36.0 to 36.9 in adult, unspecified obesity type [facility]) Assessment & Plan: Aimee Hart is doing well she has lost 13 pounds but is somewhat disappointed because of the distance between effort perceived and weight loss, she was reassured that she is losing weight at a healthy and consistent pace.  We provided her with multiple resources today including use of applications and assistance with how to cook in bulk and meal prepping.  She will start working on cooking and bulk on Sundays.  I also provided her with a list of prepackaged meal services and she may do a combination of these to get proper nutrition.  She will continue with nutritional and behavioral strategies.     PHYSICAL EXAM:  Blood pressure 109/74, pulse 74, temperature 98.1 F (36.7 C), height 5\' 5"  (1.651 m), weight 207 lb (93.9 kg), last menstrual period 10/03/2022, SpO2 97 %. Body mass index is 34.45 kg/m.  General: She is overweight, cooperative, alert, well developed, and in no acute distress. PSYCH: Has normal mood, affect and thought process.   HEENT: EOMI, sclerae are anicteric. Lungs: Normal breathing effort, no conversational dyspnea. Extremities: No edema.  Neurologic: No gross sensory or motor deficits. No tremors or fasciculations noted.    DIAGNOSTIC DATA REVIEWED:  BMET    Component Value Date/Time   NA 137 07/27/2022 1019   K 4.6 07/27/2022 1019   CL 105 07/27/2022 1019   CO2 21 07/27/2022 1019   GLUCOSE 110 (H) 07/27/2022  1019   GLUCOSE 95 05/30/2021 1400   BUN 17 07/27/2022 1019   CREATININE 1.04 (H) 07/27/2022 1019   CALCIUM 8.7 07/27/2022 1019   GFRNONAA >60 05/30/2021 1400   GFRAA 87 12/25/2018 1224   Lab Results  Component Value Date   HGBA1C 5.6 07/27/2022   Lab Results  Component Value Date   INSULIN 7.3 09/19/2022   Lab Results  Component Value Date   TSH 1.510 07/27/2022   CBC    Component Value Date/Time   WBC 4.5 07/27/2022 1019   WBC 5.2 05/30/2021 1400   RBC 4.78 07/27/2022 1019   RBC 4.95 05/30/2021 1400   HGB 12.9 07/27/2022 1019   HCT 38.7 07/27/2022 1019   PLT 277 07/27/2022 1019   MCV 81 07/27/2022 1019   MCH 27.0 07/27/2022 1019   MCH 26.5 05/30/2021 1400   MCHC 33.3 07/27/2022 1019   MCHC 32.5 05/30/2021 1400   RDW 14.3 07/27/2022 1019   Iron Studies No results found for: "IRON", "TIBC", "FERRITIN", "IRONPCTSAT" Lipid Panel     Component Value Date/Time   CHOL 195 07/27/2022 1019   TRIG 111 07/27/2022 1019   HDL 47 07/27/2022 1019   CHOLHDL 4.1 07/27/2022 1019   LDLCALC 128 (H)  07/27/2022 1019   Hepatic Function Panel     Component Value Date/Time   PROT 6.2 07/27/2022 1019   ALBUMIN 4.0 07/27/2022 1019   AST 13 07/27/2022 1019   ALT 9 07/27/2022 1019   ALKPHOS 65 07/27/2022 1019   BILITOT 0.3 07/27/2022 1019   BILIDIR <0.1 05/30/2021 1528   IBILI NOT CALCULATED 05/30/2021 1528      Component Value Date/Time   TSH 1.510 07/27/2022 1019   Nutritional Lab Results  Component Value Date   VD25OH 34.0 10/12/2022   VD25OH 31.7 09/10/2022   VD25OH 20.6 (L) 07/27/2022     Return in about 3 weeks (around 11/20/2022) for For Weight Mangement with Dr. Rikki Spearing.Marland Kitchen She was informed of the importance of frequent follow up visits to maximize her success with intensive lifestyle modifications for her multiple health conditions.   ATTESTASTION STATEMENTS:  Reviewed by clinician on day of visit: allergies, medications, problem list, medical history,  surgical history, family history, social history, and previous encounter notes.     Worthy Rancher, MD

## 2022-10-30 NOTE — Progress Notes (Deleted)
Office: [phone]  /  Fax: [phone]  WEIGHT SUMMARY AND BIOMETRICS  No data recorded No data recorded No data recorded  No data recorded No data recorded No data recorded  HPI  Chief Complaint: OBESITY  Aimee Hart is here to discuss her progress with her obesity treatment plan. She is on the the Category 2 Plan and states she is following her eating plan approximately 90 % of the time. She states she is walking one day per week for 15 minutes.  Interval History:  Since last office visit she has []  lost []  maintained [] gained weight.  Adherence to nutrition plan :  []  Excellent []  Good []  Fair []  Suboptimal []  Variable []  Gradual implementation [] Has not started implementation  Nutritional: She has been working on [employer] Control: [] Denies [] Reports problems with appetite and hunger signals.  [] Denies [] Reports problems with satiety and satiation.  [] Denies [] Reports problems with eating patterns and portion control.  [] Denies [] Reports strong cravings for highly palatable foods [] Denies [] Reports problems with feeling restricted or deprived  Stress levels: []  Low [] Medium [] High   Barriers identified: {EMOBESITYBARRIERS:28841::"none"}.   Pharmacotherapy for weight loss: She is currently taking {EMPharmaco:28845::"no anti-obesity medication"}.    ASSESSMENT AND PLAN  TREATMENT PLAN FOR OBESITY:  Recommended Dietary Goals  Aimee Hart is currently in the action stage of change. As such, her goal is to continue weight management plan. She has agreed to: {EMWTLOSSPLAN:29297::"continue current plan"}  Behavioral Intervention  We discussed the following Behavioral Modification Strategies today: {EMWMwtlossstrategies:28914::"increasing lean protein intake","decreasing simple carbohydrates ","increasing vegetables","increasing lower glycemic fruits","increasing water intake","continue to practice mindfulness when eating","planning for  success"}.  Additional resources provided today: {EMadditionalresources:29169::"None"}  Recommended Physical Activity Goals  Aimee Hart has been advised to work up to 150 minutes of moderate intensity aerobic activity a week and strengthening exercises 2-3 times per week for cardiovascular health, weight loss maintenance and preservation of muscle mass.   She has agreed to :  {EMEXERCISE:28847::"Think about ways to increase daily physical activity and overcoming barriers to exercise"}  Pharmacotherapy We have discussed various medication options to help Aimee Hart with her weight loss efforts and we both agreed to : {EMagreedrx:29170::"continue with nutritional and behavioral strategies"}  ASSOCIATED CONDITIONS ADDRESSED TODAY  There are no diagnoses linked to this encounter.  PHYSICAL EXAM:  Last menstrual period 10/03/2022. There is no height or weight on file to calculate BMI.  General: She is overweight, cooperative, alert, well developed, and in no acute distress. PSYCH: Has normal mood, affect and thought process.   HEENT: EOMI, sclerae are anicteric. Lungs: Normal breathing effort, no conversational dyspnea. Extremities: No edema.  Neurologic: No gross sensory or motor deficits. No tremors or fasciculations noted.    DIAGNOSTIC DATA REVIEWED:  BMET    Component Value Date/Time   NA 137 07/27/2022 1019   K 4.6 07/27/2022 1019   CL 105 07/27/2022 1019   CO2 21 07/27/2022 1019   GLUCOSE 110 (H) 07/27/2022 1019   GLUCOSE 95 05/30/2021 1400   BUN 17 07/27/2022 1019   CREATININE 1.04 (H) 07/27/2022 1019   CALCIUM 8.7 07/27/2022 1019   GFRNONAA >60 05/30/2021 1400   GFRAA 87 12/25/2018 1224   EGFR 63 07/27/2022 1019   Lab Results  Component Value Date   HGBA1C 5.6 07/27/2022   Lab Results  Component Value Date   INSULIN 7.3 09/19/2022   Lab Results  Component Value Date   TSH 1.510 07/27/2022   CBC    Component Value Date/Time   WBC  4.5 07/27/2022 1019   WBC  5.2 05/30/2021 1400   RBC 4.78 07/27/2022 1019   RBC 4.95 05/30/2021 1400   HGB 12.9 07/27/2022 1019   HCT 38.7 07/27/2022 1019   PLT 277 07/27/2022 1019   MCV 81 07/27/2022 1019   MCH 27.0 07/27/2022 1019   MCH 26.5 05/30/2021 1400   MCHC 33.3 07/27/2022 1019   MCHC 32.5 05/30/2021 1400   RDW 14.3 07/27/2022 1019   Iron Studies No results found for: "IRON", "TIBC", "FERRITIN", "IRONPCTSAT" Lipid Panel     Component Value Date/Time   CHOL 195 07/27/2022 1019   TRIG 111 07/27/2022 1019   HDL 47 07/27/2022 1019   CHOLHDL 4.1 07/27/2022 1019   LDLCALC 128 (H) 07/27/2022 1019   Hepatic Function Panel     Component Value Date/Time   PROT 6.2 07/27/2022 1019   ALBUMIN 4.0 07/27/2022 1019   AST 13 07/27/2022 1019   ALT 9 07/27/2022 1019   ALKPHOS 65 07/27/2022 1019   BILITOT 0.3 07/27/2022 1019   BILIDIR <0.1 05/30/2021 1528   IBILI NOT CALCULATED 05/30/2021 1528      Component Value Date/Time   TSH 1.510 07/27/2022 1019   Nutritional Lab Results  Component Value Date   VD25OH 34.0 10/12/2022   VD25OH 31.7 09/10/2022   VD25OH 20.6 (L) 07/27/2022     No follow-ups on file.Marland Kitchen She was informed of the importance of frequent follow up visits to maximize her success with intensive lifestyle modifications for her multiple health conditions.   ATTESTASTION STATEMENTS:  Reviewed by clinician on day of visit: allergies, medications, problem list, medical history, surgical history, family history, social history, and previous encounter notes.     Worthy Rancher, MD

## 2022-10-31 ENCOUNTER — Other Ambulatory Visit (HOSPITAL_BASED_OUTPATIENT_CLINIC_OR_DEPARTMENT_OTHER): Payer: Self-pay

## 2022-11-22 ENCOUNTER — Ambulatory Visit (INDEPENDENT_AMBULATORY_CARE_PROVIDER_SITE_OTHER): Payer: Commercial Managed Care - PPO | Admitting: Internal Medicine

## 2022-11-28 ENCOUNTER — Other Ambulatory Visit: Payer: Self-pay

## 2022-12-12 IMAGING — DX DG CHEST 2V
2 series · 2 of 2 positions shown · non-contrast
Comparison: Chest x-ray from January 14, 2018.

CLINICAL DATA: Near syncope in a 55-year-old female.

EXAM:
CHEST - 2 VIEW

[chest pa]
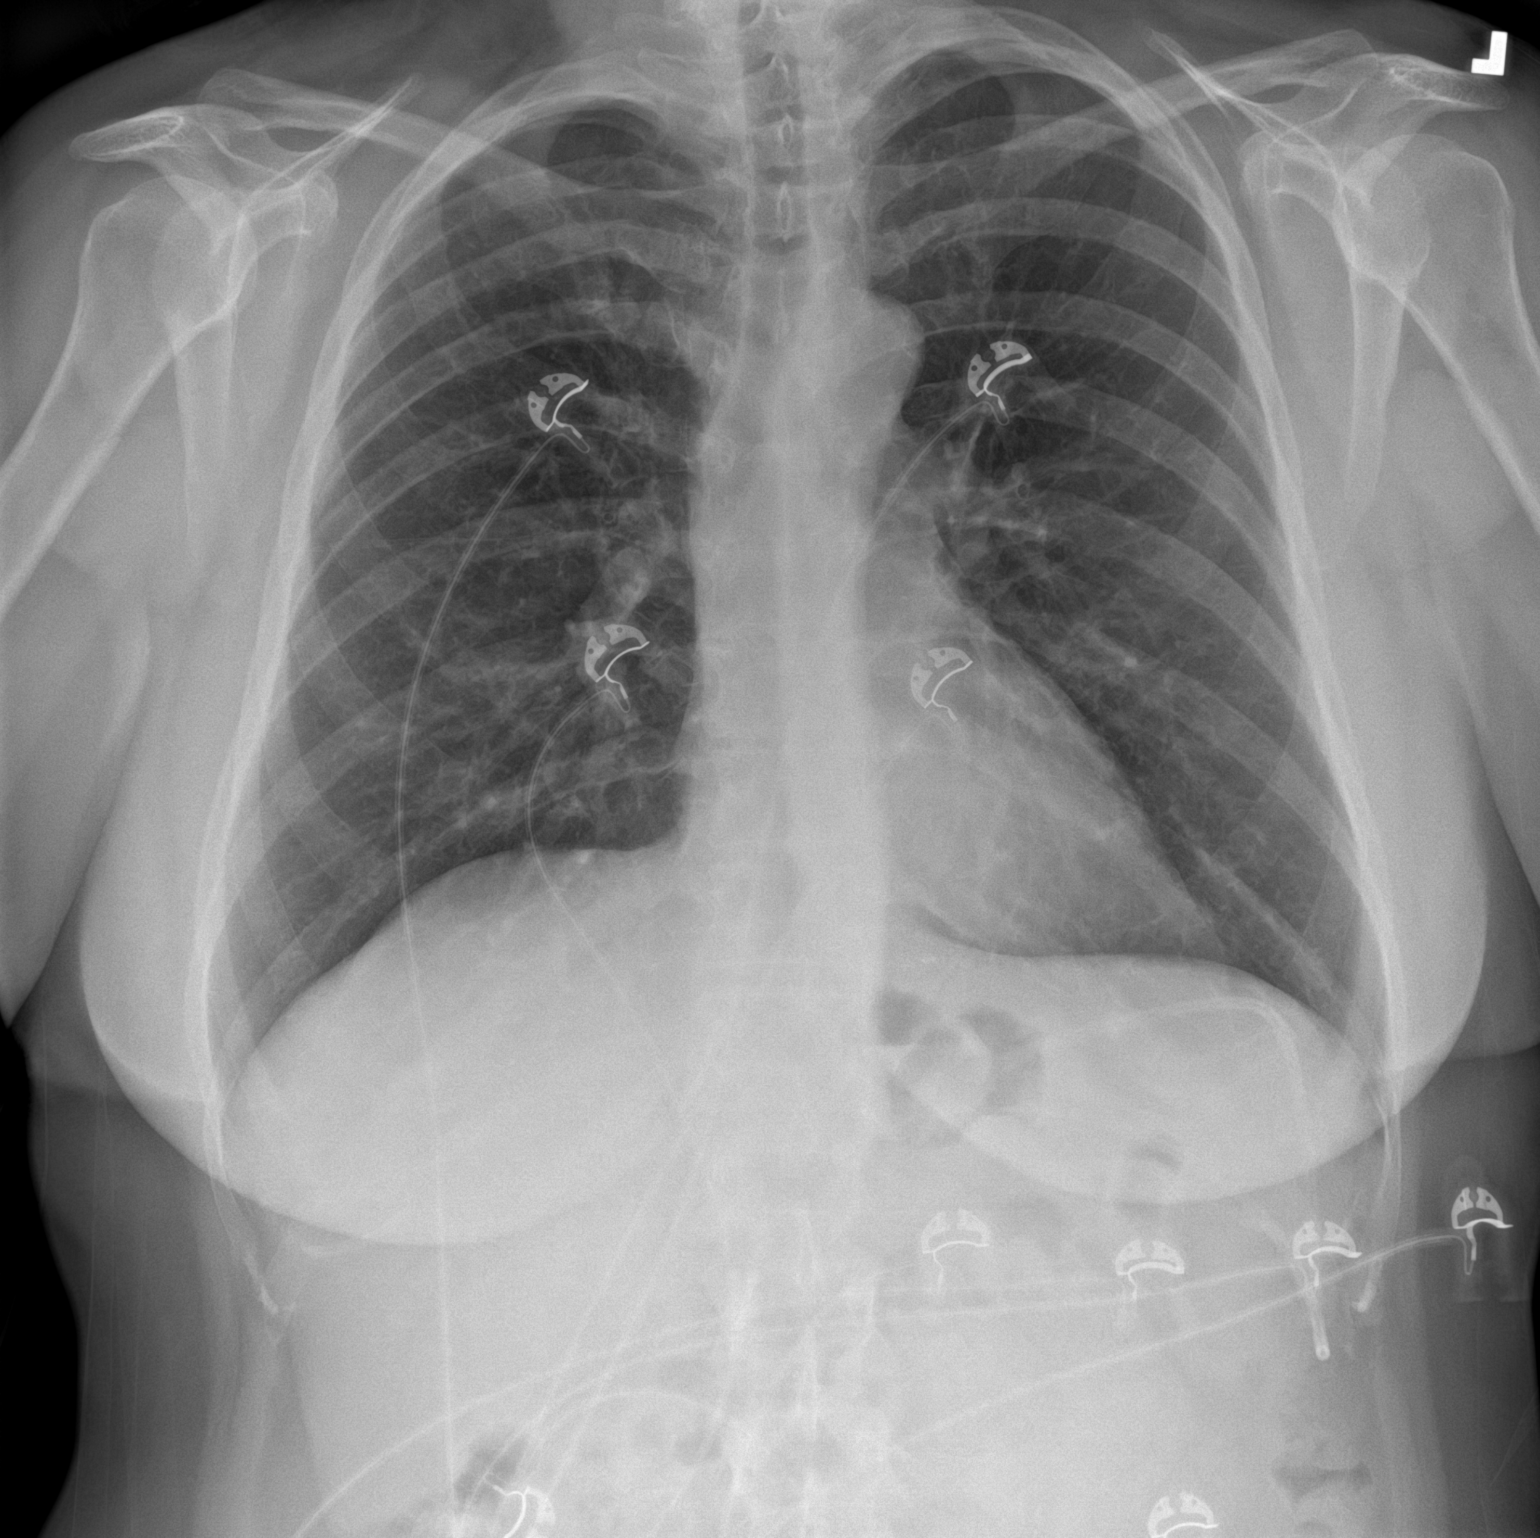

[chest lat]
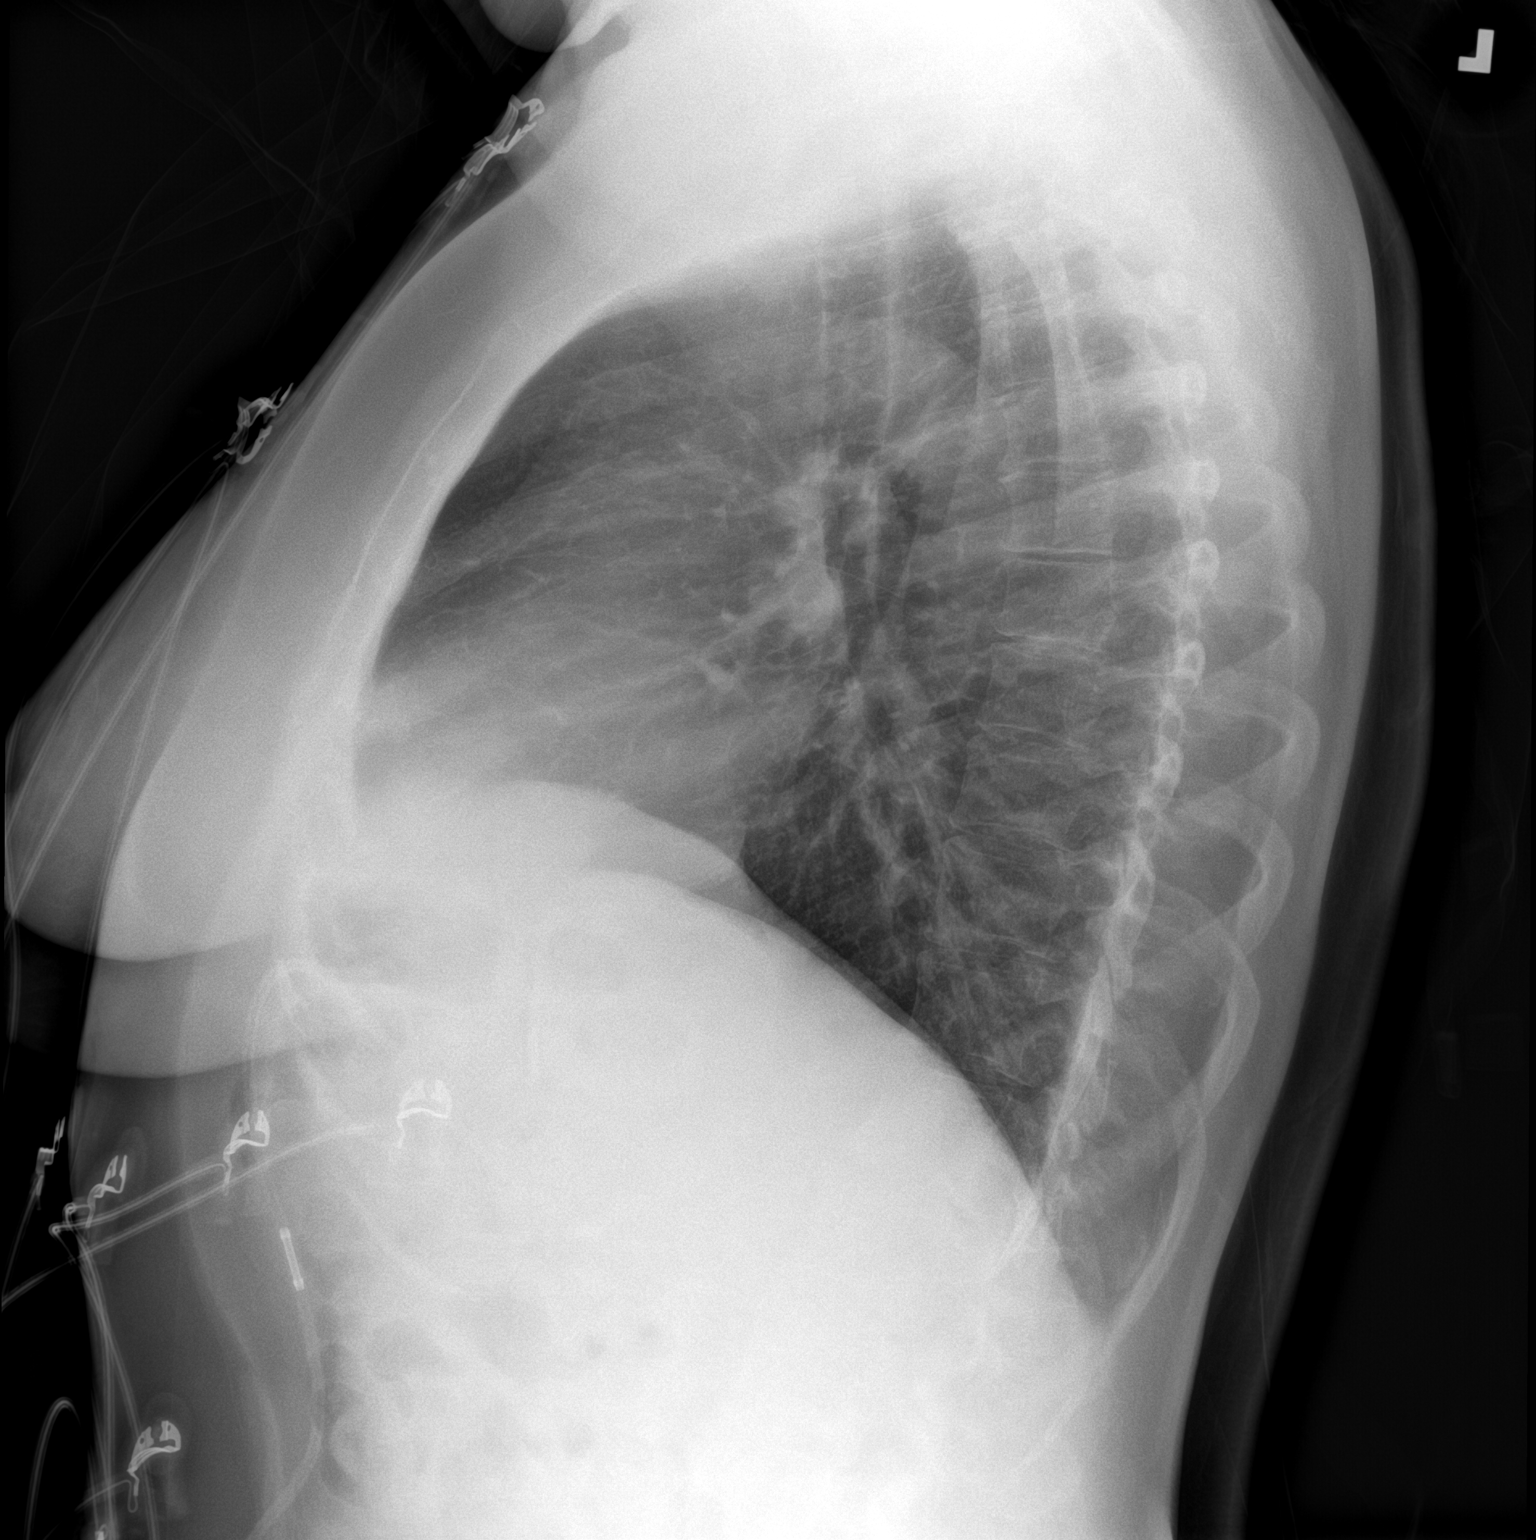

[2 of 2 positions shown; findings below may reference images not displayed]

FINDINGS: Trachea is midline.

Cardiomediastinal contours and hilar structures are stable. Lungs
are clear aside from linear opacity at the LEFT lung base likely
scarring or atelectasis. No visible pneumothorax. EKG leads project
over the chest an a gastric band projects over the upper abdomen. No
sign of pleural effusion.

On limited assessment there is no acute skeletal finding.
IMPRESSION: No active cardiopulmonary disease.

## 2022-12-19 ENCOUNTER — Other Ambulatory Visit (HOSPITAL_BASED_OUTPATIENT_CLINIC_OR_DEPARTMENT_OTHER): Payer: Self-pay

## 2022-12-19 ENCOUNTER — Other Ambulatory Visit: Payer: Self-pay | Admitting: Physician Assistant

## 2022-12-19 MED ORDER — AUVELITY 45-105 MG PO TBCR
1.0000 | EXTENDED_RELEASE_TABLET | Freq: Two times a day (BID) | ORAL | 0 refills | Status: DC
  Filled 2022-12-19: qty 180, 90d supply, fill #0

## 2022-12-20 ENCOUNTER — Other Ambulatory Visit: Payer: Self-pay

## 2022-12-20 ENCOUNTER — Other Ambulatory Visit (HOSPITAL_BASED_OUTPATIENT_CLINIC_OR_DEPARTMENT_OTHER): Payer: Self-pay

## 2022-12-20 MED ORDER — ALPRAZOLAM 0.5 MG PO TABS
0.5000 mg | ORAL_TABLET | Freq: Two times a day (BID) | ORAL | 5 refills | Status: DC | PRN
  Filled 2022-12-20: qty 30, 15d supply, fill #0
  Filled 2023-04-25: qty 30, 15d supply, fill #1

## 2023-01-03 DIAGNOSIS — G4733 Obstructive sleep apnea (adult) (pediatric): Secondary | ICD-10-CM | POA: Diagnosis not present

## 2023-01-07 ENCOUNTER — Encounter: Payer: Self-pay | Admitting: Physician Assistant

## 2023-01-07 ENCOUNTER — Other Ambulatory Visit (HOSPITAL_BASED_OUTPATIENT_CLINIC_OR_DEPARTMENT_OTHER): Payer: Self-pay

## 2023-01-07 ENCOUNTER — Ambulatory Visit (INDEPENDENT_AMBULATORY_CARE_PROVIDER_SITE_OTHER): Payer: Commercial Managed Care - PPO | Admitting: Physician Assistant

## 2023-01-07 DIAGNOSIS — F3341 Major depressive disorder, recurrent, in partial remission: Secondary | ICD-10-CM

## 2023-01-07 DIAGNOSIS — R7989 Other specified abnormal findings of blood chemistry: Secondary | ICD-10-CM

## 2023-01-07 DIAGNOSIS — F411 Generalized anxiety disorder: Secondary | ICD-10-CM | POA: Diagnosis not present

## 2023-01-07 MED ORDER — LURASIDONE HCL 40 MG PO TABS
40.0000 mg | ORAL_TABLET | Freq: Every day | ORAL | 1 refills | Status: DC
  Filled 2023-01-07: qty 30, 30d supply, fill #0
  Filled 2023-02-13: qty 30, 30d supply, fill #1

## 2023-01-07 NOTE — Progress Notes (Unsigned)
[facility]  Patient ID: Aimee Hart,  MRN: [id number]  PCP: Sonny Masters, FNP  Date of Evaluation: 01/07/2023 Time spent:20 minutes  Chief Complaint:  Chief Complaint   Anxiety; Depression; Follow-up    HISTORY/CURRENT STATUS: HPI For routine med check.      Individual Medical History/ Review of Systems: Changes? :No     Past medications for mental health diagnoses include: Zoloft, Prozac, Paxil, Abilify, Effexor, Celexa, Cymbalta, Lamictal, Ambien, melatonin, Wellbutrin SR, Trintellix, Viibryd, Cerefolin NAC (not sure if effective at all plus was expensive) Auvelity  Allergies: Patient has no known allergies.  Current Medications:  Current Outpatient Medications:    ALPRAZolam (XANAX) 0.5 MG tablet, Take 1 tablet (0.5 mg total) by mouth 2 (two) times daily as needed for anxiety., Disp: 30 tablet, Rfl: 5   Dextromethorphan-buPROPion ER (AUVELITY) 45-105 MG TBCR, Take 1 tablet by mouth 2 (two) times daily., Disp: 180 tablet, Rfl: 0   estradiol (VIVELLE-DOT) 0.1 MG/24HR patch, Apply 1 patch to the skin twice a week., Disp: 24 patch, Rfl: 1   ibuprofen (ADVIL,MOTRIN) 200 MG tablet, Take 600 mg by mouth every 6 (six) hours as needed for headache, mild pain or moderate pain., Disp: , Rfl:    lurasidone (LATUDA) 40 MG TABS tablet, Take 1 tablet (40 mg total) by mouth daily with supper., Disp: 30 tablet, Rfl: 1   metroNIDAZOLE (METROCREAM) 0.75 % cream, Apply as directed to affected area twice a day; Use of SPF is recommended., Disp: 45 g, Rfl: 1   omeprazole (PRILOSEC) 10 MG capsule, Take 10 mg by mouth daily., Disp: , Rfl:    progesterone (PROMETRIUM) 100 MG capsule, Take 1 capsule every day by oral route at bedtime., Disp: 30 capsule, Rfl: 11   progesterone (PROMETRIUM) 100 MG capsule, Take 1 capsule (100 mg total) by mouth at bedtime., Disp: 90 capsule, Rfl: 2   Vilazodone HCl (VIIBRYD) 40 MG TABS, Take 1 tablet by mouth daily., Disp: 90 tablet, Rfl: 3    Vitamin D, Cholecalciferol, 25 MCG (1000 UT) CAPS, Take by mouth., Disp: , Rfl:    Vitamin D, Ergocalciferol, (DRISDOL) 1.25 MG (50000 UNIT) CAPS capsule, Take 1 capsule (50,000 Units total) by mouth every 7 (seven) days., Disp: 12 capsule, Rfl: 0   zolpidem (AMBIEN) 10 MG tablet, Take 1/2 - 1 tablet by mouth at bedtime as needed for sleep., Disp: 30 tablet, Rfl: 4 Medication Side Effects: none  Family Medical/ Social History: Changes?  No   MENTAL HEALTH EXAM:  There were no vitals taken for this visit.There is no height or weight on file to calculate BMI.  General Appearance: Casual, Neat and Well Groomed  Eye Contact:  Good  Speech:  Clear and Coherent and Normal Rate  Volume:  Normal  Mood:  Euthymic  Affect:  Congruent  Thought Process:  Goal Directed and Descriptions of Associations: Circumstantial  Orientation:  Full (Time, Place, and Person)  Thought Content: Logical   Suicidal Thoughts:  No  Homicidal Thoughts:  No  Memory:  WNL  Judgement:  Good  Insight:  Good  Psychomotor Activity:  Normal  Concentration:  Concentration: Good and Attention Span: Good  Recall:  Good  Fund of Knowledge: Good  Language: Good  Assets:  Communication Skills Desire for Improvement Financial Resources/Insurance Housing Resilience Transportation Vocational/Educational  ADL's:  Intact  Cognition: WNL  Prognosis:  Good    GeneSight results on chart, scanned under labs.   ECT-MADRS    [employer]  Visit from 09/22/2021 in [facility] Office Visit from 08/22/2021 in [facility]  MADRS Total Score 4 36      GAD-7    Flowsheet Row Office Visit from 05/05/2021 in [facility] Office Visit from 12/29/2020 in [facility] Office Visit from 05/14/2018 in [facility]  Total GAD-7 Score 1 1 0      PHQ2-9    Flowsheet Row  Office Visit from 05/05/2021 in [facility] Office Visit from 12/29/2020 in [facility] Office Visit from 12/25/2018 in [facility] Office Visit from 05/14/2018 in [facility] Office Visit from 05/08/2018 in [facility]  PHQ-2 Total Score 2 1 0 0 0  PHQ-9 Total Score 4 3 -- 0 --      Flowsheet Row ED from 05/30/2021 in [facility] Emergency Department at [facility]  C-SSRS RISK CATEGORY No Risk      DIAGNOSES:    ICD-10-CM   1. Recurrent major depression in partial remission (HCC)  F33.41     2. Generalized anxiety disorder  F41.1     3. Low vitamin D level  R79.89       Receiving Psychotherapy: Yes     RECOMMENDATIONS:  PDMP was reviewed.  Ambien filled 07/30/2022.  Xanax filled 12/20/2022.     Continue Xanax 0.5 mg, 1 p.o. twice daily as needed. Continue Auvelity 45/105 mg, 1 p.o. twice daily.   Continue Latuda 20 mg w/ supper. Continue Viibryd 40 mg p.o. daily.   Continue Ambien 10 mg, 1/2-1 nightly as needed sleep. Continue vitamins as per med list.  Continue counseling. Return in 3 months.  Melony Overly, PA-C

## 2023-01-15 ENCOUNTER — Other Ambulatory Visit (HOSPITAL_BASED_OUTPATIENT_CLINIC_OR_DEPARTMENT_OTHER): Payer: Self-pay

## 2023-01-15 DIAGNOSIS — L817 Pigmented purpuric dermatosis: Secondary | ICD-10-CM | POA: Diagnosis not present

## 2023-01-15 MED ORDER — TRIAMCINOLONE ACETONIDE 0.1 % EX CREA
1.0000 | TOPICAL_CREAM | Freq: Every evening | CUTANEOUS | 1 refills | Status: DC
  Filled 2023-01-15: qty 80, 60d supply, fill #0

## 2023-01-29 ENCOUNTER — Other Ambulatory Visit (HOSPITAL_BASED_OUTPATIENT_CLINIC_OR_DEPARTMENT_OTHER): Payer: Self-pay

## 2023-01-31 ENCOUNTER — Encounter (HOSPITAL_COMMUNITY): Payer: Self-pay

## 2023-01-31 ENCOUNTER — Emergency Department (HOSPITAL_COMMUNITY)
Admission: EM | Admit: 2023-01-31 | Discharge: 2023-01-31 | Disposition: A | Discharge status: Home / Self Care | Payer: Commercial Managed Care - PPO | Attending: Emergency Medicine | Admitting: Emergency Medicine

## 2023-01-31 ENCOUNTER — Other Ambulatory Visit: Payer: Self-pay

## 2023-01-31 DIAGNOSIS — R45851 Suicidal ideations: Secondary | ICD-10-CM | POA: Diagnosis not present

## 2023-01-31 DIAGNOSIS — Z79899 Other long term (current) drug therapy: Secondary | ICD-10-CM | POA: Diagnosis not present

## 2023-01-31 DIAGNOSIS — Z87891 Personal history of nicotine dependence: Secondary | ICD-10-CM | POA: Insufficient documentation

## 2023-01-31 DIAGNOSIS — F411 Generalized anxiety disorder: Secondary | ICD-10-CM | POA: Diagnosis present

## 2023-01-31 DIAGNOSIS — F329 Major depressive disorder, single episode, unspecified: Secondary | ICD-10-CM | POA: Insufficient documentation

## 2023-01-31 DIAGNOSIS — R4589 Other symptoms and signs involving emotional state: Secondary | ICD-10-CM | POA: Diagnosis not present

## 2023-01-31 DIAGNOSIS — R9431 Abnormal electrocardiogram [ECG] [EKG]: Secondary | ICD-10-CM | POA: Diagnosis not present

## 2023-01-31 LAB — COMPREHENSIVE METABOLIC PANEL
ALT: 14 U/L (ref 0–44)
AST: 19 U/L (ref 15–41)
Albumin: 4.1 g/dL (ref 3.5–5.0)
Alkaline Phosphatase: 56 U/L (ref 38–126)
Anion gap: 6 (ref 5–15)
BUN: 15 mg/dL (ref 6–20)
CO2: 25 mmol/L (ref 22–32)
Calcium: 8.6 mg/dL — ABNORMAL LOW (ref 8.9–10.3)
Chloride: 106 mmol/L (ref 98–111)
Creatinine, Ser: 1.02 mg/dL — ABNORMAL HIGH (ref 0.44–1.00)
GFR, Estimated: >60 mL/min (ref >60)
Glucose, Bld: 92 mg/dL (ref 70–99)
Potassium: 3.8 mmol/L (ref 3.5–5.1)
Sodium: 137 mmol/L (ref 135–145)
Total Bilirubin: 0.5 mg/dL (ref 0.3–1.2)
Total Protein: 7 g/dL (ref 6.5–8.1)

## 2023-01-31 LAB — RAPID URINE DRUG SCREEN, HOSP PERFORMED
Amphetamines: NOT DETECTED
Barbiturates: NOT DETECTED
Benzodiazepines: NOT DETECTED
Cocaine: NOT DETECTED
Opiates: NOT DETECTED
Tetrahydrocannabinol: NOT DETECTED

## 2023-01-31 LAB — SALICYLATE LEVEL: Salicylate Lvl: <7 mg/dL — ABNORMAL LOW (ref 7.0–30.0)

## 2023-01-31 LAB — ETHANOL: Alcohol, Ethyl (B): <10 mg/dL (ref <10)

## 2023-01-31 LAB — CBC
HCT: 41.6 % (ref 36.0–46.0)
Hemoglobin: 13.3 g/dL (ref 12.0–15.0)
MCH: 27 pg (ref 26.0–34.0)
MCHC: 32 g/dL (ref 30.0–36.0)
MCV: 84.6 fL (ref 80.0–100.0)
Platelets: 257 10*3/uL (ref 150–400)
RBC: 4.92 MIL/uL (ref 3.87–5.11)
RDW: 14.1 % (ref 11.5–15.5)
WBC: 5.2 10*3/uL (ref 4.0–10.5)
nRBC: 0 % (ref 0.0–0.2)

## 2023-01-31 LAB — ACETAMINOPHEN LEVEL: Acetaminophen (Tylenol), Serum: <10 ug/mL — ABNORMAL LOW (ref 10–30)

## 2023-01-31 LAB — HCG, SERUM, QUALITATIVE: Preg, Serum: NEGATIVE

## 2023-01-31 NOTE — Discharge Instructions (Addendum)
You were seen in the ER for evaluation of your suicidal ideations and depression/anxiety. Psych has cleared you for discharge home. Please make sure you follow up with their recommendations that they discussed with you and some are listed below. If you have any concerns, new or worsening symptoms, please return to the nearest ER for re-evaluation.   Discharge recommendations:  Patient is to take medications as prescribed. Please see information for follow-up appointment with psychiatry and therapy. Please follow up with your primary care provider for all medical related needs.   Therapy: We recommend that patient participate in individual therapy to address mental health concerns.  Medications: The patient or guardian is to contact a medical professional and/or outpatient provider to address any new side effects that develop. The patient or guardian should update outpatient providers of any new medications and/or medication changes.   Atypical antipsychotics: If you are prescribed an atypical antipsychotic, it is recommended that your height, weight, BMI, blood pressure, fasting lipid panel, and fasting blood sugar be monitored by your outpatient providers.  Safety:  The patient should abstain from use of illicit substances/drugs and abuse of any medications. If symptoms worsen or do not continue to improve or if the patient becomes actively suicidal or homicidal then it is recommended that the patient return to the closest hospital emergency department, the [facility], or call 911 for further evaluation and treatment. National Suicide Prevention Lifeline 1-800-SUICIDE or (825)266-9503.  About 988 988 offers 24/7 access to trained crisis counselors who can help people experiencing mental health-related distress. People can call or text 988 or chat 988lifeline.org for themselves or if they are worried about a loved one who may need crisis support.  Crisis Mobile:  [facility]:                     484-867-0810 (for crisis response 24 hours a day) [facility] Hotline:                                            815-388-8853   Safety Plan Aimee Hart will reach out to her daughter Irving Burton, call 911 or call mobile crisis, or go to nearest emergency room if condition worsens or if suicidal thoughts become active Patients' will follow up with [facility] for outpatient psychiatric services (therapy/medication management).  The suicide prevention education provided includes the following: Suicide risk factors Suicide prevention and interventions National Suicide Hotline telephone number [facility] assessment telephone number [facility] Emergency Assistance 911 [facility] and/or Residential Mobile Crisis Unit telephone number Request made of family/significant other to:  daughter Saralyn Pilar weapons (e.g., guns, rifles, knives), all items previously/currently identified as safety concern.   Remove drugs/medications (over the counter, prescriptions, illicit drugs), all items previously/currently identified as a safety concern.

## 2023-01-31 NOTE — ED Provider Triage Note (Signed)
Emergency Medicine Provider Triage Evaluation Note  Aimee Hart , a 57 y.o. female  was evaluated in triage.  Pt complains of suicidal ideations. She reports that she has thoughts of "not wanting to exist anymore". She denies any plan. Nursing note mentions that she had thoughts or hurting herself, however the patient denies this. She feels like this is related to her menopause. She reports she has been feeling like this since August, but worsening over the past two weeks. No new stressors or events happened during that time. Denies any fevers, chest pain, SOB, or dysuria. Denies any hallucinations. Denies any homicidal ideations. Former smoker. 1 glass per week of EtOH. Denies any illicit drug use.   Review of Systems  Positive:  Negative:   Physical Exam  BP (!) 133/90 (BP Location: Left Arm)   Pulse 76   Temp 98.6 F (37 C) (Oral)   Resp 16   Ht 5\' 5"  (1.651 m)   Wt 99.8 kg   SpO2 97%   BMI 36.61 kg/m  Gen:   Awake, no distress   Resp:  Normal effort  MSK:   Moves extremities without difficulty  Other:    Medical Decision Making  Medically screening exam initiated at 1:04 PM.  Appropriate orders placed.  Aimee Hart was informed that the remainder of the evaluation will be completed by another provider, this initial triage assessment does not replace that evaluation, and the importance of remaining in the ED until their evaluation is complete.  Labs ordered by nursing staff. Adding EKG for QT prolongation check.   Patient is frustrated with ER wait times. I apologized about the wait and discussed acuity with the patient.    Achille Rich, PA-C 01/31/23 1601

## 2023-01-31 NOTE — ED Provider Notes (Signed)
[place] EMERGENCY DEPARTMENT AT [facility] Provider Note   CSN: [id number] Arrival date & time: 01/31/23  4098     History Chief Complaint  Patient presents with   Suicidal    Aimee Hart is a 57 y.o. female with h/o MDD and GAD, seen by psychiatry outpatient, presents to the ER for evaluation of suicidal ideations. She reports that she has thoughts of "not wanting to exist anymore". She denies any plan. Nursing note mentions that she had thoughts or hurting herself, however the patient denies this. She feels like this is related to her menopause. She reports she has been feeling like this since August, but worsening over the past two weeks. She has already seen her psychiatrist about this and has had her medications recently increased which she doesn't feel it helping. No new stressors or events happened during that time. Denies any fevers, chest pain, SOB, or dysuria. Denies any hallucinations. Denies any homicidal ideations. She reports she was at work today and felt that she couldn't control her emotions and reports feeling overwhelmed. Former smoker. One glass per week of EtOH. Denies any illicit drug use.   HPI     Home Medications Prior to Admission medications   Medication Sig Start Date End Date Taking? Authorizing Provider  ALPRAZolam Prudy Feeler) 0.5 MG tablet Take 1 tablet (0.5 mg total) by mouth 2 (two) times daily as needed for anxiety. 12/20/22   Cherie Ouch, PA-C  Dextromethorphan-buPROPion ER (AUVELITY) 45-105 MG TBCR Take 1 tablet by mouth 2 (two) times daily. 12/19/22   Melony Overly T, PA-C  estradiol (VIVELLE-DOT) 0.1 MG/24HR patch Apply 1 patch to the skin twice a week. 10/29/22     ibuprofen (ADVIL,MOTRIN) 200 MG tablet Take 600 mg by mouth every 6 (six) hours as needed for headache, mild pain or moderate pain.    [provider]  lurasidone (LATUDA) 40 MG TABS tablet Take 1 tablet (40 mg total) by mouth daily with supper. 01/07/23   Melony Overly T, PA-C  metroNIDAZOLE (METROCREAM) 0.75 % cream Apply as directed to affected area twice a day; Use of SPF is recommended. 06/16/21     omeprazole (PRILOSEC) 10 MG capsule Take 10 mg by mouth daily.    [provider]  progesterone (PROMETRIUM) 100 MG capsule Take 1 capsule every day by oral route at bedtime. 08/02/21     progesterone (PROMETRIUM) 100 MG capsule Take 1 capsule (100 mg total) by mouth at bedtime. 10/10/22     triamcinolone cream (KENALOG) 0.1 % Apply 1 Application topically at bedtime. Apply to area on right lower leg as needed. 01/15/23     Vilazodone HCl (VIIBRYD) 40 MG TABS Take 1 tablet by mouth daily. 07/26/22   Melony Overly T, PA-C  Vitamin D, Cholecalciferol, 25 MCG (1000 UT) CAPS Take by mouth.    [provider]  Vitamin D, Ergocalciferol, (DRISDOL) 1.25 MG (50000 UNIT) CAPS capsule Take 1 capsule (50,000 Units total) by mouth every 7 (seven) days. 10/16/22   Melony Overly T, PA-C  zolpidem (AMBIEN) 10 MG tablet Take 1/2 - 1 tablet by mouth at bedtime as needed for sleep. 04/25/22   Cherie Ouch, PA-C      Allergies    Patient has no known allergies.    Review of Systems   Review of Systems  Constitutional:  Negative for chills and fever.  Respiratory:  Negative for shortness of breath.   Cardiovascular:  Negative for chest pain.  Psychiatric/Behavioral:  Positive for dysphoric mood and suicidal ideas. Negative for hallucinations. The patient is nervous/anxious.     Physical Exam Updated Vital Signs BP (!) 133/90 (BP Location: Left Arm)   Pulse 76   Temp 98.6 F (37 C) (Oral)   Resp 16   Ht 5\' 5"  (1.651 m)   Wt 99.8 kg   SpO2 97%   BMI 36.61 kg/m  Physical Exam Vitals and nursing note reviewed.  Constitutional:      Appearance: Normal appearance. She is not toxic-appearing.     Comments: Tearful  Eyes:     General: No scleral icterus. Pulmonary:     Effort: Pulmonary effort is normal. No respiratory distress.  Neurological:      General: No focal deficit present.     Mental Status: She is alert. Mental status is at baseline.  Psychiatric:        Attention and Perception: She does not perceive auditory or visual hallucinations.        Mood and Affect: Affect is tearful.        Speech: Speech is not rapid and pressured.        Thought Content: Thought content includes suicidal ideation. Thought content does not include homicidal ideation. Thought content does not include homicidal or suicidal plan.     Comments: Does not appear to be responding to any internal stimuli. The patient reports passive SI with "not wanting to wake up" or to just "disappear". Denies any homicidal ideation or suicidal plan. Appears tearful and agitated at times.      ED Results / Procedures / Treatments   Labs (all labs ordered are listed, but only abnormal results are displayed) Labs Reviewed  COMPREHENSIVE METABOLIC PANEL - Abnormal; Notable for the following components:      Result Value   Creatinine, Ser 1.02 (*)    Calcium 8.6 (*)    All other components within normal limits  SALICYLATE LEVEL - Abnormal; Notable for the following components:   Salicylate Lvl <7.0 (*)    All other components within normal limits  ACETAMINOPHEN LEVEL - Abnormal; Notable for the following components:   Acetaminophen (Tylenol), Serum <10 (*)    All other components within normal limits  ETHANOL  CBC  RAPID URINE DRUG SCREEN, HOSP PERFORMED  HCG, SERUM, QUALITATIVE    EKG None  Radiology No results found.  Procedures Procedures   Medications Ordered in ED Medications - No data to display  ED Course/ Medical Decision Making/ A&P                                Medical Decision Making Amount and/or Complexity of Data Reviewed Labs: ordered.   57 y.o. female presents to the ER for evaluation of suicidal ideation/emotional labile. Differential diagnosis includes but is not limited to psych. Vital signs mildly elevated BP, otherwise  unremarkable. Physical exam as noted above.   I independently reviewed and interpreted the patient's labs.  UDS is negative.  CMP shows creatinine slightly elevated at 1.02 however appears around the baseline.  Mildly decreased calcium 8.6, otherwise no electrolyte or LFT abnormalities.  Negative ethanol, acetaminophen, and salicylate levels.  hCG is negative.  CBC without leukocytosis or anemia.  EKG shows QT of 427.  Patient is medically clear for psych evaluation.   Per Alona Bene, PMHNP- the patient has been psych cleared as well. No medication recommendations at this time. The patient  reports that she understands the plan with follow up with her psychiatrist and gyn about her symptoms. She contracted for safety and feels good about going home. She is not tearful and does appear in better spirits. I did message her psych PA that she sees to let her know of her visit today to see if she can be seen sooner for medication management.  We discussed the results of the labs/imaging. The plan is follow with psychiatry. We discussed strict return precautions and red flag symptoms. The patient verbalized their understanding and agrees to the plan. The patient is stable and being discharged home in good condition.  Portions of this report may have been transcribed using voice recognition software. Every effort was made to ensure accuracy; however, inadvertent computerized transcription errors may be present.   Final Clinical Impression(s) / ED Diagnoses Final diagnoses:  Suicidal thoughts    Rx / DC Orders ED Discharge Orders     None         Achille Rich, PA-C 01/31/23 1546    Laurence Spates, MD 02/02/23 0700

## 2023-01-31 NOTE — ED Triage Notes (Addendum)
Pt has hx of depression, pt currently going through menopause and has started a new job. Pt expresses that she has been struggling with her depression more, and does endorse thoughts of wanting to hurt herself. Pt denies any specific plan. Pt is an employee of [employer].

## 2023-02-02 DIAGNOSIS — G4733 Obstructive sleep apnea (adult) (pediatric): Secondary | ICD-10-CM | POA: Diagnosis not present

## 2023-02-04 ENCOUNTER — Other Ambulatory Visit (HOSPITAL_BASED_OUTPATIENT_CLINIC_OR_DEPARTMENT_OTHER): Payer: Self-pay

## 2023-02-04 ENCOUNTER — Other Ambulatory Visit (HOSPITAL_COMMUNITY): Payer: Self-pay

## 2023-02-04 ENCOUNTER — Ambulatory Visit (INDEPENDENT_AMBULATORY_CARE_PROVIDER_SITE_OTHER): Payer: Commercial Managed Care - PPO | Admitting: Physician Assistant

## 2023-02-04 ENCOUNTER — Other Ambulatory Visit: Payer: Self-pay

## 2023-02-04 ENCOUNTER — Encounter: Payer: Self-pay | Admitting: Physician Assistant

## 2023-02-04 DIAGNOSIS — F329 Major depressive disorder, single episode, unspecified: Secondary | ICD-10-CM

## 2023-02-04 DIAGNOSIS — R5381 Other malaise: Secondary | ICD-10-CM | POA: Diagnosis not present

## 2023-02-04 DIAGNOSIS — G4700 Insomnia, unspecified: Secondary | ICD-10-CM | POA: Diagnosis not present

## 2023-02-04 DIAGNOSIS — F411 Generalized anxiety disorder: Secondary | ICD-10-CM | POA: Diagnosis not present

## 2023-02-04 DIAGNOSIS — R5383 Other fatigue: Secondary | ICD-10-CM | POA: Diagnosis not present

## 2023-02-04 MED ORDER — LITHIUM CARBONATE 300 MG PO TABS
300.0000 mg | ORAL_TABLET | Freq: Every evening | ORAL | 0 refills | Status: DC
  Filled 2023-02-04 (×3): qty 30, 30d supply, fill #0

## 2023-02-04 NOTE — Progress Notes (Unsigned)
[facility]  Patient ID: JAZLINE CUMBEE,  MRN: [id number]  PCP: Sonny Masters, FNP  Date of Evaluation: 02/04/2023 Time spent:30 minutes  Chief Complaint:  Chief Complaint   Depression    HISTORY/CURRENT STATUS: HPI For routine med check.  Tyffany went to the ER on 01/31/2023 with suicidal thoughts.  She stated she did not want to exist anymore.  She had no plan. She was at work when she started 'feeling out of it and anxious.' She didn't know what the next step was to do on the computer. About 2 weeks ago, she and her co-workers got an email about some things they need to document in the chart.       Denies dizziness, syncope, seizures, numbness, tingling, tremor, tics, unsteady gait, slurred speech, confusion. Denies muscle or joint pain, stiffness, or dystonia.  Individual Medical History/ Review of Systems: Changes? :No     Past medications for mental health diagnoses include: Zoloft, Prozac, Paxil, Abilify, Effexor, Celexa, Cymbalta, Lamictal, Ambien, melatonin, Wellbutrin SR, Trintellix, Viibryd, Cerefolin NAC (not sure if effective at all plus was expensive) Auvelity  Allergies: Patient has no known allergies.  Current Medications:  Current Outpatient Medications:    ALPRAZolam (XANAX) 0.5 MG tablet, Take 1 tablet (0.5 mg total) by mouth 2 (two) times daily as needed for anxiety., Disp: 30 tablet, Rfl: 5   Dextromethorphan-buPROPion ER (AUVELITY) 45-105 MG TBCR, Take 1 tablet by mouth 2 (two) times daily., Disp: 180 tablet, Rfl: 0   estradiol (VIVELLE-DOT) 0.1 MG/24HR patch, Apply 1 patch to the skin twice a week., Disp: 24 patch, Rfl: 1   ibuprofen (ADVIL,MOTRIN) 200 MG tablet, Take 600 mg by mouth every 6 (six) hours as needed for headache, mild pain or moderate pain., Disp: , Rfl:    lithium 300 MG tablet, Take 1 tablet (300 mg total) by mouth at bedtime., Disp: 30 tablet, Rfl: 0   lurasidone (LATUDA) 40 MG TABS tablet, Take 1 tablet (40 mg total) by  mouth daily with supper., Disp: 30 tablet, Rfl: 1   metroNIDAZOLE (METROCREAM) 0.75 % cream, Apply as directed to affected area twice a day; Use of SPF is recommended., Disp: 45 g, Rfl: 1   omeprazole (PRILOSEC) 10 MG capsule, Take 10 mg by mouth daily., Disp: , Rfl:    progesterone (PROMETRIUM) 100 MG capsule, Take 1 capsule every day by oral route at bedtime., Disp: 30 capsule, Rfl: 11   progesterone (PROMETRIUM) 100 MG capsule, Take 1 capsule (100 mg total) by mouth at bedtime., Disp: 90 capsule, Rfl: 2   Semaglutide,0.25 or 0.5MG /DOS, (OZEMPIC, 0.25 OR 0.5 MG/DOSE,) 2 MG/1.5ML SOPN, Inject into the skin., Disp: , Rfl:    triamcinolone cream (KENALOG) 0.1 %, Apply 1 Application topically at bedtime. Apply to area on right lower leg as needed., Disp: 80 g, Rfl: 1   Vilazodone HCl (VIIBRYD) 40 MG TABS, Take 1 tablet by mouth daily., Disp: 90 tablet, Rfl: 3   Vitamin D, Cholecalciferol, 25 MCG (1000 UT) CAPS, Take by mouth., Disp: , Rfl:    Vitamin D, Ergocalciferol, (DRISDOL) 1.25 MG (50000 UNIT) CAPS capsule, Take 1 capsule (50,000 Units total) by mouth every 7 (seven) days., Disp: 12 capsule, Rfl: 0   zolpidem (AMBIEN) 10 MG tablet, Take 1/2 - 1 tablet by mouth at bedtime as needed for sleep., Disp: 30 tablet, Rfl: 4 Medication Side Effects: none  Family Medical/ Social History: Changes?  No   MENTAL HEALTH EXAM:  There were no vitals taken  for this visit.There is no height or weight on file to calculate BMI.  General Appearance: Casual, Neat and Well Groomed  Eye Contact:  Good  Speech:  Clear and Coherent and Normal Rate  Volume:  Normal  Mood:  Euthymic  Affect:  Congruent  Thought Process:  Goal Directed and Descriptions of Associations: Circumstantial  Orientation:  Full (Time, Place, and Person)  Thought Content: Logical   Suicidal Thoughts:  No  Homicidal Thoughts:  No  Memory:  WNL  Judgement:  Good  Insight:  Good  Psychomotor Activity:  Normal  Concentration:   Concentration: Good and Attention Span: Good  Recall:  Good  Fund of Knowledge: Good  Language: Good  Assets:  Communication Skills Desire for Improvement Financial Resources/Insurance Housing Transportation Vocational/Educational  ADL's:  Intact  Cognition: WNL  Prognosis:  Good   GeneSight results on chart, scanned under labs.   ECT-MADRS    Flowsheet Row Office Visit from 02/04/2023 in [facility] Office Visit from 09/22/2021 in [facility] Office Visit from 08/22/2021 in [facility]  MADRS Total Score 40 4 36      GAD-7    Flowsheet Row Office Visit from 05/05/2021 in [facility] Office Visit from 12/29/2020 in [facility] Office Visit from 05/14/2018 in [facility]  Total GAD-7 Score 1 1 0      PHQ2-9    Flowsheet Row Office Visit from 05/05/2021 in [facility] Office Visit from 12/29/2020 in [facility] Office Visit from 12/25/2018 in [facility] Office Visit from 05/14/2018 in [facility] Office Visit from 05/08/2018 in [facility]  PHQ-2 Total Score 2 1 0 0 0  PHQ-9 Total Score 4 3 -- 0 --      Flowsheet Row ED from 01/31/2023 in [facility] Department at [facility] ED from 05/30/2021 in [facility] Emergency Department at [facility]  C-SSRS RISK CATEGORY Low Risk No Risk      DIAGNOSES:  No diagnosis found.  Receiving Psychotherapy: Yes     RECOMMENDATIONS:  PDMP was reviewed.  Ambien filled 07/30/2022.  Xanax filled 12/20/2022. I provided 30 minutes of face to face time during this encounter, including time spent before and after the visit in records review, medical decision making,  counseling pertinent to today's visit, and charting.    Spravato,  Lithium   Continue Xanax 0.5 mg, 1 p.o. twice daily as needed. Continue Auvelity 45/105 mg, 1 p.o. twice daily.   Increase Latuda to 40 mg w/ supper. Continue Viibryd 40 mg p.o. daily.   Continue Ambien 10 mg, 1/2-1 nightly as needed sleep. Continue vitamins as per med list.  Continue counseling. Return in   [place], [place]

## 2023-02-05 ENCOUNTER — Telehealth: Payer: Self-pay

## 2023-02-05 ENCOUNTER — Encounter: Payer: Self-pay | Admitting: Physician Assistant

## 2023-02-12 ENCOUNTER — Other Ambulatory Visit (HOSPITAL_BASED_OUTPATIENT_CLINIC_OR_DEPARTMENT_OTHER): Payer: Self-pay

## 2023-02-13 DIAGNOSIS — Z9884 Bariatric surgery status: Secondary | ICD-10-CM | POA: Diagnosis not present

## 2023-02-13 DIAGNOSIS — E66812 Obesity, class 2: Secondary | ICD-10-CM | POA: Diagnosis not present

## 2023-02-13 DIAGNOSIS — K2100 Gastro-esophageal reflux disease with esophagitis, without bleeding: Secondary | ICD-10-CM | POA: Diagnosis not present

## 2023-02-15 ENCOUNTER — Other Ambulatory Visit (HOSPITAL_BASED_OUTPATIENT_CLINIC_OR_DEPARTMENT_OTHER): Payer: Self-pay

## 2023-02-19 ENCOUNTER — Ambulatory Visit: Payer: Commercial Managed Care - PPO | Admitting: Physician Assistant

## 2023-02-20 ENCOUNTER — Encounter: Payer: Self-pay | Admitting: Physician Assistant

## 2023-02-20 ENCOUNTER — Other Ambulatory Visit (HOSPITAL_BASED_OUTPATIENT_CLINIC_OR_DEPARTMENT_OTHER): Payer: Self-pay

## 2023-02-20 ENCOUNTER — Ambulatory Visit (INDEPENDENT_AMBULATORY_CARE_PROVIDER_SITE_OTHER): Payer: Commercial Managed Care - PPO | Admitting: Physician Assistant

## 2023-02-20 DIAGNOSIS — R7989 Other specified abnormal findings of blood chemistry: Secondary | ICD-10-CM

## 2023-02-20 DIAGNOSIS — F329 Major depressive disorder, single episode, unspecified: Secondary | ICD-10-CM

## 2023-02-20 DIAGNOSIS — G4700 Insomnia, unspecified: Secondary | ICD-10-CM | POA: Diagnosis not present

## 2023-02-20 DIAGNOSIS — F411 Generalized anxiety disorder: Secondary | ICD-10-CM

## 2023-02-20 MED ORDER — LURASIDONE HCL 40 MG PO TABS
40.0000 mg | ORAL_TABLET | Freq: Every day | ORAL | 1 refills | Status: DC
  Filled 2023-02-20 – 2023-03-19 (×2): qty 90, 90d supply, fill #0
  Filled 2023-03-20: qty 60, 60d supply, fill #0
  Filled 2023-03-20: qty 30, 30d supply, fill #0
  Filled 2023-04-04 – 2023-06-14 (×2): qty 90, 90d supply, fill #0

## 2023-02-20 MED ORDER — VITAMIN D (ERGOCALCIFEROL) 1.25 MG (50000 UNIT) PO CAPS
50000.0000 [IU] | ORAL_CAPSULE | ORAL | 3 refills | Status: DC
  Filled 2023-02-20 – 2023-04-25 (×2): qty 12, 84d supply, fill #0
  Filled 2023-07-16: qty 12, 84d supply, fill #1
  Filled 2023-10-18: qty 12, 84d supply, fill #2
  Filled 2023-12-29: qty 12, 84d supply, fill #3

## 2023-02-20 NOTE — Progress Notes (Signed)
[facility]  Patient ID: Aimee Hart,  MRN: [id number]  PCP: Sonny Masters, FNP  Date of Evaluation: 02/20/2023 Time spent:20 minutes  Chief Complaint:  Chief Complaint   Depression; Follow-up    HISTORY/CURRENT STATUS: HPI For routine med check.  Not ruminating as much, calmer, and not as in such a dark place with suicidal thoughts as she was a few weeks ago when we started lithium.  She has not had any side effects from it.  She still feels "down" but not nearly as bad as before.  She still does not have any energy or motivation.  She did go to her granddaughter's birthday party.  She is a-year-old now.  Aimee Hart is looking forward to going to [place] this weekend to visit her boyfriend's family.  Work is going well.   No extreme sadness, tearfulness, or feelings of hopelessness.  Sleeps well most of the time. ADLs and personal hygiene are normal.   Denies any changes in concentration, making decisions, or remembering things.  Appetite has not changed.  Weight is stable.  Xanax is helpful when needed.  She is not having panic attacks but does still get overwhelmed.  Denies suicidal or homicidal thoughts.   Patient denies increased energy with decreased need for sleep, increased talkativeness, racing thoughts, impulsivity or risky behaviors, increased spending, increased libido, grandiosity, increased irritability or anger, paranoia, or hallucinations.  Denies dizziness, syncope, seizures, numbness, tingling, tremor, tics, unsteady gait, slurred speech, confusion. Denies muscle or joint pain, stiffness, or dystonia. Denies unexplained weight loss, frequent infections, or sores that heal slowly.  No polyphagia, polydipsia, or polyuria. Denies visual changes or paresthesias.   Individual Medical History/ Review of Systems: Changes? :No     Past medications for mental health diagnoses include: Zoloft, Prozac, Paxil, Abilify, Effexor, Celexa, Cymbalta, Lamictal, Ambien,  melatonin, Wellbutrin SR, Trintellix, Viibryd, Cerefolin NAC (not sure if effective at all plus was expensive) Auvelity  Allergies: Patient has no known allergies.  Current Medications:  Current Outpatient Medications:    ALPRAZolam (XANAX) 0.5 MG tablet, Take 1 tablet (0.5 mg total) by mouth 2 (two) times daily as needed for anxiety., Disp: 30 tablet, Rfl: 5   Dextromethorphan-buPROPion ER (AUVELITY) 45-105 MG TBCR, Take 1 tablet by mouth 2 (two) times daily., Disp: 180 tablet, Rfl: 0   estradiol (VIVELLE-DOT) 0.1 MG/24HR patch, Apply 1 patch to the skin twice a week., Disp: 24 patch, Rfl: 1   ibuprofen (ADVIL,MOTRIN) 200 MG tablet, Take 600 mg by mouth every 6 (six) hours as needed for headache, mild pain or moderate pain., Disp: , Rfl:    lithium 300 MG tablet, Take 1 tablet (300 mg total) by mouth at bedtime., Disp: 30 tablet, Rfl: 0   metroNIDAZOLE (METROCREAM) 0.75 % cream, Apply as directed to affected area twice a day; Use of SPF is recommended., Disp: 45 g, Rfl: 1   omeprazole (PRILOSEC) 10 MG capsule, Take 10 mg by mouth daily., Disp: , Rfl:    progesterone (PROMETRIUM) 100 MG capsule, Take 1 capsule every day by oral route at bedtime., Disp: 30 capsule, Rfl: 11   progesterone (PROMETRIUM) 100 MG capsule, Take 1 capsule (100 mg total) by mouth at bedtime., Disp: 90 capsule, Rfl: 2   Semaglutide,0.25 or 0.5MG /DOS, (OZEMPIC, 0.25 OR 0.5 MG/DOSE,) 2 MG/1.5ML SOPN, Inject into the skin., Disp: , Rfl:    Vilazodone HCl (VIIBRYD) 40 MG TABS, Take 1 tablet by mouth daily., Disp: 90 tablet, Rfl: 3   Vitamin D,  Cholecalciferol, 25 MCG (1000 UT) CAPS, Take by mouth., Disp: , Rfl:    zolpidem (AMBIEN) 10 MG tablet, Take 1/2 - 1 tablet by mouth at bedtime as needed for sleep., Disp: 30 tablet, Rfl: 4   lurasidone (LATUDA) 40 MG TABS tablet, Take 1 tablet (40 mg total) by mouth daily with supper., Disp: 90 tablet, Rfl: 1   triamcinolone cream (KENALOG) 0.1 %, Apply 1 Application topically at  bedtime. Apply to area on right lower leg as needed. (Patient not taking: Reported on 02/20/2023), Disp: 80 g, Rfl: 1   Vitamin D, Ergocalciferol, (DRISDOL) 1.25 MG (50000 UNIT) CAPS capsule, Take 1 capsule (50,000 Units total) by mouth every 7 (seven) days., Disp: 12 capsule, Rfl: 3 Medication Side Effects: none  Family Medical/ Social History: Changes?  No   MENTAL HEALTH EXAM:  There were no vitals taken for this visit.There is no height or weight on file to calculate BMI.  General Appearance: Casual, Neat and Well Groomed  Eye Contact:  Good  Speech:  Clear and Coherent and Normal Rate  Volume:  Normal  Mood:   Sad  Affect:  Congruent  Thought Process:  Goal Directed and Descriptions of Associations: Circumstantial  Orientation:  Full (Time, Place, and Person)  Thought Content: Logical   Suicidal Thoughts:  No  Homicidal Thoughts:  No  Memory:  WNL  Judgement:  Good  Insight:  Good  Psychomotor Activity:  Normal  Concentration:  Concentration: Good and Attention Span: Good  Recall:  Good  Fund of Knowledge: Good  Language: Good  Assets:  Communication Skills Desire for Improvement Financial Resources/Insurance Housing Transportation Vocational/Educational  ADL's:  Intact  Cognition: WNL  Prognosis:  Good   GeneSight results on chart, scanned under labs.   ECT-MADRS    Flowsheet Row [facility]04/[facility] Office Visit from 09/22/2021 in [facility] Office Visit from 08/22/2021 in [facility]  MADRS Total Score 40 4 36      GAD-7    Flowsheet Row Office Visit from 05/05/2021 in [facility] Office Visit from 12/29/2020 in [facility] Office Visit from 05/14/2018 in [facility]  Total GAD-7 Score 1 1 0      PHQ2-9    Flowsheet Row Office Visit from 05/05/2021  in [facility] Office Visit from 12/29/2020 in [facility] Office Visit from 12/25/2018 in [facility] Office Visit from 05/14/2018 in [facility] Office Visit from [facility]/08/2018 [facility]  PHQ-2 Total Score 2 1 0 0 0  PHQ-9 Total Score 4 3 -- 0 --      Flowsheet Row ED from 05/30/2021 in [facility] Department at [facility]  C-SSRS RISK CATEGORY No Risk      DIAGNOSES:    ICD-10-CM   1. Treatment-resistant depression  F32.9     2. Generalized anxiety disorder  F41.1     3. Insomnia, unspecified type  G47.00     4. Low vitamin D level  R79.89      Receiving Psychotherapy: Yes     RECOMMENDATIONS:  PDMP was reviewed.  Ambien filled 07/30/2022.  Xanax filled 12/20/2022. I provided 20 minutes of face to face time during this encounter, including time spent before and after the visit in  records review, medical decision making, counseling pertinent to today's visit, and charting.   The severe depression and suicidal thoughts have improved since adding the lithium 2 weeks ago.  I recommend that she continue that.  Prior authorization is in progress for her to start on Spravato.  As soon as that goes through then we will start treatment.  Continue Xanax 0.5 mg, 1 p.o. twice daily as needed. Continue Auvelity 45/105 mg, 1 p.o. twice daily.   Continue lithium 300 mg, 1 p.o. nightly. Continue Latuda 40 mg w/ supper. Continue Viibryd 40 mg p.o. daily.   Continue Ambien 10 mg, 1/2-1 nightly as needed sleep. Continue vitamins as per med list.  Continue counseling. Return in 4 weeks with me and start Spravato as soon as possible.  Melony Overly, PA-C

## 2023-02-27 NOTE — Progress Notes (Signed)
Sent message, via epic in basket, requesting orders in epic from surgeon.  

## 2023-02-28 ENCOUNTER — Ambulatory Visit: Payer: Self-pay | Admitting: General Surgery

## 2023-03-05 ENCOUNTER — Other Ambulatory Visit: Payer: Self-pay | Admitting: Physician Assistant

## 2023-03-05 DIAGNOSIS — G4733 Obstructive sleep apnea (adult) (pediatric): Secondary | ICD-10-CM | POA: Diagnosis not present

## 2023-03-05 NOTE — Patient Instructions (Signed)
SURGICAL WAITING ROOM VISITATION  Patients having surgery or a procedure may have no more than 2 support people in the waiting area - these visitors may rotate.    Children under the age of 65 must have an adult with them who is not the patient.  Due to an increase in RSV and influenza rates and associated hospitalizations, children ages 67 and under may not visit patients in [facility] hospitals.  If the patient needs to stay at the hospital during part of their recovery, the visitor guidelines for inpatient rooms apply. Pre-op nurse will coordinate an appropriate time for 1 support person to accompany patient in pre-op.  This support person may not rotate.    Please refer to the [facility] website for the visitor guidelines for Inpatients (after your surgery is over and you are in a regular room).    Your procedure is scheduled on: 03/11/23   Report to [facility] Main Entrance    Report to admitting at 8:45 AM   Call this number if you have problems the morning of surgery [phone]   Do not eat food :After Midnight.   After Midnight you may have the following liquids until 8:00 AM DAY OF SURGERY  Water Non-Citrus Juices (without pulp, NO RED-Apple, White grape, White cranberry) Black Coffee (NO MILK/CREAM OR CREAMERS, sugar ok)  Clear Tea (NO MILK/CREAM OR CREAMERS, sugar ok) regular and decaf                             Plain Jell-O (NO RED)                                           Fruit ices (not with fruit pulp, NO RED)                                     Popsicles (NO RED)                                                               Sports drinks like Gatorade (NO RED)                      If you have questions, please contact your surgeon's office.   FOLLOW BOWEL PREP AND ANY ADDITIONAL PRE OP INSTRUCTIONS YOU RECEIVED FROM YOUR SURGEON'S OFFICE!!!     Oral Hygiene is also important to reduce your risk of infection.                                     Remember - BRUSH YOUR TEETH THE MORNING OF SURGERY WITH YOUR REGULAR TOOTHPASTE  DENTURES WILL BE REMOVED PRIOR TO SURGERY PLEASE DO NOT APPLY "Poly grip" OR ADHESIVES!!!   Stop all vitamins and herbal supplements 7 days before surgery.   Take these medicines the morning of surgery with A SIP OF WATER: Xanax ,Omeprazole, Viibryd   Bring CPAP mask and tubing day of surgery.  You may not have any metal on your body including hair pins, jewelry, and body piercing             Do not wear make-up, lotions, powders, perfumes, or deodorant  Do not wear nail polish including gel and S&S, artificial/acrylic nails, or any other type of covering on natural nails including finger and toenails. If you have artificial nails, gel coating, etc. that needs to be removed by a nail salon please have this removed prior to surgery or surgery may need to be canceled/ delayed if the surgeon/ anesthesia feels like they are unable to be safely monitored.   Do not shave  48 hours prior to surgery.    Do not bring valuables to the hospital. [facility] IS NOT             RESPONSIBLE   FOR VALUABLES.   Contacts, glasses, dentures or bridgework may not be worn into surgery.  DO NOT BRING YOUR HOME MEDICATIONS TO THE HOSPITAL. PHARMACY WILL DISPENSE MEDICATIONS LISTED ON YOUR MEDICATION LIST TO YOU DURING YOUR ADMISSION IN THE HOSPITAL!    Patients discharged on the day of surgery will not be allowed to drive home.  Someone NEEDS to stay with you for the first 24 hours after anesthesia.              Please read over the following fact sheets you were given: IF YOU HAVE QUESTIONS ABOUT YOUR PRE-OP INSTRUCTIONS PLEASE CALL [name]   If you received a COVID test during your pre-op visit  it is requested that you wear a mask when out in public, stay away from anyone that may not be feeling well and notify your surgeon if you develop symptoms. If you test positive for Covid or  have been in contact with anyone that has tested positive in the last 10 days please notify you surgeon.    [place] - Preparing for Surgery Before surgery, you can play an important role.  Because skin is not sterile, your skin needs to be as free of germs as possible.  You can reduce the number of germs on your skin by washing with CHG (chlorahexidine gluconate) soap before surgery.  CHG is an antiseptic cleaner which kills germs and bonds with the skin to continue killing germs even after washing. Please DO NOT use if you have an allergy to CHG or antibacterial soaps.  If your skin becomes reddened/irritated stop using the CHG and inform your nurse when you arrive at Short Stay. Do not shave (including legs and underarms) for at least 48 hours prior to the first CHG shower.  You may shave your face/neck.  Please follow these instructions carefully:  1.  Shower with CHG Soap the night before surgery and the  morning of surgery.  2.  If you choose to wash your hair, wash your hair first as usual with your normal  shampoo.  3.  After you shampoo, rinse your hair and body thoroughly to remove the shampoo.                             4.  Use CHG as you would any other liquid soap.  You can apply chg directly to the skin and wash.  Gently with a scrungie or clean washcloth.  5.  Apply the CHG Soap to your body ONLY FROM THE NECK DOWN.   Do   not use on face/  open                           Wound or open sores. Avoid contact with eyes, ears mouth and   genitals (private parts).                       Wash face,  Genitals (private parts) with your normal soap.             6.  Wash thoroughly, paying special attention to the area where your    surgery  will be performed.  7.  Thoroughly rinse your body with warm water from the neck down.  8.  DO NOT shower/wash with your normal soap after using and rinsing off the CHG Soap.                9.  Pat yourself dry with a clean towel.            10.  Wear  clean pajamas.            11.  Place clean sheets on your bed the night of your first shower and do not  sleep with pets. Day of Surgery : Do not apply any lotions/deodorants the morning of surgery.  Please wear clean clothes to the hospital/surgery center.  FAILURE TO FOLLOW THESE INSTRUCTIONS MAY RESULT IN THE CANCELLATION OF YOUR SURGERY  PATIENT SIGNATURE_________________________________  NURSE SIGNATURE__________________________________  ________________________________________________________________________

## 2023-03-05 NOTE — Progress Notes (Signed)
COVID Vaccine Completed:  Date of COVID positive in last 90 days:  PCP - Gilford Silvius, FNP Cardiologist - Carolan Clines, MD LOV 06/02/21 for palpitations and syncope no f/u needed  Chest x-ray -  EKG - 02/01/23 Epic Stress Test -  ECHO -  Cardiac Cath -  Pacemaker/ICD device last checked: Spinal Cord Stimulator:  Bowel Prep -   Sleep Study - OSA CPAP - yes  Fasting Blood Sugar -  Checks Blood Sugar _____ times a day  Last dose of GLP1 agonist-  N/A ?? GLP1 instructions:  Hold 7 days before surgery    Last dose of SGLT-2 inhibitors-  N/A SGLT-2 instructions:  Hold 3 days before surgery    Blood Thinner Instructions:  Time Aspirin Instructions: Last Dose:  Activity level:  Can go up a flight of stairs and perform activities of daily living without stopping and without symptoms of chest pain or shortness of breath.  Able to exercise without symptoms  Unable to go up a flight of stairs without symptoms of     Anesthesia review:   Patient denies shortness of breath, fever, cough and chest pain at PAT appointment  Patient verbalized understanding of instructions that were given to them at the PAT appointment. Patient was also instructed that they will need to review over the PAT instructions again at home before surgery.

## 2023-03-06 ENCOUNTER — Other Ambulatory Visit (HOSPITAL_COMMUNITY): Payer: Self-pay

## 2023-03-06 ENCOUNTER — Other Ambulatory Visit: Payer: Self-pay

## 2023-03-06 ENCOUNTER — Encounter (HOSPITAL_COMMUNITY)
Admission: RE | Admit: 2023-03-06 | Discharge: 2023-03-06 | Disposition: A | Discharge status: Home / Self Care | Payer: Commercial Managed Care - PPO | Source: Ambulatory Visit | Attending: General Surgery | Admitting: General Surgery

## 2023-03-06 ENCOUNTER — Encounter (HOSPITAL_COMMUNITY): Payer: Self-pay

## 2023-03-06 ENCOUNTER — Other Ambulatory Visit (HOSPITAL_BASED_OUTPATIENT_CLINIC_OR_DEPARTMENT_OTHER): Payer: Self-pay

## 2023-03-06 VITALS — BP 127/75 | HR 77 | Temp 98.3°F | Resp 16 | Ht 65.0 in | Wt 215.0 lb

## 2023-03-06 DIAGNOSIS — R0602 Shortness of breath: Secondary | ICD-10-CM | POA: Diagnosis not present

## 2023-03-06 DIAGNOSIS — Z01812 Encounter for preprocedural laboratory examination: Secondary | ICD-10-CM | POA: Insufficient documentation

## 2023-03-06 HISTORY — DX: Malignant (primary) neoplasm, unspecified: C80.1

## 2023-03-06 LAB — BASIC METABOLIC PANEL
Anion gap: 8 (ref 5–15)
BUN: 19 mg/dL (ref 6–20)
CO2: 23 mmol/L (ref 22–32)
Calcium: 8.7 mg/dL — ABNORMAL LOW (ref 8.9–10.3)
Chloride: 106 mmol/L (ref 98–111)
Creatinine, Ser: 1.03 mg/dL — ABNORMAL HIGH (ref 0.44–1.00)
GFR, Estimated: >60 mL/min (ref >60)
Glucose, Bld: 105 mg/dL — ABNORMAL HIGH (ref 70–99)
Potassium: 3.7 mmol/L (ref 3.5–5.1)
Sodium: 137 mmol/L (ref 135–145)

## 2023-03-06 LAB — CBC
HCT: 43 % (ref 36.0–46.0)
Hemoglobin: 13.4 g/dL (ref 12.0–15.0)
MCH: 26.7 pg (ref 26.0–34.0)
MCHC: 31.2 g/dL (ref 30.0–36.0)
MCV: 85.7 fL (ref 80.0–100.0)
Platelets: 304 10*3/uL (ref 150–400)
RBC: 5.02 MIL/uL (ref 3.87–5.11)
RDW: 14.4 % (ref 11.5–15.5)
WBC: 7.4 10*3/uL (ref 4.0–10.5)
nRBC: 0 % (ref 0.0–0.2)

## 2023-03-06 MED ORDER — LITHIUM CARBONATE 300 MG PO TABS
300.0000 mg | ORAL_TABLET | Freq: Every evening | ORAL | 2 refills | Status: DC
  Filled 2023-03-06: qty 30, 30d supply, fill #0
  Filled 2023-03-31: qty 30, 30d supply, fill #1
  Filled 2023-04-03: qty 20, 20d supply, fill #1
  Filled 2023-04-03 (×2): qty 30, 30d supply, fill #1
  Filled 2023-04-03: qty 10, 10d supply, fill #1
  Filled 2023-04-03 (×2): qty 30, 30d supply, fill #1

## 2023-03-07 ENCOUNTER — Other Ambulatory Visit (HOSPITAL_BASED_OUTPATIENT_CLINIC_OR_DEPARTMENT_OTHER): Payer: Self-pay

## 2023-03-08 ENCOUNTER — Other Ambulatory Visit (HOSPITAL_COMMUNITY): Payer: Self-pay

## 2023-03-11 ENCOUNTER — Encounter (HOSPITAL_COMMUNITY): Payer: Self-pay | Admitting: General Surgery

## 2023-03-11 ENCOUNTER — Ambulatory Visit (HOSPITAL_COMMUNITY)
Admission: RE | Admit: 2023-03-11 | Discharge: 2023-03-11 | Disposition: A | Discharge status: Home / Self Care | Payer: Commercial Managed Care - PPO | Attending: General Surgery | Admitting: General Surgery

## 2023-03-11 ENCOUNTER — Other Ambulatory Visit (HOSPITAL_COMMUNITY): Payer: Self-pay

## 2023-03-11 ENCOUNTER — Ambulatory Visit (HOSPITAL_BASED_OUTPATIENT_CLINIC_OR_DEPARTMENT_OTHER): Payer: Commercial Managed Care - PPO | Admitting: Certified Registered Nurse Anesthetist

## 2023-03-11 ENCOUNTER — Other Ambulatory Visit: Payer: Self-pay

## 2023-03-11 ENCOUNTER — Ambulatory Visit (HOSPITAL_COMMUNITY): Payer: Commercial Managed Care - PPO | Admitting: Certified Registered Nurse Anesthetist

## 2023-03-11 ENCOUNTER — Encounter (HOSPITAL_COMMUNITY)
Admission: RE | Disposition: A | Discharge status: Home / Self Care | Payer: Self-pay | Source: Home / Self Care | Attending: General Surgery

## 2023-03-11 DIAGNOSIS — R1310 Dysphagia, unspecified: Secondary | ICD-10-CM | POA: Diagnosis not present

## 2023-03-11 DIAGNOSIS — Z7985 Long-term (current) use of injectable non-insulin antidiabetic drugs: Secondary | ICD-10-CM | POA: Insufficient documentation

## 2023-03-11 DIAGNOSIS — G4730 Sleep apnea, unspecified: Secondary | ICD-10-CM | POA: Insufficient documentation

## 2023-03-11 DIAGNOSIS — F32A Depression, unspecified: Secondary | ICD-10-CM | POA: Insufficient documentation

## 2023-03-11 DIAGNOSIS — Z4651 Encounter for fitting and adjustment of gastric lap band: Secondary | ICD-10-CM | POA: Diagnosis not present

## 2023-03-11 DIAGNOSIS — R1319 Other dysphagia: Secondary | ICD-10-CM | POA: Diagnosis not present

## 2023-03-11 DIAGNOSIS — Z87891 Personal history of nicotine dependence: Secondary | ICD-10-CM | POA: Insufficient documentation

## 2023-03-11 DIAGNOSIS — F411 Generalized anxiety disorder: Secondary | ICD-10-CM | POA: Diagnosis not present

## 2023-03-11 DIAGNOSIS — E66812 Obesity, class 2: Secondary | ICD-10-CM | POA: Insufficient documentation

## 2023-03-11 DIAGNOSIS — Z9884 Bariatric surgery status: Secondary | ICD-10-CM | POA: Diagnosis not present

## 2023-03-11 DIAGNOSIS — K2100 Gastro-esophageal reflux disease with esophagitis, without bleeding: Secondary | ICD-10-CM | POA: Diagnosis not present

## 2023-03-11 DIAGNOSIS — K9509 Other complications of gastric band procedure: Secondary | ICD-10-CM | POA: Diagnosis not present

## 2023-03-11 DIAGNOSIS — Z79899 Other long term (current) drug therapy: Secondary | ICD-10-CM | POA: Insufficient documentation

## 2023-03-11 DIAGNOSIS — Z6835 Body mass index (BMI) 35.0-35.9, adult: Secondary | ICD-10-CM | POA: Diagnosis not present

## 2023-03-11 DIAGNOSIS — K5900 Constipation, unspecified: Secondary | ICD-10-CM | POA: Diagnosis not present

## 2023-03-11 SURGERY — REMOVAL, GASTRIC BAND, LAPAROSCOPIC
Anesthesia: General

## 2023-03-11 MED ORDER — FENTANYL CITRATE PF 50 MCG/ML IJ SOSY
25.0000 ug | PREFILLED_SYRINGE | INTRAMUSCULAR | Status: DC | PRN
Start: 2023-03-11 — End: 2023-03-11
  Administered 2023-03-11: 50 ug via INTRAVENOUS

## 2023-03-11 MED ORDER — OXYCODONE HCL 5 MG PO TABS
5.0000 mg | ORAL_TABLET | Freq: Once | ORAL | Status: DC | PRN
Start: 2023-03-11 — End: 2023-03-11

## 2023-03-11 MED ORDER — OXYCODONE HCL 5 MG/5ML PO SOLN: 5.0000 mg | Freq: Once | ORAL | Status: DC | PRN

## 2023-03-11 MED ORDER — CHLORHEXIDINE GLUCONATE CLOTH 2 % EX PADS: 6.0000 | MEDICATED_PAD | Freq: Once | CUTANEOUS | Status: DC

## 2023-03-11 MED ORDER — CEFAZOLIN SODIUM-DEXTROSE 2-4 GM/100ML-% IV SOLN
2.0000 g | INTRAVENOUS | Status: AC
  Administered 2023-03-11: 2 g via INTRAVENOUS
  Filled 2023-03-11: qty 100

## 2023-03-11 MED ORDER — MIDAZOLAM HCL 2 MG/2ML IJ SOLN
INTRAMUSCULAR | Status: AC
  Filled 2023-03-11: qty 2

## 2023-03-11 MED ORDER — PHENYLEPHRINE 80 MCG/ML (10ML) SYRINGE FOR IV PUSH (FOR BLOOD PRESSURE SUPPORT)
PREFILLED_SYRINGE | INTRAVENOUS | Status: DC | PRN
  Administered 2023-03-11 (×2): 80 ug via INTRAVENOUS

## 2023-03-11 MED ORDER — BUPIVACAINE HCL (PF) 0.25 % IJ SOLN
INTRAMUSCULAR | Status: AC
  Filled 2023-03-11: qty 30

## 2023-03-11 MED ORDER — FENTANYL CITRATE (PF) 250 MCG/5ML IJ SOLN
INTRAMUSCULAR | Status: AC
  Filled 2023-03-11: qty 5

## 2023-03-11 MED ORDER — LIDOCAINE 2% (20 MG/ML) 5 ML SYRINGE
INTRAMUSCULAR | Status: DC | PRN
  Administered 2023-03-11: 100 mg via INTRAVENOUS
  Administered 2023-03-11: 1.5 mg/kg/h via INTRAVENOUS

## 2023-03-11 MED ORDER — LACTATED RINGERS IV SOLN
INTRAVENOUS | Status: DC
Start: 2023-03-11 — End: 2023-03-11

## 2023-03-11 MED ORDER — ONDANSETRON HCL 4 MG/2ML IJ SOLN
INTRAMUSCULAR | Status: AC
Start: 2023-03-11 — End: ?
  Filled 2023-03-11: qty 2

## 2023-03-11 MED ORDER — PROPOFOL 10 MG/ML IV BOLUS
INTRAVENOUS | Status: AC
  Filled 2023-03-11: qty 20

## 2023-03-11 MED ORDER — SUGAMMADEX SODIUM 200 MG/2ML IV SOLN
INTRAVENOUS | Status: DC | PRN
  Administered 2023-03-11: 200 mg via INTRAVENOUS

## 2023-03-11 MED ORDER — MIDAZOLAM HCL 5 MG/5ML IJ SOLN
INTRAMUSCULAR | Status: DC | PRN
  Administered 2023-03-11: 2 mg via INTRAVENOUS

## 2023-03-11 MED ORDER — PROPOFOL 10 MG/ML IV BOLUS
INTRAVENOUS | Status: DC | PRN
  Administered 2023-03-11: 50 ug/kg/min via INTRAVENOUS
  Administered 2023-03-11: 180 mg via INTRAVENOUS

## 2023-03-11 MED ORDER — LIDOCAINE HCL (PF) 2 % IJ SOLN
INTRAMUSCULAR | Status: AC
  Filled 2023-03-11: qty 5

## 2023-03-11 MED ORDER — LIDOCAINE HCL 1 % IJ SOLN
INTRAMUSCULAR | Status: AC
  Filled 2023-03-11: qty 20

## 2023-03-11 MED ORDER — ACETAMINOPHEN 500 MG PO TABS
1000.0000 mg | ORAL_TABLET | ORAL | Status: AC
  Administered 2023-03-11: 1000 mg via ORAL
  Filled 2023-03-11: qty 2

## 2023-03-11 MED ORDER — OXYCODONE HCL 5 MG PO TABS
5.0000 mg | ORAL_TABLET | Freq: Four times a day (QID) | ORAL | 0 refills | Status: DC | PRN
  Filled 2023-03-11: qty 10, 3d supply, fill #0

## 2023-03-11 MED ORDER — ORAL CARE MOUTH RINSE: 15.0000 mL | Freq: Once | OROMUCOSAL | Status: AC

## 2023-03-11 MED ORDER — ROCURONIUM BROMIDE 10 MG/ML (PF) SYRINGE
PREFILLED_SYRINGE | INTRAVENOUS | Status: DC | PRN
  Administered 2023-03-11: 70 mg via INTRAVENOUS

## 2023-03-11 MED ORDER — DEXAMETHASONE SODIUM PHOSPHATE 10 MG/ML IJ SOLN
INTRAMUSCULAR | Status: AC
  Filled 2023-03-11: qty 1

## 2023-03-11 MED ORDER — BUPIVACAINE HCL 0.25 % IJ SOLN
INTRAMUSCULAR | Status: DC | PRN
Start: 2023-03-11 — End: 2023-03-11
  Administered 2023-03-11: 30 mL

## 2023-03-11 MED ORDER — 0.9 % SODIUM CHLORIDE (POUR BTL) OPTIME
TOPICAL | Status: DC | PRN
  Administered 2023-03-11: 1000 mL

## 2023-03-11 MED ORDER — SCOPOLAMINE 1 MG/3DAYS TD PT72
1.0000 | MEDICATED_PATCH | Freq: Once | TRANSDERMAL | Status: DC
  Administered 2023-03-11: 1.5 mg via TRANSDERMAL
  Filled 2023-03-11: qty 1

## 2023-03-11 MED ORDER — ONDANSETRON 4 MG PO TBDP
4.0000 mg | ORAL_TABLET | Freq: Four times a day (QID) | ORAL | 0 refills | Status: DC | PRN
  Filled 2023-03-11: qty 20, 5d supply, fill #0

## 2023-03-11 MED ORDER — FENTANYL CITRATE (PF) 250 MCG/5ML IJ SOLN
INTRAMUSCULAR | Status: DC | PRN
  Administered 2023-03-11: 100 ug via INTRAVENOUS
  Administered 2023-03-11: 50 ug via INTRAVENOUS

## 2023-03-11 MED ORDER — FENTANYL CITRATE PF 50 MCG/ML IJ SOSY
PREFILLED_SYRINGE | INTRAMUSCULAR | Status: AC
  Filled 2023-03-11: qty 1

## 2023-03-11 MED ORDER — PHENYLEPHRINE 80 MCG/ML (10ML) SYRINGE FOR IV PUSH (FOR BLOOD PRESSURE SUPPORT)
PREFILLED_SYRINGE | INTRAVENOUS | Status: AC
  Filled 2023-03-11: qty 10

## 2023-03-11 MED ORDER — DROPERIDOL 2.5 MG/ML IJ SOLN: 0.6250 mg | Freq: Once | INTRAMUSCULAR | Status: DC | PRN

## 2023-03-11 MED ORDER — PHENYLEPHRINE HCL-NACL 20-0.9 MG/250ML-% IV SOLN
INTRAVENOUS | Status: DC | PRN
  Administered 2023-03-11: 35 ug/min via INTRAVENOUS

## 2023-03-11 MED ORDER — CHLORHEXIDINE GLUCONATE 0.12 % MT SOLN
15.0000 mL | Freq: Once | OROMUCOSAL | Status: AC
  Administered 2023-03-11: 15 mL via OROMUCOSAL

## 2023-03-11 MED ORDER — ONDANSETRON HCL 4 MG/2ML IJ SOLN
INTRAMUSCULAR | Status: DC | PRN
  Administered 2023-03-11: 4 mg via INTRAVENOUS

## 2023-03-11 MED ORDER — CELECOXIB 200 MG PO CAPS
400.0000 mg | ORAL_CAPSULE | ORAL | Status: AC
  Administered 2023-03-11: 400 mg via ORAL
  Filled 2023-03-11: qty 2

## 2023-03-11 MED ORDER — ROCURONIUM BROMIDE 10 MG/ML (PF) SYRINGE
PREFILLED_SYRINGE | INTRAVENOUS | Status: AC
Start: 2023-03-11 — End: ?
  Filled 2023-03-11: qty 10

## 2023-03-11 MED ORDER — DEXAMETHASONE SODIUM PHOSPHATE 10 MG/ML IJ SOLN
INTRAMUSCULAR | Status: DC | PRN
  Administered 2023-03-11: 10 mg via INTRAVENOUS

## 2023-03-11 MED ORDER — PROPOFOL 500 MG/50ML IV EMUL
INTRAVENOUS | Status: AC
  Filled 2023-03-11: qty 50

## 2023-03-11 SURGICAL SUPPLY — 51 items
ANTIFOG SOL W/FOAM PAD STRL (MISCELLANEOUS) ×1
BAG COUNTER SPONGE SURGICOUNT (BAG) IMPLANT
BENZOIN TINCTURE PRP APPL 2/3 (GAUZE/BANDAGES/DRESSINGS) IMPLANT
BLADE SURG 15 STRL LF DISP TIS (BLADE) ×2 IMPLANT
BLADE SURG 15 STRL SS (BLADE) ×1
BLADE SURG SZ11 CARB STEEL (BLADE) ×2 IMPLANT
BNDG ADH 1X3 SHEER STRL LF (GAUZE/BANDAGES/DRESSINGS) ×12 IMPLANT
CABLE HIGH FREQUENCY MONO STRZ (ELECTRODE) IMPLANT
CHLORAPREP W/TINT 26 (MISCELLANEOUS) ×2 IMPLANT
DERMABOND ADVANCED .7 DNX12 (GAUZE/BANDAGES/DRESSINGS) IMPLANT
DEVICE SUTURE ENDOST 10MM (ENDOMECHANICALS) IMPLANT
DISSECTOR BLUNT TIP ENDO 5MM (MISCELLANEOUS) IMPLANT
DRAPE UTILITY XL STRL (DRAPES) ×2 IMPLANT
ELECT REM PT RETURN 15FT ADLT (MISCELLANEOUS) ×2 IMPLANT
GLOVE BIO SURGEON STRL SZ7.5 (GLOVE) ×2 IMPLANT
GLOVE INDICATOR 8.0 STRL GRN (GLOVE) ×2 IMPLANT
GOWN STRL REUS W/ TWL XL LVL3 (GOWN DISPOSABLE) ×4 IMPLANT
GOWN STRL REUS W/TWL XL LVL3 (GOWN DISPOSABLE) ×2
GRASPER SUT TROCAR 14GX15 (MISCELLANEOUS) IMPLANT
IRRIG SUCT STRYKERFLOW 2 WTIP (MISCELLANEOUS)
IRRIGATION SUCT STRKRFLW 2 WTP (MISCELLANEOUS) IMPLANT
KIT BASIN OR (CUSTOM PROCEDURE TRAY) ×2 IMPLANT
KIT TURNOVER KIT A (KITS) IMPLANT
MAT PREVALON FULL STRYKER (MISCELLANEOUS) ×2 IMPLANT
NDL SPNL 22GX3.5 QUINCKE BK (NEEDLE) ×2 IMPLANT
NEEDLE SPNL 22GX3.5 QUINCKE BK (NEEDLE) ×1
NS IRRIG 1000ML POUR BTL (IV SOLUTION) ×2 IMPLANT
PACK UNIVERSAL I (CUSTOM PROCEDURE TRAY) ×2 IMPLANT
PENCIL SMOKE EVACUATOR (MISCELLANEOUS) IMPLANT
SCISSORS LAP 5X35 DISP (ENDOMECHANICALS) IMPLANT
SET TUBE SMOKE EVAC HIGH FLOW (TUBING) ×2 IMPLANT
SHEARS HARMONIC 36 ACE (MISCELLANEOUS) IMPLANT
SLEEVE Z-THREAD 5X100MM (TROCAR) ×6 IMPLANT
SOLUTION ANTFG W/FOAM PAD STRL (MISCELLANEOUS) ×2 IMPLANT
STRIP CLOSURE SKIN 1/2X4 (GAUZE/BANDAGES/DRESSINGS) IMPLANT
SUT MNCRL AB 4-0 PS2 18 (SUTURE) ×2 IMPLANT
SUT SILK 0 (SUTURE) ×1
SUT SILK 0 30XBRD TIE 6 (SUTURE) ×2 IMPLANT
SUT VIC AB 2-0 SH 27 (SUTURE) ×1
SUT VIC AB 2-0 SH 27X BRD (SUTURE) ×2 IMPLANT
SUT VIC AB 3-0 SH 27 (SUTURE) ×1
SUT VIC AB 3-0 SH 27XBRD (SUTURE) IMPLANT
SUT VICRYL 0 TIES 12 18 (SUTURE) IMPLANT
SUT VICRYL 0 UR6 27IN ABS (SUTURE) IMPLANT
SYR 20ML LL LF (SYRINGE) ×2 IMPLANT
SYS KII OPTICAL ACCESS 15MM (TROCAR)
SYSTEM KII OPTICAL ACCESS 15MM (TROCAR) IMPLANT
TOWEL OR 17X26 10 PK STRL BLUE (TOWEL DISPOSABLE) ×2 IMPLANT
TOWEL OR NON WOVEN STRL DISP B (DISPOSABLE) ×2 IMPLANT
TROCAR Z THREAD OPTICAL 12X100 (TROCAR) IMPLANT
TROCAR Z-THREAD OPTICAL 5X100M (TROCAR) ×2 IMPLANT

## 2023-03-11 NOTE — Transfer of Care (Signed)
Immediate Anesthesia Transfer of Care Note  Patient: Aimee Hart  Procedure(s) Performed: LAPAROSCOPIC REMOVAL OF GASTRIC BAND AND PORT  Patient Location: PACU  Anesthesia Type:General  Level of Consciousness: drowsy  Airway & Oxygen Therapy: Patient Spontanous Breathing and Patient connected to face mask oxygen  Post-op Assessment: Report given to RN and Post -op Vital signs reviewed and stable  Post vital signs: Reviewed and stable  Last Vitals:  Vitals Value Taken Time  BP 139/85 03/11/23 1205  Temp    Pulse 79 03/11/23 1207  Resp 8 03/11/23 1207  SpO2 94 % 03/11/23 1207  Vitals shown include unfiled device data.  Last Pain:  Vitals:   03/11/23 0921  TempSrc: Oral  PainSc:       Patients Stated Pain Goal: 5 (03/11/23 0917)  Complications: No notable events documented.

## 2023-03-11 NOTE — H&P (Signed)
Chief Complaint: Bariatric Long Term Follow Up   History of Present Illness: Aimee Hart is a 57 y.o. female who is seen today for band follow-up.  She got her band placed in 2006 she lost 20 to 30 pounds from 228 down to 205. She has regained part of that weight. She had her band emptied several years ago because of reflux and food getting stuck. She had worked with Dr. Daphine Deutscher to get the band removed 2 years ago but was lost to follow-up. She is interested in getting her band is reverting to bypass.  She lost some weight on Optavia in the past.  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 She is currently on Ozempic which is given the large amount of GI side effects including worsening constipation She takes ibuprofen intermittently for headaches for joint pain  She has no limitations in activity She is not currently on a specific exercise plan  She is treated for depression and generalized anxiety She was in the ER within the last year for depressive symptoms She does not have bipolar disorder  Review of Systems: A complete review of systems was obtained from the patient. I have reviewed this information and discussed as appropriate with the patient. See HPI as well for other ROS.  Review of Systems  Constitutional: Negative.  HENT: Negative.  Eyes:  Negative.  Respiratory: Negative.  Cardiovascular: Negative.  Gastrointestinal: Negative.  Genitourinary: Negative.  Musculoskeletal: Negative.  Skin: Negative.  Neurological: Negative.  Endo/Heme/Allergies: Negative.  Psychiatric/Behavioral: Negative.    Medical History: Past Medical History:  Diagnosis Date  GERD (gastroesophageal reflux disease)  Sleep apnea   There is no problem list on file for this patient.  Past Surgical History:  Procedure Laterality Date  TONSILLECTOMY 1980  Lap Band Placement 05/22/2004  Dr. Sheron Nightingale    No Known Allergies  Current Outpatient Medications on File Prior to Visit  Medication Sig Dispense Refill  AUVELITY 45-105 mg TbIE  omeprazole (PRILOSEC) 10 MG DR capsule Take 10 mg by mouth once daily  vilazodone (VIIBRYD) 40 mg tablet  lurasidone (LATUDA) 20 mg tablet   No current facility-administered medications on file prior to visit.   Family History  Problem Relation Age of Onset  Skin cancer Mother  Thyroid disease Mother  Depression Mother  High blood pressure (Hypertension) Mother  Hyperlipidemia (Elevated cholesterol) Mother  Melanoma Mother  Obesity Father  Alcohol abuse Father  Thyroid disease Father  Coronary Artery Disease (Blocked arteries around heart) Father  High blood pressure (Hypertension) Father  Hyperlipidemia (Elevated cholesterol) Father  Diabetes Sister  Post-traumatic stress disorder Brother  Alzheimer's disease Paternal Grandmother    Social History   Tobacco Use  Smoking Status Former  Types: Cigarettes  Smokeless Tobacco Never    Social History   Socioeconomic History  Marital status: Single  Tobacco Use  Smoking status: Former  Types: Cigarettes  Smokeless tobacco: Never  Substance and Sexual Activity  Alcohol use: Never  Drug use: Never   Objective:   Vitals:  02/13/23 0916  BP: 126/84  Pulse: 92  Temp: 36.6 C (97.9 F)  SpO2: 98%  Weight: 98.6 kg (217 lb 6.4 oz)  Height:  165.1 cm (5\' 5" )  PainSc: 0-No pain   Body mass index is 36.18 kg/m.  Physical Exam Constitutional:  Appearance: Normal appearance.  HENT:  Head: Normocephalic and atraumatic.  Pulmonary:  Effort: Pulmonary effort is normal.  Abdominal:  Comments: Palpable port in right epigastrium  Musculoskeletal:  General: Normal range of motion.  Cervical back: Normal range of motion.  Neurological:  General: No focal deficit present.  Mental Status: She is alert and oriented to person, place, and time. Mental status is at baseline.  Psychiatric:  Mood and Affect: Mood normal.  Behavior: Behavior normal.  Thought Content: Thought content normal.   Labs, Imaging and Diagnostic Testing:  I reviewed notes by Wenda Low, M.D.  Assessment and Plan:   Diagnoses and all orders for this visit:  Class 2 severe obesity with body mass index (BMI) of 35 to 39.9 with serious comorbidity (CMS/HHS-HCC)  LAP-BAND surgery status  Gastroesophageal reflux disease with esophagitis without hemorrhage    She has a long history of obesity. She had a band about 20 years ago which gave moderate weight loss. We discussed options of removing the band and working on dietary control versus proceeding with removing the band and converting to gastric bypass. I talked through pathways of both. Talked through the risks of both. The Lap-Band removal performed with her under general anesthesia laparoscopically she will go home the same day. She may have a risk of weight regain. There may be some scar tissue evident. Yet with during surgery increasing risk of injury to visceral structures.  The conversion to gastric bypass would be a larger than procedure being done with general anesthesia and laparoscopically with removal of the band and then conversion to gastric bypass migrating small pouch in the upper abdomen and connecting that to the intestine regarding intestine to a lipoma. We discussed risks of that procedure  of leak, bleeding, wound infection, blood clot, pain, marginal ulcer, internal hernia, vitamin deficiency, and food intolerance or indigestion.  At this point she is most interested in moving forward with Lap-Band removal we will work towards that goal.

## 2023-03-11 NOTE — Anesthesia Postprocedure Evaluation (Signed)
Anesthesia Post Note  Patient: Aimee Hart  Procedure(s) Performed: LAPAROSCOPIC REMOVAL OF GASTRIC BAND AND PORT     Patient location during evaluation: PACU Anesthesia Type: General Level of consciousness: awake and alert Pain management: pain level controlled Vital Signs Assessment: post-procedure vital signs reviewed and stable Respiratory status: spontaneous breathing, nonlabored ventilation and respiratory function stable Cardiovascular status: blood pressure returned to baseline Postop Assessment: no apparent nausea or vomiting Anesthetic complications: no   No notable events documented.  Last Vitals:  Vitals:   03/11/23 1206 03/11/23 1215  BP: 139/85 124/83  Pulse: 79 79  Resp: 10 11  Temp: 36.7 C   SpO2: 94% 98%    Last Pain:  Vitals:   03/11/23 1206  TempSrc:   PainSc: Asleep                 Shanda Howells

## 2023-03-11 NOTE — Op Note (Signed)
Preop Diagnosis: band complication of dysphagia  Postop Diagnosis: same  Procedure performed: laparoscopic gastric band removal, subcutaneous port removal  Assitant: Gaynelle Adu  Indications:  The patient with history of gastric adjustable band insertion for weight loss presents with difficulty eating. Decision was made to proceed with band removal  Description of Operation:  Following informed consent, the patient was taken to the operating room and placed on the operating table in the supine position.  She had previously received prophylactic antibiotics.  After induction of general endotracheal anesthesia by the anesthesiologist, the patient underwent placement of sequential compression devices.  A timeout was confirmed by the surgery and anesthesia teams.  The patient was adequately padded at all pressure points and placed on a footboard to prevent slippage from the OR table during extremes of position during surgery.  She underwent a routine sterile prep and drape of her entire abdomen.    Next, A transverse incision was made under the left subcostal area and a 5mm optical viewing trocar was introduced into the peritoneal cavity. Pneumoperitoneum was applied with a high flow and low pressure. A laparoscope was inserted to confirm placement. Marcaine TAP block was then placed at the lateral abdominal wall using exparel diluted with marcaine. 4 additional incisions were placed: 1 5mm trocar to the left of the midline, 1 12 mm trocar in the right mid abdomen, 1 5mm trocar in the right subcostal area, and a Nathanson retractor was placed through a subxiphoid incision.  The band appeared to have slipped anteriorly.  The buckle of the band was identified. Cautery was used to free adhesions from over the buckle and the buckle was unclipped and cut free and removed. Next, the band was slipped from around the stomach.   The band and tubing were removed via the 12 mm trocar. The Nathanson retractor  was removed.  Next, attention was turned to the subcutaneous port. The incision over the port was enlarged and cautery was used to remove the port away from surrounding tissues. The fascia was closed with 0 vicryl. The subcutaneous spaced was closed with 3-0 vicryl in interrupted fashion. All trocars were removed after pneumoperitoneum was evacuated. All skin incisions were closed with 4-0 monocryl by subcuticular technique.  Estimated blood loss: <4ml  Specimens:  Band for gross only  Local Anesthesia: 30 ml Marcaine   Post-Op Plan:       Pain Management: PO, prn      Antibiotics: Prophylactic      Anticoagulation: Prophylactic, Starting now      Post Op Studies/Consults: Not applicable      Intended Discharge: within 48h      Intended Outpatient Follow-Up: Two Week      Intended Outpatient Studies: Not Applicable      Other: Not Applicable  De Blanch Treanna Dumler

## 2023-03-11 NOTE — Anesthesia Procedure Notes (Signed)
Procedure Name: Intubation Date/Time: 03/11/2023 10:53 AM  Performed by: Orest Dikes, CRNAPre-anesthesia Checklist: Patient identified, Emergency Drugs available, Suction available, Patient being monitored and Timeout performed Patient Re-evaluated:Patient Re-evaluated prior to induction Oxygen Delivery Method: Circle system utilized Preoxygenation: Pre-oxygenation with 100% oxygen Induction Type: IV induction Ventilation: Mask ventilation without difficulty Laryngoscope Size: Mac and 4 Grade View: Grade I Tube type: Oral Tube size: 7.0 mm Number of attempts: 1 Airway Equipment and Method: Stylet Placement Confirmation: ETT inserted through vocal cords under direct vision, breath sounds checked- equal and bilateral and positive ETCO2 Secured at: 22 cm Tube secured with: Tape Dental Injury: Teeth and Oropharynx as per pre-operative assessment

## 2023-03-11 NOTE — Anesthesia Preprocedure Evaluation (Addendum)
Anesthesia Evaluation  Patient identified by MRN, date of birth, ID band Patient awake    Reviewed: Allergy & Precautions, NPO status , Patient's Chart, lab work & pertinent test results  History of Anesthesia Complications Negative for: history of anesthetic complications  Airway Mallampati: II  TM Distance: >3 FB Neck ROM: Full    Dental no notable dental hx.    Pulmonary sleep apnea , former smoker   Pulmonary exam normal        Cardiovascular negative cardio ROS Normal cardiovascular exam     Neuro/Psych   Anxiety Depression       GI/Hepatic Neg liver ROS,GERD  Medicated,,  Endo/Other  BMI 36  Renal/GU negative Renal ROS     Musculoskeletal negative musculoskeletal ROS (+)    Abdominal   Peds  Hematology negative hematology ROS (+)   Anesthesia Other Findings Day of surgery medications reviewed with patient.  Reproductive/Obstetrics                              Anesthesia Physical Anesthesia Plan  ASA: 2  Anesthesia Plan: General   Post-op Pain Management: Tylenol PO (pre-op)*   Induction: Intravenous  PONV Risk Score and Plan: 3 and Treatment may vary due to age or medical condition, Ondansetron, Dexamethasone, Midazolam, Scopolamine patch - Pre-op and Propofol infusion  Airway Management Planned: Oral ETT  Additional Equipment: None  Intra-op Plan:   Post-operative Plan: Extubation in OR  Informed Consent: I have reviewed the patients History and Physical, chart, labs and discussed the procedure including the risks, benefits and alternatives for the proposed anesthesia with the patient or authorized representative who has indicated his/her understanding and acceptance.     Dental advisory given  Plan Discussed with: CRNA  Anesthesia Plan Comments:         Anesthesia Quick Evaluation

## 2023-03-18 ENCOUNTER — Ambulatory Visit: Payer: Commercial Managed Care - PPO | Admitting: Physician Assistant

## 2023-03-19 ENCOUNTER — Other Ambulatory Visit (HOSPITAL_BASED_OUTPATIENT_CLINIC_OR_DEPARTMENT_OTHER): Payer: Self-pay

## 2023-03-20 ENCOUNTER — Other Ambulatory Visit (HOSPITAL_BASED_OUTPATIENT_CLINIC_OR_DEPARTMENT_OTHER): Payer: Self-pay

## 2023-03-20 ENCOUNTER — Other Ambulatory Visit (HOSPITAL_COMMUNITY): Payer: Self-pay

## 2023-03-22 ENCOUNTER — Other Ambulatory Visit: Payer: Self-pay

## 2023-04-01 ENCOUNTER — Other Ambulatory Visit (HOSPITAL_COMMUNITY): Payer: Self-pay

## 2023-04-01 ENCOUNTER — Other Ambulatory Visit: Payer: Self-pay

## 2023-04-02 ENCOUNTER — Other Ambulatory Visit (HOSPITAL_COMMUNITY): Payer: Self-pay

## 2023-04-03 ENCOUNTER — Other Ambulatory Visit (HOSPITAL_BASED_OUTPATIENT_CLINIC_OR_DEPARTMENT_OTHER): Payer: Self-pay

## 2023-04-03 ENCOUNTER — Other Ambulatory Visit (HOSPITAL_COMMUNITY): Payer: Self-pay

## 2023-04-04 ENCOUNTER — Other Ambulatory Visit (HOSPITAL_BASED_OUTPATIENT_CLINIC_OR_DEPARTMENT_OTHER): Payer: Self-pay

## 2023-04-04 ENCOUNTER — Telehealth: Payer: Self-pay | Admitting: Physician Assistant

## 2023-04-04 ENCOUNTER — Other Ambulatory Visit: Payer: Self-pay

## 2023-04-04 ENCOUNTER — Other Ambulatory Visit (HOSPITAL_COMMUNITY): Payer: Self-pay

## 2023-04-04 MED ORDER — LITHIUM CARBONATE 300 MG PO TABS
300.0000 mg | ORAL_TABLET | Freq: Every evening | ORAL | 1 refills | Status: DC
  Filled 2023-04-04: qty 30, 30d supply, fill #0

## 2023-04-04 NOTE — Telephone Encounter (Signed)
Next appt is 04/29/23. Aimee Hart is requesting a refill on her Lithium 300 mg called to:  [facility]   Phone: [phone]  Fax: [phone]    She states the manufacturer was changed for this medication.

## 2023-04-04 NOTE — Telephone Encounter (Signed)
SENT 

## 2023-04-10 ENCOUNTER — Encounter: Payer: Commercial Managed Care - PPO | Admitting: Family Medicine

## 2023-04-11 ENCOUNTER — Other Ambulatory Visit (HOSPITAL_COMMUNITY): Payer: Self-pay

## 2023-04-12 ENCOUNTER — Other Ambulatory Visit (HOSPITAL_COMMUNITY): Payer: Self-pay

## 2023-04-25 ENCOUNTER — Other Ambulatory Visit (HOSPITAL_BASED_OUTPATIENT_CLINIC_OR_DEPARTMENT_OTHER): Payer: Self-pay

## 2023-04-25 ENCOUNTER — Other Ambulatory Visit: Payer: Self-pay

## 2023-04-29 ENCOUNTER — Ambulatory Visit: Payer: Commercial Managed Care - PPO | Admitting: Physician Assistant

## 2023-04-29 ENCOUNTER — Other Ambulatory Visit (HOSPITAL_BASED_OUTPATIENT_CLINIC_OR_DEPARTMENT_OTHER): Payer: Self-pay

## 2023-04-29 ENCOUNTER — Encounter: Payer: Self-pay | Admitting: Physician Assistant

## 2023-04-29 DIAGNOSIS — R5381 Other malaise: Secondary | ICD-10-CM | POA: Diagnosis not present

## 2023-04-29 DIAGNOSIS — F329 Major depressive disorder, single episode, unspecified: Secondary | ICD-10-CM

## 2023-04-29 DIAGNOSIS — R5383 Other fatigue: Secondary | ICD-10-CM

## 2023-04-29 DIAGNOSIS — F411 Generalized anxiety disorder: Secondary | ICD-10-CM | POA: Diagnosis not present

## 2023-04-29 DIAGNOSIS — G4700 Insomnia, unspecified: Secondary | ICD-10-CM | POA: Diagnosis not present

## 2023-04-29 MED ORDER — LITHIUM CARBONATE 300 MG PO TABS
300.0000 mg | ORAL_TABLET | Freq: Every evening | ORAL | 1 refills | Status: DC
  Filled 2023-04-29: qty 90, 90d supply, fill #0
  Filled 2023-08-05: qty 90, 90d supply, fill #1

## 2023-04-29 MED ORDER — AUVELITY 45-105 MG PO TBCR
1.0000 | EXTENDED_RELEASE_TABLET | Freq: Two times a day (BID) | ORAL | 1 refills | Status: DC
  Filled 2023-04-29: qty 180, 90d supply, fill #0

## 2023-04-29 NOTE — Progress Notes (Signed)
 [facility]  Patient ID: Aimee Hart,  MRN: [id number]  PCP: Severa Rock HERO, FNP  Date of Evaluation: 04/29/2023 Time spent:25 minutes  Chief Complaint:  Chief Complaint   Anxiety; Depression; Insomnia; Follow-up    HISTORY/CURRENT STATUS: HPI For routine med check.  Overall she is doing well.  Being on the lithium  for several months has helped her depression some.  We are planning on starting the Spravato but she had to use all of her sick time for an unexpected surgery back in the fall, so she will need to wait to get the Spravato done.  I am okay.  I am not as depressed as I have been in the past.  I am functional.  She is able to enjoy some things.  Energy and motivation are low a lot of the time but she is able to work without issues.  ADLs and personal hygiene are normal.  Appetite is normal and weight is stable.  She does not cry easily.  Anxiety is controlled.  She does get overwhelmed at times but no panic attacks.  Xanax  is effective.  She sleeps well, uses Ambien  occasionally.  No suicidal or homicidal thoughts.  Patient denies increased energy with decreased need for sleep, increased talkativeness, racing thoughts, impulsivity or risky behaviors, increased spending, increased libido, grandiosity, increased irritability or anger, paranoia, or hallucinations.  Denies dizziness, syncope, seizures, numbness, tingling, tremor, tics, unsteady gait, slurred speech, confusion. Denies muscle or joint pain, stiffness, or dystonia. Denies unexplained weight loss, frequent infections, or sores that heal slowly.  No polyphagia, polydipsia, or polyuria. Denies visual changes or paresthesias.   Individual Medical History/ Review of Systems: Changes? :Yes   had lap band removed in Oct.  Past medications for mental health diagnoses include: Zoloft, Prozac, Paxil, Abilify, Effexor, Celexa, Cymbalta, Lamictal, Ambien , melatonin, Wellbutrin  SR, Trintellix , Viibryd , Cerefolin NAC  (not sure if effective at all plus was expensive) Auvelity   Allergies: Patient has no known allergies.  Current Medications:  Current Outpatient Medications:    ALPRAZolam  (XANAX ) 0.5 MG tablet, Take 1 tablet (0.5 mg total) by mouth 2 (two) times daily as needed for anxiety., Disp: 30 tablet, Rfl: 5   estradiol  (VIVELLE -DOT) 0.1 MG/24HR patch, Apply 1 patch to the skin twice a week., Disp: 24 patch, Rfl: 1   lurasidone  (LATUDA ) 40 MG TABS tablet, Take 1 tablet (40 mg total) by mouth daily with supper., Disp: 90 tablet, Rfl: 1   metroNIDAZOLE  (METROCREAM ) 0.75 % cream, Apply as directed to affected area twice a day; Use of SPF is recommended. (Patient taking differently: Apply 1 Application topically 2 (two) times daily as needed (rosacea).), Disp: 45 g, Rfl: 1   NON FORMULARY, Pt uses a c-pap nightly, Disp: , Rfl:    progesterone  (PROMETRIUM ) 100 MG capsule, Take 1 capsule every day by oral route at bedtime., Disp: 30 capsule, Rfl: 11   progesterone  (PROMETRIUM ) 100 MG capsule, Take 1 capsule (100 mg total) by mouth at bedtime., Disp: 90 capsule, Rfl: 2   Vilazodone  HCl (VIIBRYD ) 40 MG TABS, Take 1 tablet by mouth daily., Disp: 90 tablet, Rfl: 3   Vitamin D , Ergocalciferol , (DRISDOL ) 1.25 MG (50000 UNIT) CAPS capsule, Take 1 capsule (50,000 Units total) by mouth every 7 (seven) days., Disp: 12 capsule, Rfl: 3   zolpidem  (AMBIEN ) 10 MG tablet, Take 1/2 - 1 tablet by mouth at bedtime as needed for sleep., Disp: 30 tablet, Rfl: 4   Dextromethorphan -buPROPion  ER (AUVELITY ) 45-105 MG TBCR, Take 1  tablet by mouth 2 (two) times daily., Disp: 180 tablet, Rfl: 1   docusate sodium (COLACE) 100 MG capsule, Take 100 mg by mouth daily as needed for mild constipation., Disp: , Rfl:    lithium  300 MG tablet, Take 1 tablet (300 mg total) by mouth at bedtime., Disp: 90 tablet, Rfl: 1   omeprazole (PRILOSEC) 10 MG capsule, Take 10 mg by mouth daily., Disp: , Rfl:    ondansetron  (ZOFRAN -ODT) 4 MG disintegrating  tablet, Take 1 tablet (4 mg total) by mouth every 6 (six) hours as needed for nausea or vomiting., Disp: 20 tablet, Rfl: 0   oxyCODONE  (OXY IR/ROXICODONE ) 5 MG immediate release tablet, Take 1 tablet (5 mg total) by mouth every 6 (six) hours as needed for breakthrough pain or severe pain (pain score 7-10)., Disp: 10 tablet, Rfl: 0   triamcinolone  cream (KENALOG ) 0.1 %, Apply 1 Application topically at bedtime. Apply to area on right lower leg as needed., Disp: 80 g, Rfl: 1 Medication Side Effects: none  Family Medical/ Social History: Changes?  No   MENTAL HEALTH EXAM:  There were no vitals taken for this visit.There is no height or weight on file to calculate BMI.  General Appearance: Casual, Neat and Well Groomed  Eye Contact:  Good  Speech:  Clear and Coherent and Normal Rate  Volume:  Normal  Mood:  Euthymic  Affect:  Congruent  Thought Process:  Goal Directed and Descriptions of Associations: Circumstantial  Orientation:  Full (Time, Place, and Person)  Thought Content: Logical   Suicidal Thoughts:  No  Homicidal Thoughts:  No  Memory:  WNL  Judgement:  Good  Insight:  Good  Psychomotor Activity:  Normal  Concentration:  Concentration: Good and Attention Span: Good  Recall:  Good  Fund of Knowledge: Good  Language: Good  Assets:  Communication Skills Desire for Improvement Financial Resources/Insurance Housing Transportation Vocational/Educational  ADL's:  Intact  Cognition: WNL  Prognosis:  Good   GeneSight results on chart, scanned under labs.  Reviewed labs from 03/06/2023.  Creatinine borderline but this is normal for her.  ECT-MADRS    Flowsheet Row Office Visit from 02/04/2023 [facility] Office Visit from 09/22/2021 in [facility] Office Visit from 08/22/2021 in [facility]  MADRS Total Score 40 4 36      GAD-7    Flowsheet Row Office Visit from 05/05/2021 in [facility] Office Visit from 12/29/2020 in [facility] Office Visit from 05/14/2018 in [facility]  Total GAD-7 Score 1 1 0      PHQ2-9    Flowsheet Row Office Visit from 05/05/2021 in [facility] Office Visit from 12/29/2020 in [facility] Office Visit from 12/25/2018 in [facility] Office Visit from 05/14/2018 in [facility] Office Visit from 05/08/2018 in [facility]  PHQ-2 Total Score 2 1 0 0 0  PHQ-9 Total Score 4 3 -- 0 --      Flowsheet Row Admission (Discharged) from 03/11/2023 in [place] [facility] ED from 05/30/2021 in [facility] Emergency Department at [facility]  C-SSRS RISK CATEGORY No Risk No Risk      DIAGNOSES:    ICD-10-CM   1. Treatment-resistant depression  F32.9     2. Generalized anxiety disorder  F41.1     3. Insomnia, unspecified type  G47.00     4. Malaise and fatigue  R53.81    R53.83       Receiving Psychotherapy: Yes     RECOMMENDATIONS:  PDMP was reviewed.  Xanax  filled 1-20 25.  Ambien  filled 07/30/2022.   I provided 25 minutes of face to face time during this encounter, including time spent before and after the visit in records review, medical decision making, counseling pertinent to today's visit, and charting.   We agreed to leave all meds the same for now.  Hopefully she can start the Spravato soon.  I believe she will benefit from it.  For now no changes.  We discussed the lithium  and need for labs.  Her creatinine is borderline elevated, I do not even consider it abnormal.  It is stable though as she had similar numbers of 1.02 or thereabouts for a year or more.  Since she is on such a low dose of lithium , a level does not need to be drawn.  A pharmacist told her that it  did and I reassured her that unless the dose is increased then it does not have to be monitored.  Continue Xanax  0.5 mg, 1 p.o. twice daily as needed. Continue Auvelity  45/105 mg, 1 p.o. twice daily.   Continue lithium  300 mg, 1 p.o. nightly. Continue Latuda  40 mg w/ supper. Continue Viibryd  40 mg p.o. daily.   Continue Ambien  10 mg, 1/2-1 nightly as needed sleep. Continue vitamins as per med list.  Continue counseling. Return in 3 months.  Verneita Cooks, PA-C

## 2023-05-02 ENCOUNTER — Other Ambulatory Visit (HOSPITAL_BASED_OUTPATIENT_CLINIC_OR_DEPARTMENT_OTHER): Payer: Self-pay

## 2023-05-02 ENCOUNTER — Other Ambulatory Visit: Payer: Self-pay

## 2023-05-16 NOTE — Patient Instructions (Addendum)
Aimee Hart

## 2023-05-17 ENCOUNTER — Other Ambulatory Visit (HOSPITAL_BASED_OUTPATIENT_CLINIC_OR_DEPARTMENT_OTHER): Payer: Self-pay

## 2023-05-20 ENCOUNTER — Other Ambulatory Visit (HOSPITAL_BASED_OUTPATIENT_CLINIC_OR_DEPARTMENT_OTHER): Payer: Self-pay

## 2023-05-20 MED ORDER — ESTRADIOL 0.1 MG/24HR TD PTTW
1.0000 | MEDICATED_PATCH | TRANSDERMAL | 0 refills | Status: DC
  Filled 2023-05-20: qty 24, 84d supply, fill #0

## 2023-05-22 ENCOUNTER — Encounter: Payer: Self-pay | Admitting: Family Medicine

## 2023-05-22 ENCOUNTER — Other Ambulatory Visit (HOSPITAL_BASED_OUTPATIENT_CLINIC_OR_DEPARTMENT_OTHER): Payer: Self-pay

## 2023-05-22 ENCOUNTER — Ambulatory Visit (INDEPENDENT_AMBULATORY_CARE_PROVIDER_SITE_OTHER): Payer: Commercial Managed Care - PPO | Admitting: Family Medicine

## 2023-05-22 VITALS — BP 104/71 | HR 70 | Temp 97.4°F | Ht 65.0 in | Wt 229.0 lb

## 2023-05-22 DIAGNOSIS — E7800 Pure hypercholesterolemia, unspecified: Secondary | ICD-10-CM

## 2023-05-22 DIAGNOSIS — E66812 Obesity, class 2: Secondary | ICD-10-CM | POA: Diagnosis not present

## 2023-05-22 DIAGNOSIS — Z0001 Encounter for general adult medical examination with abnormal findings: Secondary | ICD-10-CM

## 2023-05-22 DIAGNOSIS — Z0000 Encounter for general adult medical examination without abnormal findings: Secondary | ICD-10-CM

## 2023-05-22 DIAGNOSIS — R7303 Prediabetes: Secondary | ICD-10-CM

## 2023-05-22 DIAGNOSIS — R946 Abnormal results of thyroid function studies: Secondary | ICD-10-CM | POA: Diagnosis not present

## 2023-05-22 DIAGNOSIS — M7041 Prepatellar bursitis, right knee: Secondary | ICD-10-CM

## 2023-05-22 DIAGNOSIS — E559 Vitamin D deficiency, unspecified: Secondary | ICD-10-CM

## 2023-05-22 DIAGNOSIS — Z6836 Body mass index (BMI) 36.0-36.9, adult: Secondary | ICD-10-CM | POA: Diagnosis not present

## 2023-05-22 DIAGNOSIS — F331 Major depressive disorder, recurrent, moderate: Secondary | ICD-10-CM | POA: Diagnosis not present

## 2023-05-22 LAB — LIPID PANEL

## 2023-05-22 LAB — BAYER DCA HB A1C WAIVED: HB A1C (BAYER DCA - WAIVED): 4.8 % (ref 4.8–5.6)

## 2023-05-22 MED ORDER — PREDNISONE 20 MG PO TABS
40.0000 mg | ORAL_TABLET | Freq: Every day | ORAL | 0 refills | Status: AC
  Filled 2023-05-22: qty 10, 5d supply, fill #0

## 2023-05-22 NOTE — Progress Notes (Signed)
Complete physical exam  Patient: Aimee Hart   DOB: October 09, 1965   58 y.o. Female  MRN: [id number]  Subjective:    Chief Complaint  Patient presents with   Annual Exam   Bursitis    Previously dx with bursitis. Swelling in right knee flared up. No pain. Past 4 days.     Aimee Hart is a 58 y.o. female who presents today for a complete physical exam. She reports consuming a general diet. Gym/ health club routine includes treadmill. She generally feels well. She reports sleeping well. She does have additional problems to discuss today.    The patient presents for an annual physical exam.  She is experiencing significant difficulties with menopause, including emotional instability that led to a breakdown at work and an emergency room visit. She describes persistent crying and has been prescribed lithium and Latuda by her psychiatrist, Melony Overly, a nurse practitioner at [facility]. Despite these medications, she feels they are not effective. She is also on hormone therapy but remains distressed by her symptoms. She has gained approximately 13 pounds over the past three months. Hair loss is noted, primarily during showering and brushing.  Her right knee is swollen, a recurring issue from last year when she was diagnosed with bursitis at [facility]. She does not recall the specific treatment but has been managing with ice and ibuprofen. The knee feels tight but not painful, and she denies any recent injuries. An x-ray was performed during her previous visit to [facility].  She continues to manage her sleep apnea with a CPAP machine without issues. She is on a weekly vitamin D regimen and continues to take progesterone, although it is listed twice in her records. She has recently joined a gym and exercises three to four days a week, primarily walking on the treadmill. She acknowledges difficulty in increasing her water intake. No changes in bowel or bladder habits, abdominal pain,  chest pain, shortness of breath, or leg swelling. She visits the eye doctor annually and the dentist biannually, with no recent dental issues.       Most recent fall risk assessment:    05/05/2021    3:37 PM  Fall Risk   Falls in the past year? 0     Most recent depression screenings:    05/22/2023    2:42 PM 05/05/2021    3:37 PM 12/29/2020    3:11 PM 12/25/2018   11:55 AM 05/14/2018   10:33 AM  Depression screen PHQ 2/9  Decreased Interest 2 1 0 0 0  Down, Depressed, Hopeless 2 1 1  0 0  PHQ - 2 Score 4 2 1  0 0  Altered sleeping 1 0 1  0  Tired, decreased energy 1 1 1   0  Change in appetite 3 0 0  0  Feeling bad or failure about yourself  0 1 0  0  Trouble concentrating 0 0 0  0  Moving slowly or fidgety/restless 0 0 0  0  Suicidal thoughts 0 0 0  0  PHQ-9 Score 9 4 3   0  Difficult doing work/chores Somewhat difficult Somewhat difficult Not difficult at all        05/22/2023    2:42 PM 05/05/2021    3:37 PM 12/29/2020    3:12 PM 05/14/2018   10:33 AM  GAD 7 : Generalized Anxiety Score  Nervous, Anxious, on Edge 1 0 0 0  Control/stop worrying 1 0 0 0  Worry too much -  different things 1 0 0 0  Trouble relaxing 0 0 0 0  Restless 0 0 0 0  Easily annoyed or irritable 3 1 1  0  Afraid - awful might happen 0 0 0 0  Total GAD 7 Score 6 1 1  0  Anxiety Difficulty Not difficult at all Not difficult at all Not difficult at all        Vision:Within last year and Dental: No current dental problems and Receives regular dental care  Patient Active Problem List   Diagnosis Date Noted   Major depressive disorder, recurrent episode, moderate (HCC) 05/22/2023   MDD (major depressive disorder) 01/31/2023   Other insomnia 10/03/2022   Former smoker 10/03/2022   SOB (shortness of breath) on exertion 09/19/2022   Other fatigue 09/19/2022   LAP-BAND surgery status 09/03/2022   OSA on CPAP 05/05/2021   Obesity (BMI 30-39.9) 12/25/2018   GERD without esophagitis 05/14/2018   Recurrent  major depression (HCC) 01/27/2018   Past Medical History:  Diagnosis Date   Cancer (HCC)    melanoma, back   Depression    GERD (gastroesophageal reflux disease)    Hypercholesteremia    Knee pain    Lower back pain    OSA (obstructive sleep apnea)    Sleep apnea    Past Surgical History:  Procedure Laterality Date   COLONOSCOPY     LAPAROSCOPIC ROUX-EN-Y GASTRIC BYPASS WITH UPPER ENDOSCOPY AND REMOVAL OF LAP BAND  2008   OTHER SURGICAL HISTORY     Lap band removal 03/11/23   TONSILLECTOMY  1980   Social History   Tobacco Use   Smoking status: Former    Current packs/day: 0.00    Average packs/day: 1 pack/day for 10.0 years (10.0 ttl pk-yrs)    Types: Cigarettes    Start date: 02/06/1986    Quit date: 02/07/1996    Years since quitting: 27.3    Passive exposure: Past   Smokeless tobacco: Never  Vaping Use   Vaping status: Never Used  Substance Use Topics   Alcohol use: Yes    Comment: 1/week   Drug use: Never   Social History   Socioeconomic History   Marital status: Divorced    Spouse name: Not on file   Number of children: 2   Years of education: college   Highest education level: Not on file  Occupational History    Comment: RN  Tobacco Use   Smoking status: Former    Current packs/day: 0.00    Average packs/day: 1 pack/day for 10.0 years (10.0 ttl pk-yrs)    Types: Cigarettes    Start date: 02/06/1986    Quit date: 02/07/1996    Years since quitting: 27.3    Passive exposure: Past   Smokeless tobacco: Never  Vaping Use   Vaping status: Never Used  Substance and Sexual Activity   Alcohol use: Yes    Comment: 1/week   Drug use: Never   Sexual activity: Yes  Other Topics Concern   Not on file  Social History Narrative   Lives with son   Caffeine 16 oz daily   Social Drivers of Health   Financial Resource Strain: Not on file  Food Insecurity: No Food Insecurity (05/22/2023)   Hunger Vital Sign    Worried About Running Out of Food in the  Last Year: Never true    Ran Out of Food in the Last Year: Never true  Transportation Needs: No Transportation Needs (05/22/2023)   PRAPARE - Transportation  Lack of Transportation (Medical): No    Lack of Transportation (Non-Medical): No  Physical Activity: Not on file  Stress: No Stress Concern Present (05/22/2023)   [employer] of Occupational Health - Occupational Stress Questionnaire    Feeling of Stress : Not at all  Social Connections: Moderately Isolated (05/22/2023)   Social Connection and Isolation Panel [NHANES]    Frequency of Communication with Friends and Family: More than three times a week    Frequency of Social Gatherings with Friends and Family: Once a week    Attends Religious Services: Never    Database administrator or Organizations: Yes    Attends Banker Meetings: 1 to 4 times per year    Marital Status: Divorced  Catering manager Violence: Not At Risk (05/22/2023)   Humiliation, Afraid, Rape, and Kick questionnaire    Fear of Current or Ex-Partner: No    Emotionally Abused: No    Physically Abused: No    Sexually Abused: No   Family Status  Relation Name Status   Mother  Alive   Father  Alive   Sister  Alive   Sister  Alive   Brother  Alive   MGM  Deceased   MGF  Deceased   PGM  Deceased   PGF  Deceased   Daughter  (Not Specified)  No partnership data on file   Family History  Problem Relation Age of Onset   Cancer Mother    Hyperlipidemia Mother    Melanoma Mother    Hypertension Mother    Depression Mother    Thyroid disease Mother    Hyperlipidemia Father    Hypertension Father    Heart disease Father    Thyroid disease Father    Alcoholism Father    Obesity Father    Cancer Sister        bladder   Post-traumatic stress disorder Brother    Cancer Maternal Grandmother        lung   Emphysema Maternal Grandfather    Alzheimer's disease Paternal Grandmother    Heart disease Paternal Grandfather    Kidney disease  Daughter    No Known Allergies    Patient Care Team: Graves Nipp, Doralee Albino, FNP as PCP - General (Family Medicine) Maisie Fus, MD as PCP - Cardiology (Cardiology) [facility]   Outpatient Medications Prior to Visit  Medication Sig   ALPRAZolam (XANAX) 0.5 MG tablet Take 1 tablet (0.5 mg total) by mouth 2 (two) times daily as needed for anxiety.   Dextromethorphan-buPROPion ER (AUVELITY) 45-105 MG TBCR Take 1 tablet by mouth 2 (two) times daily.   estradiol (VIVELLE-DOT) 0.1 MG/24HR patch Place 1 patch (0.1 mg total) onto the skin 2 (two) times a week.   lithium 300 MG tablet Take 1 tablet (300 mg total) by mouth at bedtime.   lurasidone (LATUDA) 40 MG TABS tablet Take 1 tablet (40 mg total) by mouth daily with supper.   metroNIDAZOLE (METROCREAM) 0.75 % cream Apply as directed to affected area twice a day; Use of SPF is recommended. (Patient taking differently: Apply 1 Application topically 2 (two) times daily as needed (rosacea).)   NON FORMULARY Pt uses a c-pap nightly   progesterone (PROMETRIUM) 100 MG capsule Take 1 capsule every day by oral route at bedtime.   Vilazodone HCl (VIIBRYD) 40 MG TABS Take 1 tablet by mouth daily.   Vitamin D, Ergocalciferol, (DRISDOL) 1.25 MG (50000 UNIT) CAPS capsule Take 1 capsule (50,000  Units total) by mouth every 7 (seven) days.   zolpidem (AMBIEN) 10 MG tablet Take 1/2 - 1 tablet by mouth at bedtime as needed for sleep.   [DISCONTINUED] docusate sodium (COLACE) 100 MG capsule Take 100 mg by mouth daily as needed for mild constipation.   [DISCONTINUED] omeprazole (PRILOSEC) 10 MG capsule Take 10 mg by mouth daily.   [DISCONTINUED] ondansetron (ZOFRAN-ODT) 4 MG disintegrating tablet Take 1 tablet (4 mg total) by mouth every 6 (six) hours as needed for nausea or vomiting.   [DISCONTINUED] oxyCODONE (OXY IR/ROXICODONE) 5 MG immediate release tablet Take 1 tablet (5 mg total) by mouth every 6 (six) hours as needed for breakthrough pain  or severe pain (pain score 7-10).   [DISCONTINUED] progesterone (PROMETRIUM) 100 MG capsule Take 1 capsule (100 mg total) by mouth at bedtime.   [DISCONTINUED] triamcinolone cream (KENALOG) 0.1 % Apply 1 Application topically at bedtime. Apply to area on right lower leg as needed.   No facility-administered medications prior to visit.    ROS per HPI      Objective:     BP 104/71   Pulse 70   Temp (!) 97.4 F (36.3 C)   Ht 5\' 5"  (1.651 m)   Wt 229 lb (103.9 kg)   SpO2 95%   BMI 38.11 kg/m  BP Readings from Last 3 Encounters:  05/22/23 104/71  03/11/23 122/77  03/06/23 127/75   Wt Readings from Last 3 Encounters:  05/22/23 229 lb (103.9 kg)  03/11/23 215 lb (97.5 kg)  03/06/23 215 lb (97.5 kg)   SpO2 Readings from Last 3 Encounters:  05/22/23 95%  03/11/23 98%  10/30/22 97%      Physical Exam Vitals and nursing note reviewed.  Constitutional:      General: She is not in acute distress.    Appearance: Normal appearance. She is well-developed and well-groomed. She is obese. She is not ill-appearing, toxic-appearing or diaphoretic.  HENT:     Head: Normocephalic and atraumatic.     Jaw: There is normal jaw occlusion.     Right Ear: Hearing, tympanic membrane, ear canal and external ear normal.     Left Ear: Hearing, tympanic membrane, ear canal and external ear normal.     Nose: Nose normal.     Mouth/Throat:     Lips: Pink.     Mouth: Mucous membranes are moist.     Pharynx: Oropharynx is clear. Uvula midline.  Eyes:     General: Lids are normal.     Extraocular Movements: Extraocular movements intact.     Conjunctiva/sclera: Conjunctivae normal.     Pupils: Pupils are equal, round, and reactive to light.  Neck:     Thyroid: No thyroid mass, thyromegaly or thyroid tenderness.     Vascular: No carotid bruit or JVD.     Trachea: Trachea and phonation normal.  Cardiovascular:     Rate and Rhythm: Normal rate and regular rhythm.     Chest Wall: PMI is not  displaced.     Pulses: Normal pulses.     Heart sounds: Normal heart sounds. No murmur heard.    No friction rub. No gallop.  Pulmonary:     Effort: Pulmonary effort is normal. No respiratory distress.     Breath sounds: Normal breath sounds. No wheezing.  Abdominal:     General: Bowel sounds are normal. There is no distension or abdominal bruit.     Palpations: Abdomen is soft. There is no hepatomegaly or splenomegaly.  Tenderness: There is no abdominal tenderness. There is no right CVA tenderness or left CVA tenderness.     Hernia: No hernia is present.  Musculoskeletal:        General: Normal range of motion.     Cervical back: Normal range of motion and neck supple.     Right knee: Swelling present. No deformity, effusion, erythema, ecchymosis, lacerations, bony tenderness or crepitus. Normal range of motion. No tenderness. No LCL laxity, MCL laxity, ACL laxity or PCL laxity. Normal alignment, normal meniscus and normal patellar mobility. Normal pulse.     Right lower leg: No edema.     Left lower leg: No edema.  Lymphadenopathy:     Cervical: No cervical adenopathy.  Skin:    General: Skin is warm and dry.     Capillary Refill: Capillary refill takes less than 2 seconds.     Coloration: Skin is not cyanotic, jaundiced or pale.     Findings: No rash.  Neurological:     General: No focal deficit present.     Mental Status: She is alert and oriented to person, place, and time.     Sensory: Sensation is intact.     Motor: Motor function is intact.     Coordination: Coordination is intact.     Gait: Gait is intact.     Deep Tendon Reflexes: Reflexes are normal and symmetric.  Psychiatric:        Attention and Perception: Attention and perception normal.        Mood and Affect: Mood and affect normal.        Speech: Speech normal.        Behavior: Behavior normal. Behavior is cooperative.        Thought Content: Thought content normal.        Cognition and Memory: Cognition  and memory normal.        Judgment: Judgment normal.       Last CBC Lab Results  Component Value Date   WBC 7.4 03/06/2023   HGB 13.4 03/06/2023   HCT 43.0 03/06/2023   MCV 85.7 03/06/2023   MCH 26.7 03/06/2023   RDW 14.4 03/06/2023   PLT 304 03/06/2023   Last metabolic panel Lab Results  Component Value Date   GLUCOSE 105 (H) 03/06/2023   NA 137 03/06/2023   K 3.7 03/06/2023   CL 106 03/06/2023   CO2 23 03/06/2023   BUN 19 03/06/2023   CREATININE 1.03 (H) 03/06/2023   GFRNONAA >60 03/06/2023   CALCIUM 8.7 (L) 03/06/2023   PROT 7.0 01/31/2023   ALBUMIN 4.1 01/31/2023   LABGLOB 2.2 07/27/2022   AGRATIO 1.8 07/27/2022   BILITOT 0.5 01/31/2023   ALKPHOS 56 01/31/2023   AST 19 01/31/2023   ALT 14 01/31/2023   ANIONGAP 8 03/06/2023   Last lipids Lab Results  Component Value Date   CHOL 195 07/27/2022   HDL 47 07/27/2022   LDLCALC 128 (H) 07/27/2022   TRIG 111 07/27/2022   CHOLHDL 4.1 07/27/2022   Last hemoglobin A1c Lab Results  Component Value Date   HGBA1C 5.6 07/27/2022   Last thyroid functions Lab Results  Component Value Date   TSH 1.510 07/27/2022   T4TOTAL 8.3 05/05/2021   Last vitamin D Lab Results  Component Value Date   VD25OH 34.0 10/12/2022   Last vitamin B12 and Folate Lab Results  Component Value Date   VITAMINB12 1,981 (H) 07/27/2022   FOLATE 11.2 07/27/2022  Assessment & Plan:    Routine Health Maintenance and Physical Exam  Immunization History  Administered Date(s) Administered   Influenza-Unspecified 01/21/2018, 02/18/2023   Tdap 09/21/2017    Health Maintenance  Topic Date Due   Hepatitis C Screening  Never done   COVID-19 Vaccine (1) 06/07/2023 (Originally 12/05/1970)   Zoster Vaccines- Shingrix (1 of 2) 08/20/2023 (Originally 12/06/1983)   MAMMOGRAM  05/21/2024 (Originally 03/03/2023)   Colonoscopy  12/21/2024   Cervical Cancer Screening (HPV/Pap Cotest)  03/03/2027   DTaP/Tdap/Td (2 - Td or Tdap)  09/22/2027   INFLUENZA VACCINE  Completed   HIV Screening  Completed   HPV VACCINES  Aged Out    Discussed health benefits of physical activity, and encouraged her to engage in regular exercise appropriate for her age and condition.  Problem List Items Addressed This Visit       Other   Major depressive disorder, recurrent episode, moderate (HCC)   Relevant Orders   Lipid panel   Thyroid Panel With TSH   RESOLVED: Pure hypercholesterolemia   Relevant Orders   CMP14+EGFR   Lipid panel   RESOLVED: Prediabetes   Relevant Orders   CBC with Differential/Platelet   CMP14+EGFR   Lipid panel   Bayer DCA Hb A1c Waived   Thyroid Panel With TSH   RESOLVED: Class 2 severe obesity with serious comorbidity and body mass index (BMI) of 36.0 to 36.9 in adult [facility])   Relevant Orders   CBC with Differential/Platelet   CMP14+EGFR   Lipid panel   VITAMIN D 25 Hydroxy (Vit-D Deficiency, Fractures)   Bayer DCA Hb A1c Waived   Thyroid Panel With TSH   Other Visit Diagnoses       Annual physical exam    -  Primary   Relevant Orders   CBC with Differential/Platelet   CMP14+EGFR   Lipid panel   VITAMIN D 25 Hydroxy (Vit-D Deficiency, Fractures)   Bayer DCA Hb A1c Waived   Thyroid Panel With TSH     Vitamin D deficiency       Relevant Orders   CMP14+EGFR   VITAMIN D 25 Hydroxy (Vit-D Deficiency, Fractures)     Prepatellar bursitis of right knee       Relevant Medications   predniSONE (DELTASONE) 20 MG tablet     Assessment and Plan    Menopause-related symptoms Severe menopause-related symptoms including emotional instability, crying, and 13-pound weight gain in three months. Currently on hormone therapy and psychiatric medications (lithium, Latuda) prescribed by psychiatrist Melony Overly, NP, with no significant relief. Considering ketamine infusions but faces logistical challenges due to transportation issues. Ketamine infusions have shown benefits for some patients with similar  symptoms. - Continue current hormone therapy and psychiatric medications - Discuss potential for ketamine infusions with psychiatrist - Follow up with psychiatrist in 2.5 months  Bursitis of the right knee Swelling and tightness in the right knee, previously diagnosed with bursitis by [facility]. No recent injuries. Managing with ice and ibuprofen. Advised to be cautious with steroids due to potential impact on mental health. - Prescribe prednisone, 2 tablets every morning with breakfast for 5 days starting tomorrow - Monitor symptoms and follow up if no improvement  General Health Maintenance Up to date on general health maintenance. Last Pap smear and mammogram were two years ago, with an upcoming appointment in March. Colonoscopy not due until next year. Hepatitis C and HIV screenings are up to date. Compliant with CPAP for sleep apnea and vitamin D repletion. Engages  in regular exercise at the gym. - Ensure OB GYN faxes Pap smear and mammogram results - Check A1c and other lab results - Continue CPAP use and vitamin D repletion - Encourage continued exercise and increased water intake  Follow-up - Schedule follow-up appointment in six months - Review lab results and contact if any abnormalities are found.       Return in about 1 year (around 05/21/2024), or if symptoms worsen or fail to improve, for Annual Physical.     Kari Baars, FNP

## 2023-05-23 LAB — CMP14+EGFR
ALT: 8 IU/L (ref 0–32)
AST: 13 IU/L (ref 0–40)
Albumin: 4 g/dL (ref 3.8–4.9)
Alkaline Phosphatase: 77 IU/L (ref 44–121)
BUN/Creatinine Ratio: 13 (ref 9–23)
BUN: 14 mg/dL (ref 6–24)
Bilirubin Total: 0.2 mg/dL (ref 0.0–1.2)
CO2: 22 mmol/L (ref 20–29)
Calcium: 8.7 mg/dL (ref 8.7–10.2)
Chloride: 105 mmol/L (ref 96–106)
Creatinine, Ser: 1.09 mg/dL — ABNORMAL HIGH (ref 0.57–1.00)
Globulin, Total: 2.4 g/dL (ref 1.5–4.5)
Glucose: 101 mg/dL — ABNORMAL HIGH (ref 70–99)
Potassium: 4.4 mmol/L (ref 3.5–5.2)
Sodium: 141 mmol/L (ref 134–144)
Total Protein: 6.4 g/dL (ref 6.0–8.5)
eGFR: 59 mL/min/{1.73_m2} — ABNORMAL LOW (ref >59)

## 2023-05-23 LAB — CBC WITH DIFFERENTIAL/PLATELET
Basophils Absolute: 0.1 10*3/uL (ref 0.0–0.2)
Basos: 1 %
EOS (ABSOLUTE): 0.3 10*3/uL (ref 0.0–0.4)
Eos: 4 %
Hematocrit: 41.5 % (ref 34.0–46.6)
Hemoglobin: 13.9 g/dL (ref 11.1–15.9)
Immature Grans (Abs): 0 10*3/uL (ref 0.0–0.1)
Immature Granulocytes: 1 %
Lymphocytes Absolute: 1.8 10*3/uL (ref 0.7–3.1)
Lymphs: 27 %
MCH: 28.7 pg (ref 26.6–33.0)
MCHC: 33.5 g/dL (ref 31.5–35.7)
MCV: 86 fL (ref 79–97)
Monocytes Absolute: 0.6 10*3/uL (ref 0.1–0.9)
Monocytes: 9 %
Neutrophils Absolute: 3.9 10*3/uL (ref 1.4–7.0)
Neutrophils: 58 %
Platelets: 292 10*3/uL (ref 150–450)
RBC: 4.85 x10E6/uL (ref 3.77–5.28)
RDW: 14 % (ref 11.7–15.4)
WBC: 6.5 10*3/uL (ref 3.4–10.8)

## 2023-05-23 LAB — LIPID PANEL
Cholesterol, Total: 210 mg/dL — ABNORMAL HIGH (ref 100–199)
HDL: 40 mg/dL (ref >39)
LDL CALC COMMENT:: 5.3 ratio — ABNORMAL HIGH (ref 0.0–4.4)
LDL Chol Calc (NIH): 125 mg/dL — ABNORMAL HIGH (ref 0–99)
Triglycerides: 257 mg/dL — ABNORMAL HIGH (ref 0–149)
VLDL Cholesterol Cal: 45 mg/dL — ABNORMAL HIGH (ref 5–40)

## 2023-05-23 LAB — VITAMIN D 25 HYDROXY (VIT D DEFICIENCY, FRACTURES): Vit D, 25-Hydroxy: 24.6 ng/mL — ABNORMAL LOW (ref 30.0–100.0)

## 2023-05-24 LAB — THYROID PANEL WITH TSH
Free Thyroxine Index: 1.7 (ref 1.2–4.9)
T3 Uptake Ratio: 24 % (ref 24–39)
T4, Total: 7.1 ug/dL (ref 4.5–12.0)
TSH: 2.41 u[IU]/mL (ref 0.450–4.500)

## 2023-05-24 LAB — SPECIMEN STATUS REPORT

## 2023-06-08 DIAGNOSIS — G4733 Obstructive sleep apnea (adult) (pediatric): Secondary | ICD-10-CM | POA: Diagnosis not present

## 2023-07-03 ENCOUNTER — Ambulatory Visit (INDEPENDENT_AMBULATORY_CARE_PROVIDER_SITE_OTHER)

## 2023-07-03 ENCOUNTER — Ambulatory Visit: Admitting: Family Medicine

## 2023-07-03 ENCOUNTER — Encounter: Payer: Self-pay | Admitting: Family Medicine

## 2023-07-03 VITALS — BP 114/71 | HR 76 | Temp 97.3°F | Ht 65.0 in | Wt 229.2 lb

## 2023-07-03 DIAGNOSIS — M7041 Prepatellar bursitis, right knee: Secondary | ICD-10-CM

## 2023-07-03 DIAGNOSIS — M25561 Pain in right knee: Secondary | ICD-10-CM | POA: Diagnosis not present

## 2023-07-03 DIAGNOSIS — M1711 Unilateral primary osteoarthritis, right knee: Secondary | ICD-10-CM | POA: Diagnosis not present

## 2023-07-03 DIAGNOSIS — G8929 Other chronic pain: Secondary | ICD-10-CM

## 2023-07-03 MED ORDER — METHYLPREDNISOLONE ACETATE 40 MG/ML IJ SUSP
40.0000 mg | Freq: Once | INTRAMUSCULAR | Status: AC
  Administered 2023-07-03: 60 mg via INTRAMUSCULAR

## 2023-07-03 NOTE — Progress Notes (Signed)
 Subjective:  Patient ID: Aimee Hart, female    DOB: Apr 10, 1966, 58 y.o.   MRN: [id number]  Patient Care Team: Sonny Masters, FNP as PCP - General (Family Medicine) Maisie Fus, MD as PCP - Cardiology (Cardiology) [facility], [facility]   Chief Complaint:  Knee Pain (Right knee pain that has been going on since January.  States it got better for two weeks after steroids. )   HPI: Aimee Hart is a 58 y.o. female presenting on 07/03/2023 for Knee Pain (Right knee pain that has been going on since January.  States it got better for two weeks after steroids. )   Discussed the use of AI scribe software for clinical note transcription with the patient, who gave verbal consent to proceed.  History of Present Illness   The patient presents with persistent right knee pain and instability.  She has been experiencing persistent right knee pain and instability since January. Initially, after receiving a steroid injection, her symptoms improved for about two weeks but did not completely resolve. Currently, the knee remains swollen, feels unstable, and is accompanied by a clicking sensation. She describes the pain as mild but notes a sensation of instability, as if the knee might give way, although it has not done so. She has not experienced any new injuries since January, and her last orthopedic consultation was approximately two years ago. She has not had any recent imaging studies of the knee.  For management at home, she has been using ice and taking Motrin intermittently, typically for three days at a time, which she finds helpful. However, she is cautious about frequent use of Motrin. She has also tried wearing a Velcro brace and support hose for additional support.  In January, her lab results showed a slightly elevated glucose level at 101, low vitamin D, elevated cholesterol, and slightly reduced renal function (GFR). She is currently on vitamin D supplementation.           Relevant past medical, surgical, family, and social history reviewed and updated as indicated.  Allergies and medications reviewed and updated. Data reviewed: Chart in Epic.   Past Medical History:  Diagnosis Date   Cancer (HCC)    melanoma, back   Depression    GERD (gastroesophageal reflux disease)    Hypercholesteremia    Knee pain    Lower back pain    OSA (obstructive sleep apnea)    Sleep apnea     Past Surgical History:  Procedure Laterality Date   COLONOSCOPY     LAPAROSCOPIC ROUX-EN-Y GASTRIC BYPASS WITH UPPER ENDOSCOPY AND REMOVAL OF LAP BAND  2008   OTHER SURGICAL HISTORY     Lap band removal 03/11/23   TONSILLECTOMY  1980    Social History   Socioeconomic History   Marital status: Divorced    Spouse name: Not on file   Number of children: 2   Years of education: college   Highest education level: Not on file  Occupational History    Comment: RN  Tobacco Use   Smoking status: Former    Current packs/day: 0.00    Average packs/day: 1 pack/day for 10.0 years (10.0 ttl pk-yrs)    Types: Cigarettes    Start date: 02/06/1986    Quit date: 02/07/1996    Years since quitting: 27.4    Passive exposure: Past   Smokeless tobacco: Never  Vaping Use   Vaping status: Never Used  Substance and Sexual Activity  Alcohol use: Yes    Comment: 1/week   Drug use: Never   Sexual activity: Yes  Other Topics Concern   Not on file  Social History Narrative   Lives with son   Caffeine 16 oz daily   Social Drivers of Health   Financial Resource Strain: Not on file  Food Insecurity: No Food Insecurity (05/22/2023)   Hunger Vital Sign    Worried About Running Out of Food in the Last Year: Never true    Ran Out of Food in the Last Year: Never true  Transportation Needs: No Transportation Needs (05/22/2023)   PRAPARE - Administrator, Civil Service (Medical): No    Lack of Transportation (Non-Medical): No  Physical Activity: Not on file   Stress: No Stress Concern Present (05/22/2023)   [employer] of Occupational Health - Occupational Stress Questionnaire    Feeling of Stress : Not at all  Social Connections: Moderately Isolated (05/22/2023)   Social Connection and Isolation Panel [NHANES]    Frequency of Communication with Friends and Family: More than three times a week    Frequency of Social Gatherings with Friends and Family: Once a week    Attends Religious Services: Never    Database administrator or Organizations: Yes    Attends Banker Meetings: 1 to 4 times per year    Marital Status: Divorced  Intimate Partner Violence: Not At Risk (05/22/2023)   Humiliation, Afraid, Rape, and Kick questionnaire    Fear of Current or Ex-Partner: No    Emotionally Abused: No    Physically Abused: No    Sexually Abused: No    Outpatient Encounter Medications as of 07/03/2023  Medication Sig   ALPRAZolam (XANAX) 0.5 MG tablet Take 1 tablet (0.5 mg total) by mouth 2 (two) times daily as needed for anxiety.   Dextromethorphan-buPROPion ER (AUVELITY) 45-105 MG TBCR Take 1 tablet by mouth 2 (two) times daily.   estradiol (VIVELLE-DOT) 0.1 MG/24HR patch Place 1 patch (0.1 mg total) onto the skin 2 (two) times a week.   lithium 300 MG tablet Take 1 tablet (300 mg total) by mouth at bedtime.   lurasidone (LATUDA) 40 MG TABS tablet Take 1 tablet (40 mg total) by mouth daily with supper.   metroNIDAZOLE (METROCREAM) 0.75 % cream Apply as directed to affected area twice a day; Use of SPF is recommended. (Patient taking differently: Apply 1 Application topically 2 (two) times daily as needed (rosacea).)   NON FORMULARY Pt uses a c-pap nightly   progesterone (PROMETRIUM) 100 MG capsule Take 1 capsule every day by oral route at bedtime.   Vilazodone HCl (VIIBRYD) 40 MG TABS Take 1 tablet by mouth daily.   Vitamin D, Ergocalciferol, (DRISDOL) 1.25 MG (50000 UNIT) CAPS capsule Take 1 capsule (50,000 Units total) by mouth every  7 (seven) days.   zolpidem (AMBIEN) 10 MG tablet Take 1/2 - 1 tablet by mouth at bedtime as needed for sleep.   [EXPIRED] methylPREDNISolone acetate (DEPO-MEDROL) injection 40 mg    No facility-administered encounter medications on file as of 07/03/2023.    No Known Allergies  Pertinent ROS per HPI, otherwise unremarkable      Objective:  BP 114/71   Pulse 76   Temp (!) 97.3 F (36.3 C)   Ht 5\' 5"  (1.651 m)   Wt 229 lb 3.2 oz (104 kg)   SpO2 93%   BMI 38.14 kg/m    Wt Readings from Last 3 Encounters:  07/03/23 229 lb 3.2 oz (104 kg)  05/22/23 229 lb (103.9 kg)  03/11/23 215 lb (97.5 kg)    Physical Exam Vitals and nursing note reviewed.  Constitutional:      General: She is not in acute distress.    Appearance: Normal appearance. She is obese. She is not ill-appearing, toxic-appearing or diaphoretic.  HENT:     Head: Normocephalic and atraumatic.     Nose: Nose normal.     Mouth/Throat:     Mouth: Mucous membranes are moist.  Eyes:     Conjunctiva/sclera: Conjunctivae normal.     Pupils: Pupils are equal, round, and reactive to light.  Cardiovascular:     Rate and Rhythm: Normal rate and regular rhythm.     Pulses: Normal pulses.     Heart sounds: Normal heart sounds.  Pulmonary:     Effort: Pulmonary effort is normal.     Breath sounds: Normal breath sounds.  Musculoskeletal:     Cervical back: Neck supple.     Right upper leg: Normal.     Right knee: Swelling present. No bony tenderness or crepitus. Decreased range of motion. Tenderness present. Normal alignment, normal meniscus and normal patellar mobility.     Right lower leg: Normal. No edema.     Left lower leg: No edema.  Skin:    General: Skin is warm and dry.     Capillary Refill: Capillary refill takes less than 2 seconds.  Neurological:     General: No focal deficit present.     Mental Status: She is alert and oriented to person, place, and time.  Psychiatric:        Mood and Affect: Mood  normal.        Behavior: Behavior normal.        Thought Content: Thought content normal.        Judgment: Judgment normal.     X-Ray: Right knee: No acute findings. Preliminary x-ray reading by Kari Baars, FNP-C, [facility].   Results for orders placed or performed in visit on 05/22/23  Bayer DCA Hb A1c Waived   Collection Time: 05/22/23  2:12 PM  Result Value Ref Range   HB A1C (BAYER DCA - WAIVED) 4.8 4.8 - 5.6 %  CBC with Differential/Platelet   Collection Time: 05/22/23  2:13 PM  Result Value Ref Range   WBC 6.5 3.4 - 10.8 x10E3/uL   RBC 4.85 3.77 - 5.28 x10E6/uL   Hemoglobin 13.9 11.1 - 15.9 g/dL   Hematocrit 16.1 09.6 - 46.6 %   MCV 86 79 - 97 fL   MCH 28.7 26.6 - 33.0 pg   MCHC 33.5 31.5 - 35.7 g/dL   RDW 04.5 40.9 - 81.1 %   Platelets 292 150 - 450 x10E3/uL   Neutrophils 58 Not Estab. %   Lymphs 27 Not Estab. %   Monocytes 9 Not Estab. %   Eos 4 Not Estab. %   Basos 1 Not Estab. %   Neutrophils Absolute 3.9 1.4 - 7.0 x10E3/uL   Lymphocytes Absolute 1.8 0.7 - 3.1 x10E3/uL   Monocytes Absolute 0.6 0.1 - 0.9 x10E3/uL   EOS (ABSOLUTE) 0.3 0.0 - 0.4 x10E3/uL   Basophils Absolute 0.1 0.0 - 0.2 x10E3/uL   Immature Granulocytes 1 Not Estab. %   Immature Grans (Abs) 0.0 0.0 - 0.1 x10E3/uL  CMP14+EGFR   Collection Time: 05/22/23  2:13 PM  Result Value Ref Range   Glucose 101 (H) 70 - 99 mg/dL   BUN  14 6 - 24 mg/dL   Creatinine, Ser 0.86 (H) 0.57 - 1.00 mg/dL   eGFR 59 (L) >57 QI/ONG/2.95   BUN/Creatinine Ratio 13 9 - 23   Sodium 141 134 - 144 mmol/L   Potassium 4.4 3.5 - 5.2 mmol/L   Chloride 105 96 - 106 mmol/L   CO2 22 20 - 29 mmol/L   Calcium 8.7 8.7 - 10.2 mg/dL   Total Protein 6.4 6.0 - 8.5 g/dL   Albumin 4.0 3.8 - 4.9 g/dL   Globulin, Total 2.4 1.5 - 4.5 g/dL   Bilirubin Total 0.2 0.0 - 1.2 mg/dL   Alkaline Phosphatase 77 44 - 121 IU/L   AST 13 0 - 40 IU/L   ALT 8 0 - 32 IU/L  Lipid panel   Collection Time: 05/22/23  2:13 PM  Result Value Ref Range    Cholesterol, Total 210 (H) 100 - 199 mg/dL   Triglycerides 284 (H) 0 - 149 mg/dL   HDL 40 >13 mg/dL   VLDL Cholesterol Cal 45 (H) 5 - 40 mg/dL   LDL Chol Calc (NIH) 244 (H) 0 - 99 mg/dL   Chol/HDL Ratio 5.3 (H) 0.0 - 4.4 ratio  VITAMIN D 25 Hydroxy (Vit-D Deficiency, Fractures)   Collection Time: 05/22/23  2:13 PM  Result Value Ref Range   Vit D, 25-Hydroxy 24.6 (L) 30.0 - 100.0 ng/mL  Thyroid Panel With TSH   Collection Time: 05/22/23  2:13 PM  Result Value Ref Range   TSH 2.410 0.450 - 4.500 uIU/mL   T4, Total 7.1 4.5 - 12.0 ug/dL   T3 Uptake Ratio 24 24 - 39 %   Free Thyroxine Index 1.7 1.2 - 4.9  Specimen status report   Collection Time: 05/22/23  2:13 PM  Result Value Ref Range   specimen status report Comment        Pertinent labs & imaging results that were available during my care of the patient were reviewed by me and considered in my medical decision making.  Assessment & Plan:  [place] was seen today for knee pain.  Diagnoses and all orders for this visit:  Prepatellar bursitis of right knee -     DG Knee 1-2 Views Right -     Ambulatory referral to Orthopedic Surgery -     methylPREDNISolone acetate (DEPO-MEDROL) injection 40 mg  Chronic pain of right knee -     DG Knee 1-2 Views Right -     Ambulatory referral to Orthopedic Surgery -     methylPREDNISolone acetate (DEPO-MEDROL) injection 40 mg     Assessment and Plan    Right knee bursitis Chronic right knee bursitis with persistent swelling, instability, and clicking. Symptoms improved temporarily with previous steroid treatment but have since returned. No new injuries reported since January. The knee feels unstable but has not given way. She has been using ice and intermittent Motrin for pain management, but is concerned about long-term use of Motrin. - Order x-ray of the right knee - Administer steroid injection in the office - Advise wearing a knee brace for support - Refer to orthopedics in  [facility] for further evaluation - Recommend naproxen twice daily with food for two weeks as an anti-inflammatory, avoiding Motrin during this period  Decreased renal function Slightly decreased renal function with low GFR noted in January labs. Advised to increase hydration and avoid excessive use of medications that could harm the kidneys. - Advise increased hydration - Avoid excessive use of nephrotoxic medications  Vitamin D deficiency Vitamin D deficiency noted in January labs. She is currently on vitamin D supplementation.  Elevated cholesterol Cholesterol levels were elevated in January labs.  Mildly elevated glucose Glucose level was slightly elevated at 101 in January labs.  Follow-up Follow-up plans discussed for orthopedic evaluation and further management of knee symptoms. - Schedule follow-up appointment with orthopedics in [place] - Instruct to report any worsening of knee symptoms before orthopedic appointment          Continue all other maintenance medications.  Follow up plan: Return if symptoms worsen or fail to improve.   Continue healthy lifestyle choices, including diet (rich in fruits, vegetables, and lean proteins, and low in salt and simple carbohydrates) and exercise (at least 30 minutes of moderate physical activity daily).  Educational handout given for Knee pain  The above assessment and management plan was discussed with the patient. The patient verbalized understanding of and has agreed to the management plan. Patient is aware to call the clinic if they develop any new symptoms or if symptoms persist or worsen. Patient is aware when to return to the clinic for a follow-up visit. Patient educated on when it is appropriate to go to the emergency department.   Kari Baars, FNP-C [facility] [phone]

## 2023-07-10 DIAGNOSIS — Z6838 Body mass index (BMI) 38.0-38.9, adult: Secondary | ICD-10-CM | POA: Diagnosis not present

## 2023-07-10 DIAGNOSIS — Z01419 Encounter for gynecological examination (general) (routine) without abnormal findings: Secondary | ICD-10-CM | POA: Diagnosis not present

## 2023-07-10 DIAGNOSIS — Z1231 Encounter for screening mammogram for malignant neoplasm of breast: Secondary | ICD-10-CM | POA: Diagnosis not present

## 2023-07-10 DIAGNOSIS — Z124 Encounter for screening for malignant neoplasm of cervix: Secondary | ICD-10-CM | POA: Diagnosis not present

## 2023-07-10 DIAGNOSIS — Z1151 Encounter for screening for human papillomavirus (HPV): Secondary | ICD-10-CM | POA: Diagnosis not present

## 2023-07-16 ENCOUNTER — Other Ambulatory Visit (HOSPITAL_BASED_OUTPATIENT_CLINIC_OR_DEPARTMENT_OTHER): Payer: Self-pay

## 2023-07-16 MED ORDER — PROGESTERONE MICRONIZED 100 MG PO CAPS
100.0000 mg | ORAL_CAPSULE | Freq: Every day | ORAL | 2 refills | Status: DC
  Filled 2023-07-16: qty 90, 90d supply, fill #0
  Filled 2023-10-28: qty 90, 90d supply, fill #1
  Filled 2024-01-29: qty 90, 90d supply, fill #2

## 2023-07-17 ENCOUNTER — Encounter: Payer: Commercial Managed Care - PPO | Admitting: Family Medicine

## 2023-07-24 ENCOUNTER — Ambulatory Visit: Admitting: Orthopedic Surgery

## 2023-07-24 DIAGNOSIS — M65961 Unspecified synovitis and tenosynovitis, right lower leg: Secondary | ICD-10-CM

## 2023-07-27 ENCOUNTER — Encounter: Payer: Self-pay | Admitting: Orthopedic Surgery

## 2023-07-27 NOTE — Progress Notes (Signed)
 Office Visit Note   Patient: Aimee Hart           Date of Birth: 03-14-1966           MRN: [id number] Visit Date: 07/24/2023 Requested by: Sonny Masters, FNP [address] [place],  [place] [address] PCP: Sonny Masters, FNP  Subjective: Chief Complaint  Patient presents with   Right Knee - Pain    HPI: Aimee Hart is a 58 y.o. female who presents to the office reporting right knee pain since January 2025.  Denies any history of injury.  She was given oral steroids with some relief.  Had an IM steroid shot in March which gave her relief for 1 week.  Does report some stiffness and swelling in that right knee.  No prior knee surgery.  Does state that the knee pops a lot more since January.  Takes naproxen as well.  She really has not been able to exercise on the treadmill due to her knee.  The pain does not wake her from sleep at night.  Overall she is about 30% better compared with early January.  She works as a Psychologist, forensic job 8-hour shifts..                ROS: All systems reviewed are negative as they relate to the chief complaint within the history of present illness.  Patient denies fevers or chills.  Assessment & Plan: Visit Diagnoses:  1. Synovitis of right knee     Plan: Impression is right knee pain with not too much arthritis on plain radiographs and normal knee exam.  Possible she could have a degenerative meniscal tear but without an effusion.  May have some inflammation in the knee itself as well.  Does not look like this is referred pain from the back or hip.  Plan is observation.  Could consider cortisone injection in the knee and/or MRI scanning if symptoms persist.  She will let us know if her knee does not continue to improve.  Follow-Up Instructions: No follow-ups on file.   Orders:  No orders of the defined types were placed in this encounter.  No orders of the defined types were placed in this encounter.     Procedures: No procedures  performed   Clinical Data: No additional findings.  Objective: Vital Signs: There were no vitals taken for this visit.  Physical Exam:  Constitutional: Patient appears well-developed HEENT:  Head: Normocephalic Eyes:EOM are normal Neck: Normal range of motion Cardiovascular: Normal rate Pulmonary/chest: Effort normal Neurologic: Patient is alert Skin: Skin is warm Psychiatric: Patient has normal mood and affect  Ortho Exam: Ortho exam demonstrates full active and passive range of motion of the right knee with normal gait alignment.  No effusion in the knee.  Pedal pulses palpable.  No masses lymphadenopathy or skin changes noted in that right knee region.  No groin pain with internal or external rotation of the leg.  Collateral crucial ligaments are stable.  Not too much in terms of focal joint line tenderness.  Specialty Comments:  No specialty comments available.  Imaging: No results found.   PMFS History: Patient Active Problem List   Diagnosis Date Noted   Major depressive disorder, recurrent episode, moderate (HCC) 05/22/2023   MDD (major depressive disorder) 01/31/2023   Other insomnia 10/03/2022   Former smoker 10/03/2022   SOB (shortness of breath) on exertion 09/19/2022   Other fatigue 09/19/2022   LAP-BAND surgery status  09/03/2022   OSA on CPAP 05/05/2021   Obesity (BMI 30-39.9) 12/25/2018   GERD without esophagitis 05/14/2018   Recurrent major depression (HCC) 01/27/2018   Past Medical History:  Diagnosis Date   Cancer (HCC)    melanoma, back   Depression    GERD (gastroesophageal reflux disease)    Hypercholesteremia    Knee pain    Lower back pain    OSA (obstructive sleep apnea)    Sleep apnea     Family History  Problem Relation Age of Onset   Cancer Mother    Hyperlipidemia Mother    Melanoma Mother    Hypertension Mother    Depression Mother    Thyroid disease Mother    Hyperlipidemia Father    Hypertension Father    Heart disease  Father    Thyroid disease Father    Alcoholism Father    Obesity Father    Cancer Sister        bladder   Post-traumatic stress disorder Brother    Cancer Maternal Grandmother        lung   Emphysema Maternal Grandfather    Alzheimer's disease Paternal Grandmother    Heart disease Paternal Grandfather    Kidney disease Daughter     Past Surgical History:  Procedure Laterality Date   COLONOSCOPY     LAPAROSCOPIC ROUX-EN-Y GASTRIC BYPASS WITH UPPER ENDOSCOPY AND REMOVAL OF LAP BAND  2008   OTHER SURGICAL HISTORY     Lap band removal 03/11/23   TONSILLECTOMY  1980   Social History   Occupational History    Comment: RN  Tobacco Use   Smoking status: Former    Current packs/day: 0.00    Average packs/day: 1 pack/day for 10.0 years (10.0 ttl pk-yrs)    Types: Cigarettes    Start date: 02/06/1986    Quit date: 02/07/1996    Years since quitting: 27.4    Passive exposure: Past   Smokeless tobacco: Never  Vaping Use   Vaping status: Never Used  Substance and Sexual Activity   Alcohol use: Yes    Comment: 1/week   Drug use: Never   Sexual activity: Yes

## 2023-07-31 ENCOUNTER — Ambulatory Visit: Payer: Commercial Managed Care - PPO | Admitting: Physician Assistant

## 2023-08-07 ENCOUNTER — Encounter: Payer: Self-pay | Admitting: Family Medicine

## 2023-08-07 ENCOUNTER — Ambulatory Visit (INDEPENDENT_AMBULATORY_CARE_PROVIDER_SITE_OTHER): Admitting: Family Medicine

## 2023-08-07 ENCOUNTER — Ambulatory Visit (HOSPITAL_COMMUNITY)
Admission: RE | Admit: 2023-08-07 | Discharge: 2023-08-07 | Disposition: A | Discharge status: Home / Self Care | Source: Ambulatory Visit | Attending: Family Medicine | Admitting: Family Medicine

## 2023-08-07 VITALS — BP 108/74 | HR 76 | Temp 96.9°F | Ht 65.0 in | Wt 233.8 lb

## 2023-08-07 DIAGNOSIS — R1013 Epigastric pain: Secondary | ICD-10-CM

## 2023-08-07 DIAGNOSIS — R110 Nausea: Secondary | ICD-10-CM | POA: Diagnosis not present

## 2023-08-07 DIAGNOSIS — R1011 Right upper quadrant pain: Secondary | ICD-10-CM

## 2023-08-07 DIAGNOSIS — N289 Disorder of kidney and ureter, unspecified: Secondary | ICD-10-CM | POA: Diagnosis not present

## 2023-08-07 DIAGNOSIS — K7689 Other specified diseases of liver: Secondary | ICD-10-CM | POA: Diagnosis not present

## 2023-08-07 NOTE — Progress Notes (Signed)
 Subjective:  Patient ID: OLLA Hart, female    DOB: [birth year], 58 y.o.   MRN: [id number]  Patient Care Team: Aimee Masters, FNP as PCP - General (Family Medicine) Aimee Fus, MD as PCP - Cardiology (Cardiology) [facility], [facility]   Chief Complaint:  Abdominal Pain (X 2 weeks.  States that the pain shoots through to her back.  Motrin helps with pain some. )   HPI: Aimee Hart is a 58 y.o. female presenting on 08/07/2023 for Abdominal Pain (X 2 weeks.  States that the pain shoots through to her back.  Motrin helps with pain some. )   Discussed the use of AI scribe software for clinical note transcription with the patient, who gave verbal consent to proceed.  History of Present Illness   Aimee Hart is a 58 year old female who presents with intermittent abdominal pain and nausea.  She has been experiencing intermittent abdominal pain for the past two weeks, primarily located in the center of her abdomen, occasionally radiating to the side and back to the center. The pain sometimes feels like an 'emptiness' that is not relieved by eating and occasionally radiates to her back. There is tenderness on the right side of her abdomen. The pain does not change with the consumption of fried or greasy foods, although she consumes a lot of candy and chips.  She experiences associated nausea without vomiting. No diarrhea, but she reports chronic constipation for which she frequently takes a stool softener. Over-the-counter medications like Tums and Rolaids have not provided relief. She initially took Motrin, which provided slight improvement when she thought the pain might be related to a gynecological issue.  No fever, unexplained fatigue, or exertional fatigue. However, she experienced significant fatigue on one occasion, requiring her to leave work early and sleep for three hours despite having had a full night's sleep. No weight loss, but she experiences night  sweats, which she attributes to menopause.  Family history is significant for her father having a cardiac event in his sixties, for which he received a stent.          Relevant past medical, surgical, family, and social history reviewed and updated as indicated.  Allergies and medications reviewed and updated. Data reviewed: Chart in Epic.   Past Medical History:  Diagnosis Date   Cancer (HCC)    melanoma, back   Depression    GERD (gastroesophageal reflux disease)    Hypercholesteremia    Knee pain    Lower back pain    OSA (obstructive sleep apnea)    Sleep apnea     Past Surgical History:  Procedure Laterality Date   COLONOSCOPY     LAPAROSCOPIC ROUX-EN-Y GASTRIC BYPASS WITH UPPER ENDOSCOPY AND REMOVAL OF LAP BAND  2008   OTHER SURGICAL HISTORY     Lap band removal 03/11/23   TONSILLECTOMY  1980    Social History   Socioeconomic History   Marital status: Divorced    Spouse name: Not on file   Number of children: 2   Years of education: college   Highest education level: Not on file  Occupational History    Comment: RN  Tobacco Use   Smoking status: Former    Current packs/day: 0.00    Average packs/day: 1 pack/day for 10.0 years (10.0 ttl pk-yrs)    Types: Cigarettes    Start date: 02/06/1986    Quit date: 02/07/1996    Years since  quitting: 27.5    Passive exposure: Past   Smokeless tobacco: Never  Vaping Use   Vaping status: Never Used  Substance and Sexual Activity   Alcohol use: Yes    Comment: 1/week   Drug use: Never   Sexual activity: Yes  Other Topics Concern   Not on file  Social History Narrative   Lives with son   Caffeine 16 oz daily   Social Drivers of Corporate investment banker Strain: Not on file  Food Insecurity: No Food Insecurity (05/22/2023)   Hunger Vital Sign    Worried About Running Out of Food in the Last Year: Never true    Ran Out of Food in the Last Year: Never true  Transportation Needs: No Transportation Needs  (05/22/2023)   PRAPARE - Administrator, Civil Service (Medical): No    Lack of Transportation (Non-Medical): No  Physical Activity: Not on file  Stress: No Stress Concern Present (05/22/2023)   [employer] of Occupational Health - Occupational Stress Questionnaire    Feeling of Stress : Not at all  Social Connections: Moderately Isolated (05/22/2023)   Social Connection and Isolation Panel [NHANES]    Frequency of Communication with Friends and Family: More than three times a week    Frequency of Social Gatherings with Friends and Family: Once a week    Attends Religious Services: Never    Database administrator or Organizations: Yes    Attends Banker Meetings: 1 to 4 times per year    Marital Status: Divorced  Intimate Partner Violence: Not At Risk (05/22/2023)   Humiliation, Afraid, Rape, and Kick questionnaire    Fear of Current or Ex-Partner: No    Emotionally Abused: No    Physically Abused: No    Sexually Abused: No    Outpatient Encounter Medications as of 08/07/2023  Medication Sig   ALPRAZolam (XANAX) 0.5 MG tablet Take 1 tablet (0.5 mg total) by mouth 2 (two) times daily as needed for anxiety.   Dextromethorphan-buPROPion ER (AUVELITY) 45-105 MG TBCR Take 1 tablet by mouth 2 (two) times daily.   estradiol (VIVELLE-DOT) 0.1 MG/24HR patch Place 1 patch (0.1 mg total) onto the skin 2 (two) times a week.   lithium 300 MG tablet Take 1 tablet (300 mg total) by mouth at bedtime.   lurasidone (LATUDA) 40 MG TABS tablet Take 1 tablet (40 mg total) by mouth daily with supper.   metroNIDAZOLE (METROCREAM) 0.75 % cream Apply as directed to affected area twice a day; Use of SPF is recommended. (Patient taking differently: Apply 1 Application topically 2 (two) times daily as needed (rosacea).)   NON FORMULARY Pt uses a c-pap nightly   progesterone (PROMETRIUM) 100 MG capsule Take 1 capsule every day by oral route at bedtime.   progesterone (PROMETRIUM) 100 MG  capsule Take 1 capsule (100 mg total) by mouth at bedtime.   Vilazodone HCl (VIIBRYD) 40 MG TABS Take 1 tablet by mouth daily.   Vitamin D, Ergocalciferol, (DRISDOL) 1.25 MG (50000 UNIT) CAPS capsule Take 1 capsule (50,000 Units total) by mouth every 7 (seven) days.   zolpidem (AMBIEN) 10 MG tablet Take 1/2 - 1 tablet by mouth at bedtime as needed for sleep.   No facility-administered encounter medications on file as of 08/07/2023.    No Known Allergies  Pertinent ROS per HPI, otherwise unremarkable      Objective:  BP 108/74   Pulse 76   Temp (!) 96.9 F (  36.1 C)   Ht 5\' 5"  (1.651 m)   Wt 233 lb 12.8 oz (106.1 kg)   SpO2 95%   BMI 38.91 kg/m    Wt Readings from Last 3 Encounters:  08/07/23 233 lb 12.8 oz (106.1 kg)  07/03/23 229 lb 3.2 oz (104 kg)  05/22/23 229 lb (103.9 kg)    Physical Exam Vitals and nursing note reviewed.  Constitutional:      General: She is not in acute distress.    Appearance: Normal appearance. She is well-developed. She is obese. She is not ill-appearing, toxic-appearing or diaphoretic.  HENT:     Head: Normocephalic and atraumatic.     Mouth/Throat:     Mouth: Mucous membranes are moist.  Eyes:     Extraocular Movements: Extraocular movements intact.     Pupils: Pupils are equal, round, and reactive to light.  Cardiovascular:     Rate and Rhythm: Normal rate and regular rhythm.     Heart sounds: Normal heart sounds.  Pulmonary:     Effort: Pulmonary effort is normal.     Breath sounds: Normal breath sounds.  Abdominal:     General: Abdomen is protuberant. Bowel sounds are normal. There is no distension or abdominal bruit. There are no signs of injury.     Palpations: Abdomen is soft.     Tenderness: There is abdominal tenderness in the right upper quadrant.     Hernia: No hernia is present.  Musculoskeletal:     Cervical back: Neck supple.  Skin:    General: Skin is warm and dry.     Capillary Refill: Capillary refill takes less than  2 seconds.  Neurological:     General: No focal deficit present.     Mental Status: She is alert and oriented to person, place, and time.  Psychiatric:        Mood and Affect: Mood normal.        Behavior: Behavior normal.        Thought Content: Thought content normal.        Judgment: Judgment normal.      Results for orders placed or performed in visit on 05/22/23  Bayer DCA Hb A1c Waived   Collection Time: 05/22/23  2:12 PM  Result Value Ref Range   HB A1C (BAYER DCA - WAIVED) 4.8 4.8 - 5.6 %  CBC with Differential/Platelet   Collection Time: 05/22/23  2:13 PM  Result Value Ref Range   WBC 6.5 3.4 - 10.8 x10E3/uL   RBC 4.85 3.77 - 5.28 x10E6/uL   Hemoglobin 13.9 11.1 - 15.9 g/dL   Hematocrit 16.1 09.6 - 46.6 %   MCV 86 79 - 97 fL   MCH 28.7 26.6 - 33.0 pg   MCHC 33.5 31.5 - 35.7 g/dL   RDW 04.5 40.9 - 81.1 %   Platelets 292 150 - 450 x10E3/uL   Neutrophils 58 Not Estab. %   Lymphs 27 Not Estab. %   Monocytes 9 Not Estab. %   Eos 4 Not Estab. %   Basos 1 Not Estab. %   Neutrophils Absolute 3.9 1.4 - 7.0 x10E3/uL   Lymphocytes Absolute 1.8 0.7 - 3.1 x10E3/uL   Monocytes Absolute 0.6 0.1 - 0.9 x10E3/uL   EOS (ABSOLUTE) 0.3 0.0 - 0.4 x10E3/uL   Basophils Absolute 0.1 0.0 - 0.2 x10E3/uL   Immature Granulocytes 1 Not Estab. %   Immature Grans (Abs) 0.0 0.0 - 0.1 x10E3/uL  CMP14+EGFR   Collection Time: 05/22/23  2:13 PM  Result Value Ref Range   Glucose 101 (H) 70 - 99 mg/dL   BUN 14 6 - 24 mg/dL   Creatinine, Ser 9.56 (H) 0.57 - 1.00 mg/dL   eGFR 59 (L) >21 HY/QMV/7.84   BUN/Creatinine Ratio 13 9 - 23   Sodium 141 134 - 144 mmol/L   Potassium 4.4 3.5 - 5.2 mmol/L   Chloride 105 96 - 106 mmol/L   CO2 22 20 - 29 mmol/L   Calcium 8.7 8.7 - 10.2 mg/dL   Total Protein 6.4 6.0 - 8.5 g/dL   Albumin 4.0 3.8 - 4.9 g/dL   Globulin, Total 2.4 1.5 - 4.5 g/dL   Bilirubin Total 0.2 0.0 - 1.2 mg/dL   Alkaline Phosphatase 77 44 - 121 IU/L   AST 13 0 - 40 IU/L   ALT 8 0 - 32  IU/L  Lipid panel   Collection Time: 05/22/23  2:13 PM  Result Value Ref Range   Cholesterol, Total 210 (H) 100 - 199 mg/dL   Triglycerides 696 (H) 0 - 149 mg/dL   HDL 40 >29 mg/dL   VLDL Cholesterol Cal 45 (H) 5 - 40 mg/dL   LDL Chol Calc (NIH) 528 (H) 0 - 99 mg/dL   Chol/HDL Ratio 5.3 (H) 0.0 - 4.4 ratio  VITAMIN D 25 Hydroxy (Vit-D Deficiency, Fractures)   Collection Time: 05/22/23  2:13 PM  Result Value Ref Range   Vit D, 25-Hydroxy 24.6 (L) 30.0 - 100.0 ng/mL  Thyroid Panel With TSH   Collection Time: 05/22/23  2:13 PM  Result Value Ref Range   TSH 2.410 0.450 - 4.500 uIU/mL   T4, Total 7.1 4.5 - 12.0 ug/dL   T3 Uptake Ratio 24 24 - 39 %   Free Thyroxine Index 1.7 1.2 - 4.9  Specimen status report   Collection Time: 05/22/23  2:13 PM  Result Value Ref Range   specimen status report Comment      EKG: SR 70, PR 160 ms, QT 380 ms, low voltage. No acute ST-T changes, no significant changes from prior EKG. Kari Baars, FNP-C  Pertinent labs & imaging results that were available during my care of the patient were reviewed by me and considered in my medical decision making.  Assessment & Plan:  Mihaela was seen today for abdominal pain.  Diagnoses and all orders for this visit:  RUQ pain -     US Abdomen Limited RUQ (LIVER/GB); Future -     Amylase -     Lipase -     CMP14+EGFR -     CBC with Differential/Platelet -     EKG 12-Lead  Epigastric pain -     US Abdomen Limited RUQ (LIVER/GB); Future -     Amylase -     Lipase -     CMP14+EGFR -     CBC with Differential/Platelet -     EKG 12-Lead     Assessment and Plan    Abdominal Pain Intermittent epigastric abdominal pain for two weeks, radiating to the back, associated with nausea but no vomiting, diarrhea, or fever. Pain is not exacerbated by fatty foods. Differential diagnosis includes gallbladder disease and cardiac issues. Family history of cardiac disease. EKG showed no changes from prior, ruling out  immediate cardiac concerns. Further evaluation needed for gallbladder function and other causes. - Order right upper quadrant ultrasound to assess gallbladder/liver - Order lab work including amylase, lipase, and liver function tests to evaluate gallbladder function and  rule out infectious causes - Schedule right upper quadrant ultrasound at [facility] radiology department - Inform her that results will be communicated once available          Continue all other maintenance medications.  Follow up plan: Return if symptoms worsen or fail to improve.   Continue healthy lifestyle choices, including diet (rich in fruits, vegetables, and lean proteins, and low in salt and simple carbohydrates) and exercise (at least 30 minutes of moderate physical activity daily).   The above assessment and management plan was discussed with the patient. The patient verbalized understanding of and has agreed to the management plan. Patient is aware to call the clinic if they develop any new symptoms or if symptoms persist or worsen. Patient is aware when to return to the clinic for a follow-up visit. Patient educated on when it is appropriate to go to the emergency department.   Kattie Parrot, FNP-C [facility] [phone]

## 2023-08-08 LAB — CBC WITH DIFFERENTIAL/PLATELET
Basophils Absolute: 0.1 10*3/uL (ref 0.0–0.2)
Basos: 2 %
EOS (ABSOLUTE): 1.3 10*3/uL — ABNORMAL HIGH (ref 0.0–0.4)
Eos: 19 %
Hematocrit: 42.4 % (ref 34.0–46.6)
Hemoglobin: 14.4 g/dL (ref 11.1–15.9)
Immature Grans (Abs): 0 10*3/uL (ref 0.0–0.1)
Immature Granulocytes: 0 %
Lymphocytes Absolute: 1.5 10*3/uL (ref 0.7–3.1)
Lymphs: 22 %
MCH: 29.9 pg (ref 26.6–33.0)
MCHC: 34 g/dL (ref 31.5–35.7)
MCV: 88 fL (ref 79–97)
Monocytes Absolute: 0.5 10*3/uL (ref 0.1–0.9)
Monocytes: 7 %
Neutrophils Absolute: 3.5 10*3/uL (ref 1.4–7.0)
Neutrophils: 50 %
Platelets: 261 10*3/uL (ref 150–450)
RBC: 4.82 x10E6/uL (ref 3.77–5.28)
RDW: 13.7 % (ref 11.7–15.4)
WBC: 6.9 10*3/uL (ref 3.4–10.8)

## 2023-08-08 LAB — CMP14+EGFR
ALT: 9 IU/L (ref 0–32)
AST: 16 IU/L (ref 0–40)
Albumin: 4.2 g/dL (ref 3.8–4.9)
Alkaline Phosphatase: 77 IU/L (ref 44–121)
BUN/Creatinine Ratio: 15 (ref 9–23)
BUN: 16 mg/dL (ref 6–24)
Bilirubin Total: 0.3 mg/dL (ref 0.0–1.2)
CO2: 22 mmol/L (ref 20–29)
Calcium: 9 mg/dL (ref 8.7–10.2)
Chloride: 105 mmol/L (ref 96–106)
Creatinine, Ser: 1.1 mg/dL — ABNORMAL HIGH (ref 0.57–1.00)
Globulin, Total: 2.2 g/dL (ref 1.5–4.5)
Glucose: 103 mg/dL — ABNORMAL HIGH (ref 70–99)
Potassium: 4.6 mmol/L (ref 3.5–5.2)
Sodium: 139 mmol/L (ref 134–144)
Total Protein: 6.4 g/dL (ref 6.0–8.5)
eGFR: 58 mL/min/{1.73_m2} — ABNORMAL LOW (ref >59)

## 2023-08-08 LAB — LIPASE: Lipase: 37 U/L (ref 14–72)

## 2023-08-08 LAB — AMYLASE: Amylase: 92 U/L (ref 31–110)

## 2023-08-08 NOTE — Addendum Note (Signed)
 Addended by: Galvin Jules on: 08/08/2023 07:54 AM   Modules accepted: Orders

## 2023-08-29 ENCOUNTER — Other Ambulatory Visit (HOSPITAL_BASED_OUTPATIENT_CLINIC_OR_DEPARTMENT_OTHER): Payer: Self-pay

## 2023-08-29 MED ORDER — ESTRADIOL 0.1 MG/24HR TD PTTW
1.0000 | MEDICATED_PATCH | TRANSDERMAL | 1 refills | Status: DC
  Filled 2023-08-29: qty 24, 84d supply, fill #0
  Filled 2023-12-29: qty 24, 84d supply, fill #1

## 2023-09-11 ENCOUNTER — Ambulatory Visit (INDEPENDENT_AMBULATORY_CARE_PROVIDER_SITE_OTHER): Payer: Self-pay | Admitting: Physician Assistant

## 2023-09-11 ENCOUNTER — Encounter: Payer: Self-pay | Admitting: Physician Assistant

## 2023-09-11 ENCOUNTER — Other Ambulatory Visit (HOSPITAL_BASED_OUTPATIENT_CLINIC_OR_DEPARTMENT_OTHER): Payer: Self-pay

## 2023-09-11 DIAGNOSIS — F411 Generalized anxiety disorder: Secondary | ICD-10-CM | POA: Diagnosis not present

## 2023-09-11 DIAGNOSIS — F329 Major depressive disorder, single episode, unspecified: Secondary | ICD-10-CM | POA: Diagnosis not present

## 2023-09-11 DIAGNOSIS — R7989 Other specified abnormal findings of blood chemistry: Secondary | ICD-10-CM

## 2023-09-11 DIAGNOSIS — Z79899 Other long term (current) drug therapy: Secondary | ICD-10-CM | POA: Diagnosis not present

## 2023-09-11 DIAGNOSIS — G4700 Insomnia, unspecified: Secondary | ICD-10-CM | POA: Diagnosis not present

## 2023-09-11 MED ORDER — AUVELITY 45-105 MG PO TBCR
1.0000 | EXTENDED_RELEASE_TABLET | Freq: Two times a day (BID) | ORAL | 3 refills | Status: AC
  Filled 2023-09-11: qty 180, 90d supply, fill #0

## 2023-09-11 MED ORDER — LURASIDONE HCL 40 MG PO TABS
40.0000 mg | ORAL_TABLET | Freq: Every day | ORAL | 1 refills | Status: DC
  Filled 2023-09-11: qty 90, 90d supply, fill #0
  Filled 2023-12-29: qty 90, 90d supply, fill #1

## 2023-09-11 MED ORDER — VILAZODONE HCL 40 MG PO TABS
40.0000 mg | ORAL_TABLET | Freq: Every day | ORAL | 3 refills | Status: AC
  Filled 2023-09-11: qty 90, 90d supply, fill #0
  Filled 2023-12-29: qty 90, 90d supply, fill #1
  Filled 2024-04-01: qty 90, 90d supply, fill #2

## 2023-09-11 MED ORDER — ALPRAZOLAM 0.5 MG PO TABS
0.5000 mg | ORAL_TABLET | Freq: Two times a day (BID) | ORAL | 5 refills | Status: DC | PRN
  Filled 2023-09-11: qty 30, 15d supply, fill #0
  Filled 2024-01-29: qty 30, 15d supply, fill #1

## 2023-09-11 NOTE — Progress Notes (Unsigned)
 [facility]  Patient ID: Aimee Hart,  MRN: [id number]  PCP: Galvin Jules, FNP  Date of Evaluation: 09/11/2023 Time spent:30 minutes  Chief Complaint:  Chief Complaint   Anxiety; Depression; Follow-up    HISTORY/CURRENT STATUS: HPI For routine med check.  Feels stable as far as meds go.  Energy and motivation are good most of the time.  Work is going well.   No extreme sadness, tearfulness, or feelings of hopelessness.  Sleeps well most of the time. ADLs and personal hygiene are normal.   Denies any changes in concentration, making decisions, or remembering things.  Appetite has not changed.  Weight is stable.  Anxiety is controlled. Xanax  is effective when needed.   Denies suicidal or homicidal thoughts.  Is concerned about kidney function.  Creatinine has been slightly elevated for past several labs.  Wonders if Lithium  is really worth the risk. It helped when she needed it last year for severe depression and SI but 'I'm not at that place right now.' Feels like it's not really helpful now.   Patient denies increased energy with decreased need for sleep, increased talkativeness, racing thoughts, impulsivity or risky behaviors, increased spending, increased libido, grandiosity, increased irritability or anger, paranoia, or hallucinations.  Denies dizziness, syncope, seizures, numbness, tingling, tremor, tics, unsteady gait, slurred speech, confusion. Denies muscle or joint pain, stiffness, or dystonia. Denies unexplained weight loss, frequent infections, or sores that heal slowly.  No polyphagia, polydipsia, or polyuria. Denies visual changes or paresthesias.   Individual Medical History/ Review of Systems: Changes? :Yes   right knee pain, left shoulder pain.  Dx w/ fatty liver.   Past medications for mental health diagnoses include: Zoloft, Prozac, Paxil, Abilify, Effexor, Celexa, Cymbalta, Lamictal, Ambien , melatonin, Wellbutrin  SR, Trintellix , Viibryd , Cerefolin  NAC (not sure if effective at all plus was expensive) Auvelity   Allergies: Patient has no known allergies.  Current Medications:  Current Outpatient Medications:    estradiol  (VIVELLE -DOT) 0.1 MG/24HR patch, Place 1 patch (0.1 mg total) onto the skin 2 (two) times a week., Disp: 24 patch, Rfl: 1   NON FORMULARY, Pt uses a c-pap nightly, Disp: , Rfl:    progesterone  (PROMETRIUM ) 100 MG capsule, Take 1 capsule every day by oral route at bedtime., Disp: 30 capsule, Rfl: 11   progesterone  (PROMETRIUM ) 100 MG capsule, Take 1 capsule (100 mg total) by mouth at bedtime., Disp: 90 capsule, Rfl: 2   Vitamin D , Ergocalciferol , (DRISDOL ) 1.25 MG (50000 UNIT) CAPS capsule, Take 1 capsule (50,000 Units total) by mouth every 7 (seven) days., Disp: 12 capsule, Rfl: 3   zolpidem  (AMBIEN ) 10 MG tablet, Take 1/2 - 1 tablet by mouth at bedtime as needed for sleep., Disp: 30 tablet, Rfl: 4   ALPRAZolam  (XANAX ) 0.5 MG tablet, Take 1 tablet (0.5 mg total) by mouth 2 (two) times daily as needed for anxiety., Disp: 30 tablet, Rfl: 5   Dextromethorphan -buPROPion  ER (AUVELITY ) 45-105 MG TBCR, Take 1 tablet by mouth 2 (two) times daily., Disp: 180 tablet, Rfl: 3   lurasidone  (LATUDA ) 40 MG TABS tablet, Take 1 tablet (40 mg total) by mouth daily with supper., Disp: 90 tablet, Rfl: 1   metroNIDAZOLE  (METROCREAM ) 0.75 % cream, Apply as directed to affected area twice a day; Use of SPF is recommended. (Patient not taking: Reported on 09/11/2023), Disp: 45 g, Rfl: 1   Vilazodone  HCl (VIIBRYD ) 40 MG TABS, Take 1 tablet by mouth daily., Disp: 90 tablet, Rfl: 3 Medication Side Effects: none  Family  Medical/ Social History: Changes?  No   MENTAL HEALTH EXAM:  There were no vitals taken for this visit.There is no height or weight on file to calculate BMI.  General Appearance: Casual, Neat and Well Groomed  Eye Contact:  Good  Speech:  Clear and Coherent and Normal Rate  Volume:  Normal  Mood:  Euthymic  Affect:  Congruent   Thought Process:  Goal Directed and Descriptions of Associations: Circumstantial  Orientation:  Full (Time, Place, and Person)  Thought Content: Logical   Suicidal Thoughts:  No  Homicidal Thoughts:  No  Memory:  WNL  Judgement:  Good  Insight:  Good  Psychomotor Activity:  Normal  Concentration:  Concentration: Good and Attention Span: Good  Recall:  Good  Fund of Knowledge: Good  Language: Good  Assets:  Communication Skills Desire for Improvement Financial Resources/Insurance Housing Resilience Transportation Vocational/Educational  ADL's:  Intact  Cognition: WNL  Prognosis:  Good   GeneSight results on chart, scanned under labs.  Reviewed labs from 05/22/2023 and 08/07/2023.  Creatinine borderline but this is stable for her.  ECT-MADRS    Flowsheet Row Office Visit from [facility]/04/2023 in [facility] Office Visit from 09/22/2021 in [facility] Office Visit from 08/22/2021 in [facility]  MADRS Total Score 40 4 36      GAD-7    Flowsheet Row Office Visit from 08/07/2023 in [facility] Office Visit from 07/03/2023 in [facility] Office Visit from 05/22/2023 in [facility] Office Visit from 05/05/2021 in [facility] Office Visit from 12/29/2020 in [facility]  Total GAD-7 Score 2 3 6 1 1       PHQ2-9    Flowsheet Row Office Visit from 08/07/2023 in [facility] Office Visit from 07/03/2023 in [facility] Office Visit from 05/22/2023 in [facility] Office Visit from 05/05/2021 in [facility] Office Visit from 12/29/2020 in [facility]  PHQ-2 Total Score 4 1 4 2 1    PHQ-9 Total Score 10 2 9 4 3       Flowsheet Row Admission (Discharged) from 03/11/2023 in [facility] ED from 05/30/2021 in [facility]  C-SSRS RISK CATEGORY No Risk No Risk      DIAGNOSES:    ICD-10-CM   1. Treatment-resistant depression  F32.9     2. Generalized anxiety disorder  F41.1     3. Insomnia, unspecified type  G47.00     4. Low vitamin D  level  R79.89 VITAMIN D  25 Hydroxy (Vit-D Deficiency, Fractures)    5. Encounter for long-term (current) use of medications  Z79.899 VITAMIN D  25 Hydroxy (Vit-D Deficiency, Fractures)      Receiving Psychotherapy: Yes     RECOMMENDATIONS:  PDMP was reviewed.  Xanax  filled 04/25/2023.  Ambien  filled 07/30/2022.   I provided 30 minutes of face to face time during this encounter, including time spent before and after the visit in records review, medical decision making, counseling pertinent to today's visit, and charting.   Discussed the Lithium .  If it was obviously helping her mood, I'd recommend staying on it and continuing to monitor kidney functions. They've been stable for awhile now. However, the Lithium  isn't 'doing anything' so we agree  it's best to come off it.   Continue Xanax  0.5 mg, 1 p.o. twice daily as needed. Continue Auvelity  45/105 mg, 1 p.o. twice daily.   Continue Latuda  40 mg w/ supper. Continue Viibryd  40 mg p.o. daily.   Continue Ambien  10 mg, 1/2-1 nightly as needed sleep. Continue vitamins as per med list.  Labs ordered as above.  Continue counseling. Return in 3-4 months.    Marvia Slocumb, PA-C

## 2023-09-13 ENCOUNTER — Other Ambulatory Visit (HOSPITAL_BASED_OUTPATIENT_CLINIC_OR_DEPARTMENT_OTHER): Payer: Self-pay

## 2023-09-27 ENCOUNTER — Ambulatory Visit: Admitting: Physician Assistant

## 2023-10-23 DIAGNOSIS — E349 Endocrine disorder, unspecified: Secondary | ICD-10-CM | POA: Diagnosis not present

## 2023-10-23 DIAGNOSIS — E663 Overweight: Secondary | ICD-10-CM | POA: Diagnosis not present

## 2023-10-23 DIAGNOSIS — E569 Vitamin deficiency, unspecified: Secondary | ICD-10-CM | POA: Diagnosis not present

## 2023-10-30 ENCOUNTER — Other Ambulatory Visit (HOSPITAL_COMMUNITY): Payer: Self-pay | Admitting: Nephrology

## 2023-10-30 DIAGNOSIS — G4733 Obstructive sleep apnea (adult) (pediatric): Secondary | ICD-10-CM | POA: Diagnosis not present

## 2023-10-30 DIAGNOSIS — E559 Vitamin D deficiency, unspecified: Secondary | ICD-10-CM | POA: Diagnosis not present

## 2023-10-30 DIAGNOSIS — Z87891 Personal history of nicotine dependence: Secondary | ICD-10-CM

## 2023-10-30 DIAGNOSIS — N1831 Chronic kidney disease, stage 3a: Secondary | ICD-10-CM | POA: Diagnosis not present

## 2023-10-30 DIAGNOSIS — K760 Fatty (change of) liver, not elsewhere classified: Secondary | ICD-10-CM | POA: Diagnosis not present

## 2023-11-11 ENCOUNTER — Ambulatory Visit: Payer: Self-pay

## 2023-11-11 NOTE — Telephone Encounter (Signed)
 FYI Only or Action Required?: FYI only for provider.  Patient was last seen in primary care on 08/07/2023 by Severa Rock HERO, FNP.  Called Nurse Triage reporting Leg Swelling.  Symptoms began several days ago.  Interventions attempted: Nothing.  Symptoms are: unchanged.  Triage Disposition: See PCP When Office is Open (Within 3 Days)  Patient/caregiver understands and will follow disposition?: Yes, will follow disposition  Copied from CRM [id number]. Topic: Clinical - Red Word Triage >> Nov 11, 2023  2:46 PM Tobias L wrote: Red Word that prompted transfer to Nurse Triage: swelling in legs, appt wednesday? Reason for Disposition  [1] MILD swelling of both ankles (i.e., pedal edema) AND [2] new-onset or getting worse  Answer Assessment - Initial Assessment Questions 1. ONSET: When did the swelling start? (e.g., minutes, hours, days)     About 4 days ago 2. LOCATION: What part of the leg is swollen?  Are both legs swollen or just one leg?     Bilateral, up to knee 3. SEVERITY: How bad is the swelling? (e.g., localized; mild, moderate, severe)     Mild/moderate 4. REDNESS: Is there redness or signs of infection?     denies 5. PAIN: Is the swelling painful to touch? If Yes, ask: How painful is it?   (Scale 1-10; mild, moderate or severe)     denies 6. FEVER: Do you have a fever? If Yes, ask: What is it, how was it measured, and when did it start?      denies 7. CAUSE: What do you think is causing the leg swelling?     unsure 8. MEDICAL HISTORY: Do you have a history of blood clots (e.g., DVT), cancer, heart failure, kidney disease, or liver failure?     denies 9. RECURRENT SYMPTOM: Have you had leg swelling before? If Yes, ask: When was the last time? What happened that time?     Had this in past when pregnant 10. OTHER SYMPTOMS: Do you have any other symptoms? (e.g., chest pain, difficulty breathing)       denies  Protocols used: Leg Swelling and  Edema-A-AH

## 2023-11-11 NOTE — Telephone Encounter (Signed)
 Noted. appt made.

## 2023-11-13 ENCOUNTER — Ambulatory Visit: Admitting: Family Medicine

## 2023-11-13 ENCOUNTER — Ambulatory Visit (INDEPENDENT_AMBULATORY_CARE_PROVIDER_SITE_OTHER)

## 2023-11-13 ENCOUNTER — Other Ambulatory Visit (HOSPITAL_BASED_OUTPATIENT_CLINIC_OR_DEPARTMENT_OTHER): Payer: Self-pay

## 2023-11-13 ENCOUNTER — Encounter: Payer: Self-pay | Admitting: Family Medicine

## 2023-11-13 ENCOUNTER — Ambulatory Visit: Payer: Self-pay | Admitting: Family Medicine

## 2023-11-13 VITALS — BP 119/77 | HR 87 | Temp 96.7°F | Ht 65.0 in | Wt 241.0 lb

## 2023-11-13 DIAGNOSIS — M7989 Other specified soft tissue disorders: Secondary | ICD-10-CM | POA: Diagnosis not present

## 2023-11-13 DIAGNOSIS — R0609 Other forms of dyspnea: Secondary | ICD-10-CM | POA: Diagnosis not present

## 2023-11-13 DIAGNOSIS — R609 Edema, unspecified: Secondary | ICD-10-CM

## 2023-11-13 MED ORDER — FUROSEMIDE 20 MG PO TABS
20.0000 mg | ORAL_TABLET | Freq: Every day | ORAL | 3 refills | Status: AC
  Filled 2023-11-13: qty 30, 30d supply, fill #0

## 2023-11-13 NOTE — Progress Notes (Signed)
 Subjective:  Patient ID: Aimee Hart, female    DOB: [birth year], 58 y.o.   MRN: [id number]  Patient Care Team: Severa Rock HERO, FNP as PCP - General (Family Medicine) Alvan Ronal BRAVO, MD (Inactive) as PCP - Cardiology (Cardiology) [facility], [facility]   Chief Complaint:  Edema (Bilateral hands, lower legs and ankles x 1 week )   HPI: Aimee Hart is a 58 y.o. female presenting on 11/13/2023 for Edema (Bilateral hands, lower legs and ankles x 1 week )   Aimee Hart is a 58 year old female who presents with swelling in her legs and feet.  She has been experiencing swelling in both legs and feet since last Wednesday, which began while she was on vacation. The swelling has been gradually improving each day. She attributes the onset to carrying items up and down stairs at a beach house.  She also experienced shortness of breath while going up and down stairs, which resolved upon stopping the activity. No chest pain, palpitations, or changes in urination pattern. She has not experienced any significant chest pain or fatigue prior to this episode.  She recently started Mounjaro  injections on Saturday, after the onset of her symptoms. No changes in her medication regimen or an increase in salt intake.  She has previously taken a diuretic from her sister for two days, which increased urination but did not noticeably reduce swelling.  She denies chest pain, palpitations, cough, waking up at night feeling breathless, or needing to prop herself up on more than two pillows.          Relevant past medical, surgical, family, and social history reviewed and updated as indicated.  Allergies and medications reviewed and updated. Data reviewed: Chart in Epic.   Past Medical History:  Diagnosis Date   Cancer (HCC)    melanoma, back   Depression    GERD (gastroesophageal reflux disease)    Hypercholesteremia    Knee pain    Lower back pain    OSA (obstructive  sleep apnea)    Sleep apnea     Past Surgical History:  Procedure Laterality Date   COLONOSCOPY     LAPAROSCOPIC ROUX-EN-Y GASTRIC BYPASS WITH UPPER ENDOSCOPY AND REMOVAL OF LAP BAND  2008   OTHER SURGICAL HISTORY     Lap band removal 03/11/23   TONSILLECTOMY  1980    Social History   Socioeconomic History   Marital status: Divorced    Spouse name: Not on file   Number of children: 2   Years of education: college   Highest education level: Not on file  Occupational History    Comment: RN  Tobacco Use   Smoking status: Former    Current packs/day: 0.00    Average packs/day: 1 pack/day for 10.0 years (10.0 ttl pk-yrs)    Types: Cigarettes    Start date: 02/06/1986    Quit date: 02/07/1996    Years since quitting: 27.7    Passive exposure: Past   Smokeless tobacco: Never  Vaping Use   Vaping status: Never Used  Substance and Sexual Activity   Alcohol use: Yes    Comment: 1/week   Drug use: Never   Sexual activity: Yes  Other Topics Concern   Not on file  Social History Narrative   Lives with son   Caffeine 16 oz daily   Social Drivers of Corporate investment banker Strain: Not on file  Food Insecurity: No Food Insecurity (  05/22/2023)   Hunger Vital Sign    Worried About Running Out of Food in the Last Year: Never true    Ran Out of Food in the Last Year: Never true  Transportation Needs: No Transportation Needs (05/22/2023)   PRAPARE - Administrator, Civil Service (Medical): No    Lack of Transportation (Non-Medical): No  Physical Activity: Not on file  Stress: No Stress Concern Present (05/22/2023)   [employer] of Occupational Health - Occupational Stress Questionnaire    Feeling of Stress : Not at all  Social Connections: Moderately Isolated (05/22/2023)   Social Connection and Isolation Panel    Frequency of Communication with Friends and Family: More than three times a week    Frequency of Social Gatherings with Friends and Family: Once  a week    Attends Religious Services: Never    Database administrator or Organizations: Yes    Attends Banker Meetings: 1 to 4 times per year    Marital Status: Divorced  Intimate Partner Violence: Not At Risk (05/22/2023)   Humiliation, Afraid, Rape, and Kick questionnaire    Fear of Current or Ex-Partner: No    Emotionally Abused: No    Physically Abused: No    Sexually Abused: No    Outpatient Encounter Medications as of 11/13/2023  Medication Sig   ALPRAZolam  (XANAX ) 0.5 MG tablet Take 1 tablet (0.5 mg total) by mouth 2 (two) times daily as needed for anxiety.   Dextromethorphan -buPROPion  ER (AUVELITY ) 45-105 MG TBCR Take 1 tablet by mouth 2 (two) times daily.   estradiol  (VIVELLE -DOT) 0.1 MG/24HR patch Place 1 patch (0.1 mg total) onto the skin 2 (two) times a week.   furosemide  (LASIX ) 20 MG tablet Take 1 tablet (20 mg total) by mouth daily.   lurasidone  (LATUDA ) 40 MG TABS tablet Take 1 tablet (40 mg total) by mouth daily with supper.   NON FORMULARY Pt uses a c-pap nightly   progesterone  (PROMETRIUM ) 100 MG capsule Take 1 capsule every day by oral route at bedtime.   progesterone  (PROMETRIUM ) 100 MG capsule Take 1 capsule (100 mg total) by mouth at bedtime.   Vilazodone  HCl (VIIBRYD ) 40 MG TABS Take 1 tablet by mouth daily.   Vitamin D , Ergocalciferol , (DRISDOL ) 1.25 MG (50000 UNIT) CAPS capsule Take 1 capsule (50,000 Units total) by mouth every 7 (seven) days.   zolpidem  (AMBIEN ) 10 MG tablet Take 1/2 - 1 tablet by mouth at bedtime as needed for sleep.   metroNIDAZOLE  (METROCREAM ) 0.75 % cream Apply as directed to affected area twice a day; Use of SPF is recommended. (Patient not taking: Reported on 11/13/2023)   No facility-administered encounter medications on file as of 11/13/2023.    No Known Allergies  Pertinent ROS per HPI, otherwise unremarkable      Objective:  BP 119/77   Pulse 87   Temp (!) 96.7 F (35.9 C)   Ht 5' 5 (1.651 m)   Wt 241 lb  (109.3 kg)   SpO2 94%   BMI 40.10 kg/m    Wt Readings from Last 3 Encounters:  11/13/23 241 lb (109.3 kg)  08/07/23 233 lb 12.8 oz (106.1 kg)  07/03/23 229 lb 3.2 oz (104 kg)    Physical Exam Vitals and nursing note reviewed.  Constitutional:      General: She is not in acute distress.    Appearance: Normal appearance. She is well-developed and well-groomed. She is obese. She is not ill-appearing, toxic-appearing or  diaphoretic.  HENT:     Head: Normocephalic and atraumatic.     Jaw: There is normal jaw occlusion.     Right Ear: Hearing normal.     Left Ear: Hearing normal.     Nose: Nose normal.     Mouth/Throat:     Lips: Pink.     Mouth: Mucous membranes are moist.     Pharynx: Oropharynx is clear. Uvula midline.  Eyes:     General: Lids are normal.     Extraocular Movements: Extraocular movements intact.     Conjunctiva/sclera: Conjunctivae normal.     Pupils: Pupils are equal, round, and reactive to light.  Neck:     Thyroid : No thyroid  mass, thyromegaly or thyroid  tenderness.     Vascular: No carotid bruit or JVD.     Trachea: Trachea and phonation normal.  Cardiovascular:     Rate and Rhythm: Normal rate and regular rhythm.     Chest Wall: PMI is not displaced.     Pulses: Normal pulses.     Heart sounds: Normal heart sounds. No murmur heard.    No friction rub. No gallop.  Pulmonary:     Effort: Pulmonary effort is normal. No respiratory distress.     Breath sounds: Normal breath sounds. No wheezing.  Abdominal:     General: Bowel sounds are normal. There is no distension or abdominal bruit.     Palpations: Abdomen is soft. There is no hepatomegaly or splenomegaly.     Tenderness: There is no abdominal tenderness. There is no right CVA tenderness or left CVA tenderness.     Hernia: No hernia is present.  Musculoskeletal:        General: Normal range of motion.     Cervical back: Normal range of motion and neck supple.     Right lower leg: 1+ Edema (to mid  shin) present.     Left lower leg: 1+ Edema (to mid shin) present.  Lymphadenopathy:     Cervical: No cervical adenopathy.  Skin:    General: Skin is warm and dry.     Capillary Refill: Capillary refill takes less than 2 seconds.     Coloration: Skin is not cyanotic, jaundiced or pale.     Findings: No rash.  Neurological:     General: No focal deficit present.     Mental Status: She is alert and oriented to person, place, and time.     Sensory: Sensation is intact.     Motor: Motor function is intact.     Coordination: Coordination is intact.     Gait: Gait is intact.     Deep Tendon Reflexes: Reflexes are normal and symmetric.  Psychiatric:        Attention and Perception: Attention and perception normal.        Mood and Affect: Mood and affect normal.        Speech: Speech normal.        Behavior: Behavior normal. Behavior is cooperative.        Thought Content: Thought content normal.        Cognition and Memory: Cognition and memory normal.        Judgment: Judgment normal.     Results for orders placed or performed in visit on 08/07/23  Amylase   Collection Time: 08/07/23 10:32 AM  Result Value Ref Range   Amylase 92 31 - 110 U/L  Lipase   Collection Time: 08/07/23 10:32 AM  Result Value Ref  Range   Lipase 37 14 - 72 U/L  CMP14+EGFR   Collection Time: 08/07/23 10:32 AM  Result Value Ref Range   Glucose 103 (H) 70 - 99 mg/dL   BUN 16 6 - 24 mg/dL   Creatinine, Ser 8.89 (H) 0.57 - 1.00 mg/dL   eGFR 58 (L) >40 fO/fpw/8.26   BUN/Creatinine Ratio 15 9 - 23   Sodium 139 134 - 144 mmol/L   Potassium 4.6 3.5 - 5.2 mmol/L   Chloride 105 96 - 106 mmol/L   CO2 22 20 - 29 mmol/L   Calcium 9.0 8.7 - 10.2 mg/dL   Total Protein 6.4 6.0 - 8.5 g/dL   Albumin 4.2 3.8 - 4.9 g/dL   Globulin, Total 2.2 1.5 - 4.5 g/dL   Bilirubin Total 0.3 0.0 - 1.2 mg/dL   Alkaline Phosphatase 77 44 - 121 IU/L   AST 16 0 - 40 IU/L   ALT 9 0 - 32 IU/L  CBC with Differential/Platelet    Collection Time: 08/07/23 10:32 AM  Result Value Ref Range   WBC 6.9 3.4 - 10.8 x10E3/uL   RBC 4.82 3.77 - 5.28 x10E6/uL   Hemoglobin 14.4 11.1 - 15.9 g/dL   Hematocrit 57.5 65.9 - 46.6 %   MCV 88 79 - 97 fL   MCH 29.9 26.6 - 33.0 pg   MCHC 34.0 31.5 - 35.7 g/dL   RDW 86.2 88.2 - 84.5 %   Platelets 261 150 - 450 x10E3/uL   Neutrophils 50 Not Estab. %   Lymphs 22 Not Estab. %   Monocytes 7 Not Estab. %   Eos 19 Not Estab. %   Basos 2 Not Estab. %   Neutrophils Absolute 3.5 1.4 - 7.0 x10E3/uL   Lymphocytes Absolute 1.5 0.7 - 3.1 x10E3/uL   Monocytes Absolute 0.5 0.1 - 0.9 x10E3/uL   EOS (ABSOLUTE) 1.3 (H) 0.0 - 0.4 x10E3/uL   Basophils Absolute 0.1 0.0 - 0.2 x10E3/uL   Immature Granulocytes 0 Not Estab. %   Immature Grans (Abs) 0.0 0.0 - 0.1 x10E3/uL     EKG: SR, 76, PR 152 ms, QT 370 ms, no acute ST-T changes, no ectopy. Incomplete BBB, pulmonary pattern.   Pertinent labs & imaging results that were available during my care of the patient were reviewed by me and considered in my medical decision making.  Assessment & Plan:  [place] was seen today for edema.  Diagnoses and all orders for this visit:  Pitting edema -     Brain natriuretic peptide -     CMP14+EGFR -     CBC with Differential/Platelet -     DG Chest 2 View; Future -     furosemide  (LASIX ) 20 MG tablet; Take 1 tablet (20 mg total) by mouth daily. -     EKG 12-Lead -     ECHOCARDIOGRAM COMPLETE; Future  DOE (dyspnea on exertion) -     Brain natriuretic peptide -     CMP14+EGFR -     CBC with Differential/Platelet -     DG Chest 2 View; Future -     furosemide  (LASIX ) 20 MG tablet; Take 1 tablet (20 mg total) by mouth daily. -     EKG 12-Lead -     ECHOCARDIOGRAM COMPLETE; Future     Peripheral Edema Bilateral leg and foot swelling since last Wednesday, likely related to physical exertion during vacation. Swelling has improved but persists. No significant changes in medication or salt intake. No associated  chest  pain, palpitations, or significant dyspnea at rest.  - Order lab work to assess kidney and liver function - Order EKG, echo, and chest x-ray to evaluate heart function and rule out new onset heart failure - Order BMP to check fluid level status - Prescribe Lasix  20 mg daily for one week - Instruct her to monitor symptoms and report any worsening or new symptoms immediately  Dyspnea on Exertion Shortness of breath while ascending and descending stairs, resolving with rest. No associated chest pain, palpitations, or nocturnal dyspnea. No need for additional pillows at night. Likely related to physical exertion and possibly linked to peripheral edema. Further evaluation with diagnostic tests to rule out cardiac or pulmonary causes. - Evaluate with EKG, echo, and chest x-ray as part of peripheral edema workup  Follow-up Reassessment of symptoms and evaluation of test results needed. Monitoring response to diuretic therapy and ensuring no adverse effects. - Schedule follow-up appointment for next week - Review EKG results and inform her of any abnormalities - Ensure she receives chest x-ray and lab work today.       Total time spent with patient today was 40 minutes, this time was spent reviewing prior charts, labs, x-rays, discussing plan of care, and documenting the encounter.    Continue all other maintenance medications.  Follow up plan: Return in about 1 week (around 11/20/2023), or if symptoms worsen or fail to improve, for edema.   Continue healthy lifestyle choices, including diet (rich in fruits, vegetables, and lean proteins, and low in salt and simple carbohydrates) and exercise (at least 30 minutes of moderate physical activity daily).  Educational handout given for edema  The above assessment and management plan was discussed with the patient. The patient verbalized understanding of and has agreed to the management plan. Patient is aware to call the clinic if they develop  any new symptoms or if symptoms persist or worsen. Patient is aware when to return to the clinic for a follow-up visit. Patient educated on when it is appropriate to go to the emergency department.   Rosaline Bruns, FNP-C [facility] [phone]

## 2023-11-14 ENCOUNTER — Telehealth: Payer: Self-pay

## 2023-11-14 ENCOUNTER — Encounter: Payer: Self-pay | Admitting: Family Medicine

## 2023-11-14 LAB — CMP14+EGFR
ALT: 13 IU/L (ref 0–32)
AST: 18 IU/L (ref 0–40)
Albumin: 4.3 g/dL (ref 3.8–4.9)
Alkaline Phosphatase: 69 IU/L (ref 44–121)
BUN/Creatinine Ratio: 16 (ref 9–23)
BUN: 18 mg/dL (ref 6–24)
Bilirubin Total: 0.4 mg/dL (ref 0.0–1.2)
CO2: 21 mmol/L (ref 20–29)
Calcium: 9.3 mg/dL (ref 8.7–10.2)
Chloride: 104 mmol/L (ref 96–106)
Creatinine, Ser: 1.15 mg/dL — ABNORMAL HIGH (ref 0.57–1.00)
Globulin, Total: 2.2 g/dL (ref 1.5–4.5)
Glucose: 92 mg/dL (ref 70–99)
Potassium: 4.8 mmol/L (ref 3.5–5.2)
Sodium: 141 mmol/L (ref 134–144)
Total Protein: 6.5 g/dL (ref 6.0–8.5)
eGFR: 55 mL/min/1.73 — ABNORMAL LOW (ref >59)

## 2023-11-14 LAB — CBC WITH DIFFERENTIAL/PLATELET
Basophils Absolute: 0.1 x10E3/uL (ref 0.0–0.2)
Basos: 1 %
EOS (ABSOLUTE): 0.2 x10E3/uL (ref 0.0–0.4)
Eos: 4 %
Hematocrit: 43 % (ref 34.0–46.6)
Hemoglobin: 14.3 g/dL (ref 11.1–15.9)
Immature Grans (Abs): 0 x10E3/uL (ref 0.0–0.1)
Immature Granulocytes: 0 %
Lymphocytes Absolute: 1.5 x10E3/uL (ref 0.7–3.1)
Lymphs: 29 %
MCH: 29.8 pg (ref 26.6–33.0)
MCHC: 33.3 g/dL (ref 31.5–35.7)
MCV: 90 fL (ref 79–97)
Monocytes Absolute: 0.4 x10E3/uL (ref 0.1–0.9)
Monocytes: 9 %
Neutrophils Absolute: 2.9 x10E3/uL (ref 1.4–7.0)
Neutrophils: 57 %
Platelets: 272 x10E3/uL (ref 150–450)
RBC: 4.8 x10E6/uL (ref 3.77–5.28)
RDW: 13.1 % (ref 11.7–15.4)
WBC: 5.2 x10E3/uL (ref 3.4–10.8)

## 2023-11-14 LAB — BRAIN NATRIURETIC PEPTIDE: BNP: 10.7 pg/mL (ref 0.0–100.0)

## 2023-11-14 NOTE — Telephone Encounter (Signed)
 error

## 2023-11-20 ENCOUNTER — Ambulatory Visit

## 2023-11-27 ENCOUNTER — Encounter: Payer: Self-pay | Admitting: Physician Assistant

## 2023-11-27 ENCOUNTER — Ambulatory Visit (INDEPENDENT_AMBULATORY_CARE_PROVIDER_SITE_OTHER): Admitting: Physician Assistant

## 2023-11-27 DIAGNOSIS — G4700 Insomnia, unspecified: Secondary | ICD-10-CM | POA: Diagnosis not present

## 2023-11-27 DIAGNOSIS — F329 Major depressive disorder, single episode, unspecified: Secondary | ICD-10-CM | POA: Diagnosis not present

## 2023-11-27 DIAGNOSIS — F411 Generalized anxiety disorder: Secondary | ICD-10-CM

## 2023-11-27 NOTE — Progress Notes (Signed)
 [facility]  Patient ID: Aimee Hart,  MRN: [id number]  PCP: Aimee Rock HERO, FNP  Date of Evaluation: 11/27/2023 Time spent:25 minutes  Chief Complaint:  Chief Complaint   Depression; Anxiety; Insomnia; Follow-up    HISTORY/CURRENT STATUS: HPI For routine 2 mo med check.  Aimee Hart is doing well.  Feels like this combination of meds is effective.  She is able to enjoy things. Goes hiking with her boyfriend sometimes.  Energy and motivation are good.  Work is going well.   No extreme sadness, tearfulness, or feelings of hopelessness.  Sleeps well most of the time. ADLs and personal hygiene are normal.   Denies any changes in concentration, making decisions, or remembering things.  Appetite has not changed.  Anxiety is controlled.  No mania, delirium, AH/VH.  No SI/HI.  Individual Medical History/ Review of Systems: Changes? :Yes   had LE edema a few weeks ago. Better now.  Past medications for mental health diagnoses include: Zoloft, Prozac, Paxil, Abilify, Effexor, Celexa, Cymbalta, Lamictal, Ambien , melatonin, Wellbutrin  SR, Trintellix , Viibryd , Cerefolin NAC (not sure if effective at all plus was expensive) Auvelity   Allergies: Patient has no known allergies.  Current Medications:  Current Outpatient Medications:    ALPRAZolam  (XANAX ) 0.5 MG tablet, Take 1 tablet (0.5 mg total) by mouth 2 (two) times daily as needed for anxiety., Disp: 30 tablet, Rfl: 5   Dextromethorphan -buPROPion  ER (AUVELITY ) 45-105 MG TBCR, Take 1 tablet by mouth 2 (two) times daily., Disp: 180 tablet, Rfl: 3   estradiol  (VIVELLE -DOT) 0.1 MG/24HR patch, Place 1 patch (0.1 mg total) onto the skin 2 (two) times a week., Disp: 24 patch, Rfl: 1   furosemide  (LASIX ) 20 MG tablet, Take 1 tablet (20 mg total) by mouth daily., Disp: 30 tablet, Rfl: 3   lurasidone  (LATUDA ) 40 MG TABS tablet, Take 1 tablet (40 mg total) by mouth daily with supper., Disp: 90 tablet, Rfl: 1   progesterone  (PROMETRIUM ) 100 MG  capsule, Take 1 capsule every day by oral route at bedtime., Disp: 30 capsule, Rfl: 11   progesterone  (PROMETRIUM ) 100 MG capsule, Take 1 capsule (100 mg total) by mouth at bedtime., Disp: 90 capsule, Rfl: 2   Vilazodone  HCl (VIIBRYD ) 40 MG TABS, Take 1 tablet by mouth daily., Disp: 90 tablet, Rfl: 3   Vitamin D , Ergocalciferol , (DRISDOL ) 1.25 MG (50000 UNIT) CAPS capsule, Take 1 capsule (50,000 Units total) by mouth every 7 (seven) days., Disp: 12 capsule, Rfl: 3   zolpidem  (AMBIEN ) 10 MG tablet, Take 1/2 - 1 tablet by mouth at bedtime as needed for sleep., Disp: 30 tablet, Rfl: 4   metroNIDAZOLE  (METROCREAM ) 0.75 % cream, Apply as directed to affected area twice a day; Use of SPF is recommended. (Patient not taking: Reported on 11/13/2023), Disp: 45 g, Rfl: 1   metroNIDAZOLE  (METROCREAM ) 0.75 % cream, Apply topically to the affected area 2 (two) times daily. use SPF, Disp: 45 g, Rfl: 1   NON FORMULARY, Pt uses a c-pap nightly, Disp: , Rfl:  Medication Side Effects: none  Family Medical/ Social History: Changes?  No   MENTAL HEALTH EXAM:  There were no vitals taken for this visit.There is no height or weight on file to calculate BMI.  General Appearance: Casual, Neat and Well Groomed  Eye Contact:  Good  Speech:  Clear and Coherent and Normal Rate  Volume:  Normal  Mood:  Euthymic  Affect:  Congruent  Thought Process:  Goal Directed and Descriptions of Associations: Circumstantial  Orientation:  Full (Time, Place, and Person)  Thought Content: Logical   Suicidal Thoughts:  No  Homicidal Thoughts:  No  Memory:  WNL  Judgement:  Good  Insight:  Good  Psychomotor Activity:  Normal  Concentration:  Concentration: Good and Attention Span: Good  Recall:  Good  Fund of Knowledge: Good  Language: Good  Assets:  Communication Skills Desire for Improvement Financial Resources/Insurance Housing Resilience Social Support Transportation Vocational/Educational  ADL's:  Intact  Cognition:  WNL  Prognosis:  Good   GeneSight results on chart, scanned under labs.  Reviewed labs from 11-13-2023.  ECT-MADRS    Flowsheet Row Office Visit from 02/04/2023 in [facility] Office Visit from 09/22/2021 in [facility] Office Visit from 08/22/2021 in [facility]  MADRS Total Score 40 4 36   GAD-7    Flowsheet Row Office Visit from 08/07/2023 in [facility] Office Visit from 07/03/2023 in [facility] Office Visit from 05/22/2023 in [facility] Office Visit from 05/05/2021 in [facility] Office Visit from 12/29/2020 in [facility]  Total GAD-7 Score 2 3 6 1 1    PHQ2-9    Flowsheet Row Office Visit from 08/07/2023 in [facility] Office Visit from 07/03/2023 in [facility] Office Visit from 05/22/2023 in [facility] Office Visit from 05/05/2021 in [facility] Office Visit from 12/29/2020 in [facility]  PHQ-2 Total Score 4 1 4 2 1   PHQ-9 Total Score 10 2 9 4 3    Flowsheet Row Admission (Discharged) from 03/11/2023 in [place] [facility] ED from 05/30/2021 in [facility] Emergency Department at [facility]  C-SSRS RISK CATEGORY No Risk No Risk   DIAGNOSES:    ICD-10-CM   1. Treatment-resistant depression  F32.9     2. Generalized anxiety disorder  F41.1     3. Insomnia, unspecified type  G47.00      Receiving Psychotherapy: Yes     RECOMMENDATIONS:  PDMP was reviewed.  Xanax  filled 09/11/2023.  Ambien  filled 07/30/2022.   I provided approximately 25  minutes of face to face time during this encounter, including time spent before and after the visit in  records review, medical decision making, counseling pertinent to today's visit, and charting.   She is doing well on her current meds so no changes need to be made.   Continue Xanax  0.5 mg, 1 p.o. twice daily as needed. Continue Auvelity  45/105 mg, 1 p.o. twice daily.   Continue Latuda  40 mg w/ supper. Continue Viibryd  40 mg p.o. daily.   Continue Ambien  10 mg, 1/2-1 nightly as needed sleep. Continue vitamins as per med list.  Continue counseling. Return in 5 months.    Verneita Cooks, PA-C

## 2023-11-28 ENCOUNTER — Other Ambulatory Visit (HOSPITAL_BASED_OUTPATIENT_CLINIC_OR_DEPARTMENT_OTHER): Payer: Self-pay

## 2023-11-28 DIAGNOSIS — L570 Actinic keratosis: Secondary | ICD-10-CM | POA: Diagnosis not present

## 2023-11-28 DIAGNOSIS — L918 Other hypertrophic disorders of the skin: Secondary | ICD-10-CM | POA: Diagnosis not present

## 2023-11-28 DIAGNOSIS — D485 Neoplasm of uncertain behavior of skin: Secondary | ICD-10-CM | POA: Diagnosis not present

## 2023-11-28 DIAGNOSIS — Z85820 Personal history of malignant melanoma of skin: Secondary | ICD-10-CM | POA: Diagnosis not present

## 2023-11-28 DIAGNOSIS — L718 Other rosacea: Secondary | ICD-10-CM | POA: Diagnosis not present

## 2023-11-28 DIAGNOSIS — L821 Other seborrheic keratosis: Secondary | ICD-10-CM | POA: Diagnosis not present

## 2023-11-28 DIAGNOSIS — D2261 Melanocytic nevi of right upper limb, including shoulder: Secondary | ICD-10-CM | POA: Diagnosis not present

## 2023-11-28 MED ORDER — METRONIDAZOLE 0.75 % EX CREA
TOPICAL_CREAM | Freq: Two times a day (BID) | CUTANEOUS | 1 refills | Status: AC
  Filled 2023-11-28: qty 45, 30d supply, fill #0

## 2023-12-04 ENCOUNTER — Ambulatory Visit (HOSPITAL_COMMUNITY): Admission: RE | Admit: 2023-12-04 | Source: Ambulatory Visit

## 2023-12-04 DIAGNOSIS — G4733 Obstructive sleep apnea (adult) (pediatric): Secondary | ICD-10-CM | POA: Diagnosis not present

## 2023-12-11 ENCOUNTER — Ambulatory Visit: Admitting: Physician Assistant

## 2023-12-18 ENCOUNTER — Ambulatory Visit (HOSPITAL_COMMUNITY)
Admission: RE | Admit: 2023-12-18 | Discharge: 2023-12-18 | Disposition: A | Discharge status: Home / Self Care | Source: Ambulatory Visit | Attending: Family Medicine | Admitting: Family Medicine

## 2023-12-18 DIAGNOSIS — R0609 Other forms of dyspnea: Secondary | ICD-10-CM | POA: Diagnosis not present

## 2023-12-18 DIAGNOSIS — R609 Edema, unspecified: Secondary | ICD-10-CM | POA: Diagnosis not present

## 2023-12-18 DIAGNOSIS — G4730 Sleep apnea, unspecified: Secondary | ICD-10-CM | POA: Insufficient documentation

## 2023-12-19 LAB — ECHOCARDIOGRAM COMPLETE
AR max vel: 2.66 cm2
AV Area VTI: 3.05 cm2
AV Area mean vel: 3.17 cm2
AV Mean grad: 2 mmHg
AV Peak grad: 4.8 mmHg
Ao pk vel: 1.1 m/s
Area-P 1/2: 3.43 cm2
S' Lateral: 3.3 cm

## 2023-12-29 DIAGNOSIS — R079 Chest pain, unspecified: Secondary | ICD-10-CM | POA: Diagnosis not present

## 2023-12-29 DIAGNOSIS — R0789 Other chest pain: Secondary | ICD-10-CM | POA: Diagnosis not present

## 2024-01-08 ENCOUNTER — Other Ambulatory Visit (HOSPITAL_BASED_OUTPATIENT_CLINIC_OR_DEPARTMENT_OTHER): Payer: Self-pay

## 2024-01-08 DIAGNOSIS — D2261 Melanocytic nevi of right upper limb, including shoulder: Secondary | ICD-10-CM | POA: Diagnosis not present

## 2024-01-08 MED ORDER — MUPIROCIN 2 % EX OINT
TOPICAL_OINTMENT | CUTANEOUS | 0 refills | Status: AC
  Filled 2024-01-08: qty 22, 30d supply, fill #0

## 2024-01-22 ENCOUNTER — Ambulatory Visit (HOSPITAL_COMMUNITY): Attending: Nephrology

## 2024-01-22 ENCOUNTER — Encounter (HOSPITAL_COMMUNITY): Payer: Self-pay

## 2024-01-29 ENCOUNTER — Other Ambulatory Visit: Payer: Self-pay

## 2024-02-13 ENCOUNTER — Other Ambulatory Visit (HOSPITAL_BASED_OUTPATIENT_CLINIC_OR_DEPARTMENT_OTHER): Payer: Self-pay

## 2024-02-13 MED ORDER — ONDANSETRON 8 MG PO TBDP
8.0000 mg | ORAL_TABLET | ORAL | 1 refills | Status: AC | PRN
  Filled 2024-02-13: qty 12, 4d supply, fill #0

## 2024-02-24 ENCOUNTER — Encounter: Payer: Self-pay | Admitting: Radiology

## 2024-02-24 ENCOUNTER — Other Ambulatory Visit (HOSPITAL_BASED_OUTPATIENT_CLINIC_OR_DEPARTMENT_OTHER): Payer: Self-pay

## 2024-04-01 ENCOUNTER — Other Ambulatory Visit: Payer: Self-pay | Admitting: Physician Assistant

## 2024-04-03 ENCOUNTER — Other Ambulatory Visit (HOSPITAL_BASED_OUTPATIENT_CLINIC_OR_DEPARTMENT_OTHER): Payer: Self-pay

## 2024-04-03 MED ORDER — LURASIDONE HCL 40 MG PO TABS
40.0000 mg | ORAL_TABLET | Freq: Every day | ORAL | 0 refills | Status: DC
  Filled 2024-04-03: qty 30, 30d supply, fill #0

## 2024-04-03 MED ORDER — VITAMIN D (ERGOCALCIFEROL) 1.25 MG (50000 UNIT) PO CAPS
50000.0000 [IU] | ORAL_CAPSULE | ORAL | 0 refills | Status: DC
  Filled 2024-04-03: qty 4, 28d supply, fill #0

## 2024-04-08 ENCOUNTER — Ambulatory Visit (HOSPITAL_COMMUNITY): Admission: RE | Admit: 2024-04-08 | Discharge: 2024-04-08 | Attending: Nephrology | Admitting: Nephrology

## 2024-04-08 DIAGNOSIS — K760 Fatty (change of) liver, not elsewhere classified: Secondary | ICD-10-CM | POA: Insufficient documentation

## 2024-04-08 DIAGNOSIS — N281 Cyst of kidney, acquired: Secondary | ICD-10-CM | POA: Diagnosis not present

## 2024-04-08 DIAGNOSIS — N1831 Chronic kidney disease, stage 3a: Secondary | ICD-10-CM | POA: Diagnosis not present

## 2024-04-08 DIAGNOSIS — N200 Calculus of kidney: Secondary | ICD-10-CM | POA: Diagnosis not present

## 2024-04-08 DIAGNOSIS — Z87891 Personal history of nicotine dependence: Secondary | ICD-10-CM | POA: Diagnosis not present

## 2024-04-08 DIAGNOSIS — G4733 Obstructive sleep apnea (adult) (pediatric): Secondary | ICD-10-CM | POA: Diagnosis not present

## 2024-04-29 ENCOUNTER — Other Ambulatory Visit (HOSPITAL_BASED_OUTPATIENT_CLINIC_OR_DEPARTMENT_OTHER): Payer: Self-pay

## 2024-04-29 ENCOUNTER — Ambulatory Visit: Admitting: Physician Assistant

## 2024-04-29 ENCOUNTER — Encounter: Payer: Self-pay | Admitting: Physician Assistant

## 2024-04-29 ENCOUNTER — Other Ambulatory Visit: Payer: Self-pay

## 2024-04-29 ENCOUNTER — Other Ambulatory Visit: Payer: Self-pay | Admitting: Physician Assistant

## 2024-04-29 DIAGNOSIS — F411 Generalized anxiety disorder: Secondary | ICD-10-CM | POA: Diagnosis not present

## 2024-04-29 DIAGNOSIS — G4700 Insomnia, unspecified: Secondary | ICD-10-CM

## 2024-04-29 DIAGNOSIS — R5381 Other malaise: Secondary | ICD-10-CM | POA: Diagnosis not present

## 2024-04-29 DIAGNOSIS — F329 Major depressive disorder, single episode, unspecified: Secondary | ICD-10-CM | POA: Diagnosis not present

## 2024-04-29 DIAGNOSIS — R5383 Other fatigue: Secondary | ICD-10-CM | POA: Diagnosis not present

## 2024-04-29 MED ORDER — VITAMIN D (ERGOCALCIFEROL) 1.25 MG (50000 UNIT) PO CAPS
50000.0000 [IU] | ORAL_CAPSULE | ORAL | 0 refills | Status: AC
  Filled 2024-04-29: qty 4, 28d supply, fill #0

## 2024-04-29 MED ORDER — ALPRAZOLAM 0.5 MG PO TABS
0.5000 mg | ORAL_TABLET | Freq: Two times a day (BID) | ORAL | 5 refills | Status: AC | PRN
  Filled 2024-04-29: qty 30, 15d supply, fill #0

## 2024-04-29 MED ORDER — PROGESTERONE MICRONIZED 100 MG PO CAPS
100.0000 mg | ORAL_CAPSULE | Freq: Every day | ORAL | 0 refills | Status: AC
  Filled 2024-04-29: qty 90, 90d supply, fill #0

## 2024-04-29 MED ORDER — LURASIDONE HCL 60 MG PO TABS
60.0000 mg | ORAL_TABLET | Freq: Every day | ORAL | 1 refills | Status: AC
  Filled 2024-04-29: qty 30, 30d supply, fill #0

## 2024-04-29 MED ORDER — ZOLPIDEM TARTRATE 10 MG PO TABS
5.0000 mg | ORAL_TABLET | Freq: Every evening | ORAL | 5 refills | Status: AC | PRN
  Filled 2024-04-29: qty 30, 30d supply, fill #0

## 2024-04-29 MED ORDER — ESTRADIOL 0.1 MG/24HR TD PTTW
1.0000 | MEDICATED_PATCH | TRANSDERMAL | 0 refills | Status: AC
  Filled 2024-04-29: qty 24, 84d supply, fill #0

## 2024-04-29 NOTE — Progress Notes (Signed)
 "     [facility]  Patient ID: Aimee Hart,  MRN: [id number]  PCP: Severa Rock HERO, FNP  Date of Evaluation: 04/29/2024 Time spent:20 minutes  Chief Complaint:  Chief Complaint   Anxiety; Depression; Insomnia; Follow-up    HISTORY/CURRENT STATUS: HPI For routine med check.  Not excited about anything. Has a cruise coming up and doesn't care if she goes or not. Lays around when she has time off.  Appetite is nl. ADLs and hygiene nl. Has been more anxious, needing to take 1/2 a Xanax  before work most mornings.  No having PA but just overwhelmed. She's tried counseling in the past but it wasn't a good fit. Sleeps ok.  Depression always gets worse in the winter.  Needs Ambien  sometimes.  No manic sx.  No SI/HI.  Individual Medical History/ Review of Systems: Changes? :No     Past medications for mental health diagnoses include: Zoloft, Prozac, Paxil, Abilify, Effexor, Celexa, Cymbalta, Lamictal, Ambien , melatonin, Wellbutrin  SR, Trintellix , Viibryd , Cerefolin NAC (not sure if effective at all plus was expensive) Auvelity  Mood therapy lamp years ago, wasn't helpful  Allergies: Patient has no known allergies.  Current Medications:  Current Outpatient Medications:    Dextromethorphan -buPROPion  ER (AUVELITY ) 45-105 MG TBCR, Take 1 tablet by mouth 2 (two) times daily., Disp: 180 tablet, Rfl: 3   estradiol  (VIVELLE -DOT) 0.1 MG/24HR patch, Place 1 patch (0.1 mg total) onto the skin 2 (two) times a week., Disp: 24 patch, Rfl: 1   Lurasidone  HCl 60 MG TABS, Take 1 tablet (60 mg total) by mouth daily with supper., Disp: 30 tablet, Rfl: 1   metroNIDAZOLE  (METROCREAM ) 0.75 % cream, Apply topically to the affected area 2 (two) times daily. use SPF, Disp: 45 g, Rfl: 1   mupirocin  ointment (BACTROBAN ) 2 %, Apply a small amount to affected area(s) once a day with bandage changes, Disp: 22 g, Rfl: 0   progesterone  (PROMETRIUM ) 100 MG capsule, Take 1 capsule every day by oral route at bedtime.,  Disp: 30 capsule, Rfl: 11   Vilazodone  HCl (VIIBRYD ) 40 MG TABS, Take 1 tablet by mouth daily., Disp: 90 tablet, Rfl: 3   Vitamin D , Ergocalciferol , (DRISDOL ) 1.25 MG (50000 UNIT) CAPS capsule, Take 1 capsule (50,000 Units total) by mouth every 7 (seven) days., Disp: 4 capsule, Rfl: 0   ALPRAZolam  (XANAX ) 0.5 MG tablet, Take 1 tablet (0.5 mg total) by mouth 2 (two) times daily as needed for anxiety., Disp: 30 tablet, Rfl: 5   furosemide  (LASIX ) 20 MG tablet, Take 1 tablet (20 mg total) by mouth daily. (Patient not taking: Reported on 04/29/2024), Disp: 30 tablet, Rfl: 3   metroNIDAZOLE  (METROCREAM ) 0.75 % cream, Apply as directed to affected area twice a day; Use of SPF is recommended. (Patient not taking: Reported on 11/13/2023), Disp: 45 g, Rfl: 1   NON FORMULARY, Pt uses a c-pap nightly, Disp: , Rfl:    ondansetron  (ZOFRAN -ODT) 8 MG disintegrating tablet, Dissolve 1 tablet under the tongue as needed (Patient not taking: Reported on 04/29/2024), Disp: 12 tablet, Rfl: 1   progesterone  (PROMETRIUM ) 100 MG capsule, Take 1 capsule (100 mg total) by mouth at bedtime., Disp: 90 capsule, Rfl: 2   zolpidem  (AMBIEN ) 10 MG tablet, Take 1/2 - 1 tablet by mouth at bedtime as needed for sleep., Disp: 30 tablet, Rfl: 5 Medication Side Effects: none  Family Medical/ Social History: Changes?  No   MENTAL HEALTH EXAM:  There were no vitals taken for this visit.There is  no height or weight on file to calculate BMI.  General Appearance: Casual, Neat and Well Groomed  Eye Contact:  Good  Speech:  Clear and Coherent and Normal Rate  Volume:  Normal  Mood:  sad  Affect:  Congruent  Thought Process:  Goal Directed and Descriptions of Associations: Circumstantial  Orientation:  Full (Time, Place, and Person)  Thought Content: Logical   Suicidal Thoughts:  No  Homicidal Thoughts:  No  Memory:  WNL  Judgement:  Good  Insight:  Good  Psychomotor Activity:  Normal  Concentration:  Concentration: Good and Attention  Span: Good  Recall:  Good  Fund of Knowledge: Good  Language: Good  Assets:  Communication Skills Desire for Improvement Financial Resources/Insurance Housing Resilience Social Support Transportation Vocational/Educational  ADL's:  Intact  Cognition: WNL  Prognosis:  Good   GeneSight results on chart, scanned under labs.  Reviewed labs from 11-13-2023.  ECT-MADRS    Flowsheet Row Office Visit from 02/04/2023 in [facility] Office Visit from 09[facility]22/2021 in [facility] Office Visit from 08/22/2021 in [facility]  MADRS Total Score 40 4 36   GAD-7    Flowsheet Row Office Visit from 08/07/2023 in [facility] Office Visit from 07/03/2023 in [facility] Office Visit from 05/22/2023 in [facility] Office Visit from 05/05/2021 in [facility] Office Visit from 12/29/2020 in [facility]  Total GAD-7 Score 2 3 6 1 1    PHQ2-9    Flowsheet Row Office Visit from 08/07/2023 in [facility] Office Visit from 07/03/2023 in [facility] Office Visit from 05/22/2023 in [facility] Office Visit from 05/05/2021 in [facility] Office Visit from 12/29/2020 in [facility]  PHQ-2 Total Score 4 1 4 2 1   PHQ-9 Total Score 10 2 9 4 3    Flowsheet Row Admission (Discharged) from 03/11/2023 in [facility] ED from 05/30/2021 in [facility]  C-SSRS RISK CATEGORY No Risk No Risk   DIAGNOSES:    ICD-10-CM   1. Treatment-resistant depression  F32.9     2. Generalized anxiety disorder  F41.1     3. Malaise and fatigue  R53.81    R53.83      4. Insomnia, unspecified type  G47.00       Receiving Psychotherapy: No     RECOMMENDATIONS:  PDMP was reviewed.  Xanax  filled 01/29/2024. Ambien  filled 07/30/2022.   I provided approximately  20 minutes of face to face time during this encounter, including time spent before and after the visit in records review, medical decision making, counseling pertinent to today's visit, and charting.   Disc options for depression. I recommend increasing Latuda . Pros and cons disc and she accepts.  Other options are retrying mood therapy lamp, TMS but she's unable to do that b/c work schedule, Spravato, unable b/c work although interested. Also we could add a low dose Wellbutrin  to the Auvelity  but I'm concerned it will increase anxiety.   We didn't disc today but consider Elavil or Prmipexole.  I hope to not need to add another drug if possible.   Continue Xanax  0.5 mg, 1 p.o. twice daily as needed. Continue Auvelity  45/105 mg, 1 p.o. twice daily.  Increase Latuda  to 60  mg w/ supper. Continue Viibryd  40 mg p.o. daily.   Continue Ambien  10 mg, 1/2-1 nightly as needed sleep. Continue vitamins as per med list.  Return in 6 weeks.   Verneita Cooks, PA-C  "

## 2024-04-30 ENCOUNTER — Other Ambulatory Visit: Payer: Self-pay

## 2024-04-30 ENCOUNTER — Other Ambulatory Visit (HOSPITAL_BASED_OUTPATIENT_CLINIC_OR_DEPARTMENT_OTHER): Payer: Self-pay

## 2024-05-26 ENCOUNTER — Encounter: Payer: Commercial Managed Care - PPO | Admitting: Family Medicine

## 2024-06-17 ENCOUNTER — Ambulatory Visit: Admitting: Physician Assistant

## 2024-09-25 ENCOUNTER — Encounter: Admitting: Family Medicine
